# Patient Record
Sex: Male | Born: 1960 | Race: White | Hispanic: No | Smoking: Former smoker
Health system: Southern US, Community
[De-identification: ages and names within clinical notes are randomized; demographics above are authoritative.]

## PROBLEM LIST (undated history)

## (undated) DIAGNOSIS — R0602 Shortness of breath: Secondary | ICD-10-CM

## (undated) DIAGNOSIS — E785 Hyperlipidemia, unspecified: Secondary | ICD-10-CM

## (undated) DIAGNOSIS — I209 Angina pectoris, unspecified: Secondary | ICD-10-CM

## (undated) DIAGNOSIS — M1990 Unspecified osteoarthritis, unspecified site: Secondary | ICD-10-CM

## (undated) DIAGNOSIS — G8929 Other chronic pain: Secondary | ICD-10-CM

## (undated) DIAGNOSIS — M545 Low back pain, unspecified: Secondary | ICD-10-CM

## (undated) DIAGNOSIS — N183 Chronic kidney disease, stage 3 unspecified: Secondary | ICD-10-CM

## (undated) DIAGNOSIS — Z8719 Personal history of other diseases of the digestive system: Secondary | ICD-10-CM

## (undated) DIAGNOSIS — K219 Gastro-esophageal reflux disease without esophagitis: Secondary | ICD-10-CM

## (undated) DIAGNOSIS — I10 Essential (primary) hypertension: Secondary | ICD-10-CM

## (undated) DIAGNOSIS — E119 Type 2 diabetes mellitus without complications: Secondary | ICD-10-CM

## (undated) DIAGNOSIS — R51 Headache: Secondary | ICD-10-CM

## (undated) HISTORY — PX: SHOULDER SURGERY: SHX246

## (undated) HISTORY — PX: ELBOW ARTHROPLASTY: SHX928

## (undated) HISTORY — PX: LUMBAR DISC SURGERY: SHX700

---

## 1998-01-24 ENCOUNTER — Inpatient Hospital Stay (HOSPITAL_COMMUNITY): Admission: EM | Admit: 1998-01-24 | Discharge: 1998-01-25 | Payer: Self-pay | Admitting: Emergency Medicine

## 1999-01-30 ENCOUNTER — Encounter: Payer: Self-pay | Admitting: Emergency Medicine

## 1999-01-30 ENCOUNTER — Emergency Department (HOSPITAL_COMMUNITY): Admission: EM | Admit: 1999-01-30 | Discharge: 1999-01-30 | Payer: Self-pay | Admitting: Emergency Medicine

## 1999-07-07 ENCOUNTER — Encounter: Payer: Self-pay | Admitting: Emergency Medicine

## 1999-07-07 ENCOUNTER — Emergency Department (HOSPITAL_COMMUNITY): Admission: EM | Admit: 1999-07-07 | Discharge: 1999-07-07 | Payer: Self-pay | Admitting: Emergency Medicine

## 1999-07-14 ENCOUNTER — Emergency Department (HOSPITAL_COMMUNITY): Admission: EM | Admit: 1999-07-14 | Discharge: 1999-07-14 | Payer: Self-pay | Admitting: Emergency Medicine

## 1999-07-15 ENCOUNTER — Encounter: Payer: Self-pay | Admitting: Emergency Medicine

## 1999-07-26 ENCOUNTER — Ambulatory Visit (HOSPITAL_COMMUNITY): Admission: RE | Admit: 1999-07-26 | Discharge: 1999-07-26 | Payer: Self-pay | Admitting: Gastroenterology

## 1999-07-26 ENCOUNTER — Encounter (INDEPENDENT_AMBULATORY_CARE_PROVIDER_SITE_OTHER): Payer: Self-pay | Admitting: Specialist

## 1999-08-02 ENCOUNTER — Inpatient Hospital Stay (HOSPITAL_COMMUNITY): Admission: EM | Admit: 1999-08-02 | Discharge: 1999-08-03 | Payer: Self-pay | Admitting: *Deleted

## 1999-08-02 ENCOUNTER — Encounter: Payer: Self-pay | Admitting: *Deleted

## 1999-08-08 ENCOUNTER — Encounter: Admission: RE | Admit: 1999-08-08 | Discharge: 1999-08-08 | Payer: Self-pay | Admitting: Family Medicine

## 1999-12-10 ENCOUNTER — Ambulatory Visit (HOSPITAL_COMMUNITY): Admission: RE | Admit: 1999-12-10 | Discharge: 1999-12-10 | Payer: Self-pay | Admitting: Gastroenterology

## 1999-12-17 ENCOUNTER — Encounter: Payer: Self-pay | Admitting: Emergency Medicine

## 1999-12-17 ENCOUNTER — Observation Stay (HOSPITAL_COMMUNITY): Admission: EM | Admit: 1999-12-17 | Discharge: 1999-12-18 | Payer: Self-pay | Admitting: Emergency Medicine

## 2001-09-10 ENCOUNTER — Emergency Department (HOSPITAL_COMMUNITY): Admission: EM | Admit: 2001-09-10 | Discharge: 2001-09-10 | Payer: Self-pay | Admitting: Emergency Medicine

## 2001-09-10 ENCOUNTER — Encounter: Payer: Self-pay | Admitting: Emergency Medicine

## 2001-12-06 ENCOUNTER — Encounter: Payer: Self-pay | Admitting: *Deleted

## 2001-12-06 ENCOUNTER — Emergency Department (HOSPITAL_COMMUNITY): Admission: EM | Admit: 2001-12-06 | Discharge: 2001-12-06 | Payer: Self-pay | Admitting: *Deleted

## 2003-04-18 ENCOUNTER — Emergency Department (HOSPITAL_COMMUNITY): Admission: EM | Admit: 2003-04-18 | Discharge: 2003-04-18 | Payer: Self-pay | Admitting: *Deleted

## 2003-04-18 ENCOUNTER — Encounter: Payer: Self-pay | Admitting: *Deleted

## 2003-04-19 ENCOUNTER — Encounter: Payer: Self-pay | Admitting: Emergency Medicine

## 2003-04-19 ENCOUNTER — Ambulatory Visit (HOSPITAL_COMMUNITY): Admission: RE | Admit: 2003-04-19 | Discharge: 2003-04-19 | Payer: Self-pay | Admitting: Emergency Medicine

## 2003-04-25 ENCOUNTER — Encounter: Payer: Self-pay | Admitting: Emergency Medicine

## 2003-04-25 ENCOUNTER — Observation Stay (HOSPITAL_COMMUNITY): Admission: EM | Admit: 2003-04-25 | Discharge: 2003-04-26 | Payer: Self-pay | Admitting: Emergency Medicine

## 2003-09-27 ENCOUNTER — Inpatient Hospital Stay (HOSPITAL_COMMUNITY): Admission: EM | Admit: 2003-09-27 | Discharge: 2003-09-28 | Payer: Self-pay | Admitting: Internal Medicine

## 2003-10-03 ENCOUNTER — Emergency Department (HOSPITAL_COMMUNITY): Admission: EM | Admit: 2003-10-03 | Discharge: 2003-10-03 | Payer: Self-pay | Admitting: Emergency Medicine

## 2003-11-28 ENCOUNTER — Emergency Department (HOSPITAL_COMMUNITY): Admission: EM | Admit: 2003-11-28 | Discharge: 2003-11-29 | Payer: Self-pay | Admitting: *Deleted

## 2004-02-07 ENCOUNTER — Inpatient Hospital Stay (HOSPITAL_COMMUNITY): Admission: EM | Admit: 2004-02-07 | Discharge: 2004-02-09 | Payer: Self-pay | Admitting: *Deleted

## 2004-11-20 ENCOUNTER — Encounter: Admission: RE | Admit: 2004-11-20 | Discharge: 2004-12-13 | Payer: Self-pay | Admitting: Orthopedic Surgery

## 2004-12-19 ENCOUNTER — Ambulatory Visit: Payer: Self-pay | Admitting: Cardiology

## 2005-01-03 ENCOUNTER — Ambulatory Visit: Payer: Self-pay | Admitting: Cardiology

## 2005-01-11 ENCOUNTER — Inpatient Hospital Stay (HOSPITAL_COMMUNITY): Admission: AD | Admit: 2005-01-11 | Discharge: 2005-01-15 | Payer: Self-pay | Admitting: Cardiology

## 2005-01-11 ENCOUNTER — Ambulatory Visit: Payer: Self-pay | Admitting: *Deleted

## 2005-01-29 ENCOUNTER — Ambulatory Visit: Payer: Self-pay | Admitting: Cardiology

## 2006-02-06 ENCOUNTER — Ambulatory Visit: Payer: Self-pay | Admitting: Cardiology

## 2006-09-23 ENCOUNTER — Encounter: Admission: RE | Admit: 2006-09-23 | Discharge: 2006-11-05 | Payer: Self-pay | Admitting: Orthopedic Surgery

## 2007-02-06 ENCOUNTER — Ambulatory Visit: Payer: Self-pay | Admitting: Cardiology

## 2007-12-07 ENCOUNTER — Ambulatory Visit: Payer: Self-pay | Admitting: Cardiology

## 2009-10-31 ENCOUNTER — Encounter: Admission: RE | Admit: 2009-10-31 | Discharge: 2010-01-29 | Payer: Self-pay | Admitting: Family Medicine

## 2010-02-08 ENCOUNTER — Observation Stay (HOSPITAL_COMMUNITY): Admission: RE | Admit: 2010-02-08 | Discharge: 2010-02-09 | Payer: Self-pay | Admitting: Specialist

## 2010-02-26 ENCOUNTER — Encounter: Admission: RE | Admit: 2010-02-26 | Discharge: 2010-05-27 | Payer: Self-pay | Admitting: Specialist

## 2010-07-18 ENCOUNTER — Ambulatory Visit: Payer: Self-pay | Admitting: Cardiology

## 2010-09-26 ENCOUNTER — Observation Stay (HOSPITAL_COMMUNITY)
Admission: RE | Admit: 2010-09-26 | Discharge: 2010-09-27 | Payer: Self-pay | Source: Home / Self Care | Attending: Specialist | Admitting: Specialist

## 2010-09-27 ENCOUNTER — Encounter (INDEPENDENT_AMBULATORY_CARE_PROVIDER_SITE_OTHER): Payer: Self-pay | Admitting: Specialist

## 2010-12-25 LAB — CBC
HCT: 38.1 % — ABNORMAL LOW (ref 39.0–52.0)
Hemoglobin: 13.4 g/dL (ref 13.0–17.0)
MCH: 31 pg (ref 26.0–34.0)
MCHC: 35.2 g/dL (ref 30.0–36.0)
MCV: 88.2 fL (ref 78.0–100.0)
RDW: 12.6 % (ref 11.5–15.5)

## 2010-12-25 LAB — URINALYSIS, ROUTINE W REFLEX MICROSCOPIC
Bilirubin Urine: NEGATIVE
Glucose, UA: NEGATIVE mg/dL
Hgb urine dipstick: NEGATIVE
Ketones, ur: NEGATIVE mg/dL
Protein, ur: NEGATIVE mg/dL
pH: 6 (ref 5.0–8.0)

## 2010-12-25 LAB — COMPREHENSIVE METABOLIC PANEL
ALT: 37 U/L (ref 0–53)
BUN: 16 mg/dL (ref 6–23)
CO2: 26 mEq/L (ref 19–32)
Calcium: 9.4 mg/dL (ref 8.4–10.5)
GFR calc non Af Amer: >60 mL/min (ref >60)
Glucose, Bld: 123 mg/dL — ABNORMAL HIGH (ref 70–99)
Total Protein: 7.1 g/dL (ref 6.0–8.3)

## 2010-12-25 LAB — PROTIME-INR
INR: 0.96 (ref 0.00–1.49)
Prothrombin Time: 13 seconds (ref 11.6–15.2)

## 2011-01-01 LAB — URINALYSIS, ROUTINE W REFLEX MICROSCOPIC
Nitrite: NEGATIVE
Specific Gravity, Urine: 1.024 (ref 1.005–1.030)
pH: 7 (ref 5.0–8.0)

## 2011-01-01 LAB — COMPREHENSIVE METABOLIC PANEL
AST: 22 U/L (ref 0–37)
Albumin: 3.8 g/dL (ref 3.5–5.2)
Calcium: 9.2 mg/dL (ref 8.4–10.5)
Chloride: 107 mEq/L (ref 96–112)
Creatinine, Ser: 0.84 mg/dL (ref 0.4–1.5)
GFR calc Af Amer: >60 mL/min (ref >60)
Sodium: 140 mEq/L (ref 135–145)
Total Bilirubin: 0.6 mg/dL (ref 0.3–1.2)

## 2011-01-01 LAB — BASIC METABOLIC PANEL
GFR calc Af Amer: >60 mL/min (ref >60)
GFR calc non Af Amer: >60 mL/min (ref >60)
Potassium: 4.4 mEq/L (ref 3.5–5.1)
Sodium: 138 mEq/L (ref 135–145)

## 2011-01-01 LAB — CBC
HCT: 36.1 % — ABNORMAL LOW (ref 39.0–52.0)
Hemoglobin: 12.3 g/dL — ABNORMAL LOW (ref 13.0–17.0)
MCV: 91.5 fL (ref 78.0–100.0)
Platelets: 211 10*3/uL (ref 150–400)
Platelets: 242 10*3/uL (ref 150–400)
WBC: 16.5 10*3/uL — ABNORMAL HIGH (ref 4.0–10.5)
WBC: 7.9 10*3/uL (ref 4.0–10.5)

## 2011-01-01 LAB — APTT: aPTT: 30 seconds (ref 24–37)

## 2011-01-30 NOTE — Discharge Summary (Signed)
  [name], [name]             ACCOUNT NO.:  [id number]  MEDICAL RECORD NO.:  [id number]          PATIENT TYPE:  OBV  LOCATION:  1605                         FACILITY:  [facility]  PHYSICIAN:  Jene Every, M.D.    DATE OF BIRTH:  1961/06/21  DATE OF ADMISSION:  09/26/2010 DATE OF DISCHARGE:  09/27/2010                              DISCHARGE SUMMARY   ADMISSION DIAGNOSIS:  Recurrent spinal stenosis, L5-S1, with foraminal cyst.  DISCHARGE DIAGNOSIS:  Recurrent spinal stenosis, L5-S1, with foraminal cyst, status post redo decompression at L5-S1 with removal of synovial cyst.  PROCEDURE:  The patient was taken to OR, underwent redo decompression at L5-S1, left with foraminotomy at L5-S1, decompression of synovial cyst at L5-S1 with foraminotomy of L5.  SURGEON:  Jene Every, M.D.  ASSISTANT:  Roma Schanz, P.A.-C.  ANESTHESIA:  General.  COMPLICATIONS:  None.  HOSPITAL COURSE:  Uneventful.  FOLLOWUP:  The patient follow up with Dr. Shelle Iron in approximately 10-14 days postoperatively.  ACTIVITIES:  Walk as tolerated using back precautions.  MEDICATIONS:  As per med rec sheet.  CONDITION ON DISCHARGE:  Stable.  FINAL DIAGNOSIS:  Doing well, status post recurrent redo decompression at L5-S1.     Roma Schanz, P.A.   ______________________________ Jene Every, M.D.    CS/MEDQ  D:  01/22/2011  T:  01/23/2011  Job:  [id number]  Electronically Signed by Roma Schanz P.A. on 01/23/2011 04:56:46 PM Electronically Signed by Jene Every M.D. on 01/30/2011 12:04:21 PM

## 2011-03-01 NOTE — Consult Note (Signed)
NAME:  [name], [name]                       ACCOUNT NO.:  [id number]   MEDICAL RECORD NO.:  [id number]                   PATIENT TYPE:  INP   LOCATION:  A206                                 FACILITY:  [facility]   PHYSICIAN:  Kofi A. Gerilyn Pilgrim, M.D.              DATE OF BIRTH:  Jan 06, 1961   DATE OF CONSULTATION:  02/08/2004  DATE OF DISCHARGE:                                   CONSULTATION   REASON FOR CONSULTATION:  Syncope.   IMPRESSION:  Syncopal episode, etiology unclear.  The time period is  suggestive of a seizure.  However, he has no other associated symptoms or  signs of a seizure.  Cardiovascular event is much less likely.  When it is  all said and done, this patient's evaluation may be unremarkable.   RECOMMENDATIONS:  Recommendations are that he have an MRI and EEG that have  been ordered and I agree with these.  I will follow up the results of these.   HISTORY OF PRESENT ILLNESS:  This is a 50 year old Caucasian male who  apparently has had problems with gastroesophageal reflux disease and chest  pain in the past.  He has been cathed in December 2004.  This was normal.  The patient apparently wakes up at 4 a.m. to go to work.  Yesterday he awoke  at his usual time of 4 a.m. and went to the kitchen and headed to the  bathroom.  He bent over to pick up an object on the floor and apparently he  passed out.  He regained consciousness several minutes later.  The time  period that elapsed was at least 30 minutes as he woke up around 4 a.m. and  when he regained consciousness, it was something to 5 a.m.  There was no  associated neurological symptoms such as focal numbness, weakness,  dysarthria, dysphagia, or headache.  He reports having some mild chest pain,  2/10, before passing out, and then coming to the ER, a 4/10.  He did report  having some dizziness afterwards and a mild double vision.  The patient  denies any oral trauma or urinary incontinence.   PAST MEDICAL  HISTORY:  1. Hypertension.  2. Gastroesophageal reflux disease.  3. Noncardiac chest pain.   ADMISSION MEDICATIONS:  Levitra, Benicar, Prilosec.   ALLERGIES:  TEQUIN.   SOCIAL HISTORY:  He is divorced.  Has no children.  Lives by himself.  Works  at [employer]  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  Apparently has a history of smoking.  Quit about eight years ago.  Also quit  drinking.  Has a remote history of cocaine and marijuana use.   REVIEW OF SYSTEMS:  The patient's mother apparently died 05-02-2024of this  year.  He apparently found the mother and has been thinking about this since  then.  He wondered if this may be contributed to his current problems.  No  swelling  of the legs are apparent.  No shortness of breath.  No GI symptoms.   FAMILY HISTORY:  Mother had two strokes.  Also has siblings with  hypertension and heart disease.   PHYSICAL EXAMINATION:  GENERAL:  Mild to moderately overweight gentleman, in  no acute distress.  VITAL SIGNS:  He is afebrile.  Current temperature is 98.2.  Pulse 72,  respirations 20, blood pressure 140/73.  HEENT:  Poor dentition.  NECK:  Supple.  LUNGS:  Clear to auscultation bilaterally.  CARDIOVASCULAR:  Normal S1 and S2.  ABDOMEN:  Soft.  EXTREMITIES:  No edema.  He has hammer toe appearance on the right toes.  NEUROLOGIC:  The patient is awake, alert.  He converses well.  His speech,  language, and cognition are normal.  Cranial nerves II-XII reveal that the  right pupil is approximately 5 mm, left is 4 mm.  Both are reactive to light  and accommodation.  The difference between both pupils seemed to disappear  when all the lights are turned on in the room.  Visual fields are intact.  Extraocular movements show full.  Facial muscle strength is normal.  Tongue  is midline.  Uvula is midline.  Shoulder shrug is normal.  Motor examination  shows normal tone, bulk and strength.  There is no pronator drift.  Coordination  is intact.  Reflexes are +2 and symmetric.  Plantar reflexes  are both downgoing.  Sensory exam normal to temperature, light touch and  double simultaneous stimulation.  Gait is normal.   LABORATORY DATA:  PH 7.4, pCO2 40, pO2 106, FiO2 28%.  D-dimer 0.22.  sodium  137, potassium 4.4, chloride 105, CO2 28, glucose 115, BUN 10, creatinine  0.9, calcium 9.4.  CPK 180, 151, and 148.   Thank you for this consultation.      ___________________________________________                                            Perlie Gold Gerilyn Pilgrim, M.D.   KAD/MEDQ  D:  02/08/2004  T:  02/08/2004  Job:  [id number]

## 2011-03-01 NOTE — H&P (Signed)
[name], [name]             ACCOUNT NO.:  [id number]   MEDICAL RECORD NO.:  [id number]          PATIENT TYPE:  INP   LOCATION:  3742                         FACILITY:  [facility]   PHYSICIAN:  Oklee Bing, M.D.  DATE OF BIRTH:  November 05, 1960   DATE OF ADMISSION:  01/11/2005  DATE OF DISCHARGE:                                HISTORY & PHYSICAL   REFERRING PHYSICIAN:   PRIMARY CARE PHYSICIAN:  Dr.  Lia Hopping.   PRIMARY CARDIOLOGIST:  Learta Codding, M.D. [facility]   HISTORY OF PRESENT ILLNESS:  A 50 year old gentleman with a long history of  intermittent chest discomfort, referred for coronary angiography due to  recurrent symptoms.  [name]'s history dates to at least 2001, when  [facility] records indicate an ER visit and admission for chest  discomfort.  Those records are not currently available for review.  He has  had subsequent ER visits and hospitalizations, both at [facility]  and at [facility], the most recent earlier this month.  A stress  Myoview study was equivocal with a moderate-sized inferior and inferobasilar  defect that was predominantly fixed; a small degree of reversibility was  appreciated.  This was questioned as attenuation artifact versus inferior  scarring and ischemia.  Since discharge, the patient reports intermittent  left anterior chest discomfort.  This is of moderate severity and has a  pressure-like quality.  There is no significant radiation.  There is  intermittently associated dyspnea but no diaphoresis.  There is no  relationship to exertion.  Nitroglycerin appears efficacious at times  and  at other times not.  He took two sublingual nitroglycerin today without  affect, prompting him to summon EMS.  In the Emergency Department at  [facility], EKG and exam were unremarkable, as were cardiac markers,  and basic laboratory studies.  He had been told by Dr. Andee Lineman that he would  require catheterization for  recurrent symptoms and was sent here for that  procedure.  The patient believes he has had coronary angiography in the  past, but so far I am unable to locate those records.   PAST MEDICAL HISTORY:  Is otherwise notable for:  1.  Mild hypertension that has been easily controlled with medication.  2.  GERD, controlled with a PPI.  3.  Hiatal hernia.   The patient has never had surgery.   His only admissions have been for chest discomfort.   CURRENT MEDICATIONS:  1.  Prilosec 40 mg q.d.  2.  Benicar 40 mg q.d.  3.  Sublingual nitroglycerin is used p.r.n.  4.  [name] has been told to avoid aspirin in the past, due to his GI      disease.   SOCIAL HISTORY:  Divorced, works in a Ha[employer].  No tobacco  nor alcohol use for the past 8-9 years.   FAMILY HISTORY:  Negative for coronary disease.   REVIEW OF SYSTEMS:  Occasional headaches, some arthritis most notably in the  left elbow,  problems with depression.   PHYSICAL EXAMINATION:  GENERAL:  A mildly overweight gentleman who  appears  relatively healthy and has a flat affect.  VITAL SIGNS:  Blood pressure 130/85, heart rate 75 and regular, respirations  18, temperature 97.4, O2 saturation 99% on room air.  HEENT: Anicteric sclerae; normal lids and conjunctiva; pupils equal, round,  react to light.  NECK:  No jugular venous distension; normal carotid upstrokes without  bruits.  ENDOCRINE:  No thyromegaly.  HEMATOPOIETIC:  No adenopathy.  SKIN:  No significant lesions.  CHEST:  Normal lung expansion; clear to auscultation.  CARDIAC:  Normal first and second heart sounds; no murmurs or gallops  appreciated.  ABDOMEN:  Soft and nontender; no organomegaly; normal bowel sounds without  bruits; no masses.  EXTREMITIES:  Normal distal pulses; no edema.  NEUROMUSCULAR:  Symmetric strength and tone; normal cranial nerves.  MUSCULOSKELETAL:  No joint deformities.   EKG: Normal sinus rhythm.  J point elevation within  normal limits. When  compared to a prior tracing, of December 19, 2004, the ST segment elevation is  less prominent at present.   LABORATORY STUDIES:  Unremarkable including CBC, chemistry profile, cardiac  markers, and BNP.   IMPRESSION:  [name] has longstanding chest discomfort, although he  notes that his current symptoms are somewhat different than those he has had  in the past.  He has a questionable response to sublingual nitroglycerin.  His risk of significant coronary disease is relatively low, especially if he  had a negative coronary angiogram a few years ago.  We will continue to seek  those records.   1.  Initial treatment will be with aspirin at low-dose and metoprolol.  2.  If he has recurrent symptoms, IV nitroglycerin and perhaps      anticoagulants will be added.  3.  A lipid profile will be obtained along with other basic laboratory      tests.  4.  We will continue serial cardiac markers and EKGs.  5.  Coronary angiography is planned for Monday.      RR/MEDQ  D:  01/11/2005  T:  01/11/2005  Job:  [id number]

## 2011-03-01 NOTE — H&P (Signed)
NAME:  Russell, Strickland                       ACCOUNT NO.:  [id number]   MEDICAL RECORD NO.:  [id number]                   PATIENT TYPE:  OBV   LOCATION:  3709                                 FACILITY:  [facility]   PHYSICIAN:  Colleen Can. Deborah Chalk, M.D.            DATE OF BIRTH:  06-17-1961   DATE OF ADMISSION:  04/25/2003  DATE OF DISCHARGE:                                HISTORY & PHYSICAL   HISTORY OF PRESENT ILLNESS:  [name] is a 50 year old white man who  is admitted to [facility] for further evaluation of chest pain.   The patient presented to the emergency department after experiencing an  episode of chest pain which occurred at work.  He is employed as a Clinical cytogeneticist.  While at work, he experienced the onset of chest pain.  This was  described as a substernal sharp discomfort.  It radiated through to his back  and posterior neck.  It was associated with dyspnea and nausea but no  diaphoresis.  He had no exacerbating or ameliorating factors.  The chest  pain appears to be unrelated to position, activity, meals, or respirations.  It lasted for several hours and ultimately prompted his visit to the  emergency department.  His chest pain is largely resolved at this time.   The patient has no past history of cardiac disease, including no history of  myocardial infarction, coronary artery disease, congestive heart failure, or  arrhythmia.  He reportedly underwent cardiac catheterization approximately  five years ago for chest pain, but believed that it did not demonstrate any  coronary artery disease.  He has a history of hypertension and is on  medication.  There is no history of hyperlipidemia, diabetes mellitus, or  smoking.  His father suffered from coronary artery disease beginning in his  3s.  The patient also has a history of gastroesophageal reflux.   ALLERGIES:  The patient is reportedly allergic to [facility].   MEDICATIONS:  He takes Prilosec and an  unknown blood pressure medication.   SOCIAL HISTORY:  The patient is employed, as described above.  He lives with  his mother.  He is not married.   FAMILY HISTORY:  Family history is notable for the coronary artery disease  as described above.   REVIEW OF SYSTEMS:  Review of systems reveals no problems related to his  head, eyes, nose, mouth, throat, lungs, gastrointestinal system,  genitourinary system, or extremities.  There is no history of neurologic or  psychiatric disorder.  There is no history of fever, chills, or weight loss.   PHYSICAL EXAMINATION:  VITAL SIGNS:  Blood pressure 128/82.  Pulse 70 and  regular.  Respirations 20.  Temperature 97.8.  GENERAL:  The patient was an obese, middle-aged white man in no discomfort.  He was alert, oriented, appropriate, and responsive.  HEENT:  Head, eyes, nose, mouth, and throat were normal.  NECK:  The neck  was without thyromegaly or adenopathy.  Carotid pulses were  palpable bilaterally and without bruits.  CARDIAC:  Examination revealed a normal S1 and S2.  There was no S3, S4,  murmur, rub, or click.  Cardiac rhythm was regular.  No chest wall  tenderness was noted.  LUNGS:  The lungs were clear.  ABDOMEN:  The abdomen was soft and nontender.  There was no mass,  hepatosplenomegaly, bruit, distention, rebound, guarding, or rigidity.  Bowel sounds were normal.  RECTAL AND GENITAL:  Examinations were not performed as they were not  pertinent to the reason for acute care hospitalization.  EXTREMITIES:  The extremities were without edema, deviation, or deformity.  Radial and dorsalis pedal pulses were palpable bilaterally.  NEUROLOGIC:  Brief screening neurologic survey was unremarkable.   LABORATORY AND ACCESSORY CLINICAL DATA:  The electrocardiogram revealed  sinus bradycardia at a rate of 59 beats per minute.  An early repolarization  pattern was present.  The tracing was normal.   BUN was 16, creatinine 0.8, and potassium 4.1.   White count was 13.6 with a  hemoglobin of 13.6 and hematocrit of 38.3.  Initial CK was 604 with CK-MB of  6.4, relative index 1.1, and troponin 0.01.  The remaining studies were  pending at the time of this dictation.   IMPRESSION:  1. Chest pain, rule out coronary artery disease.  The chest pain was     described as sharp, substernal in location, and radiating through to the     back and posterior neck.  He reportedly underwent cardiac catheterization     approximately five years ago which did not demonstrate coronary artery     disease.  2. Hypertension.  3. Gastroesophageal reflux.   PLAN:  1. Telemetry.  2. Serial cardiac enzymes.  3. Aspirin.  4. Nitrol paste.  5. Lovenox.  6. Fasting lipid profile.  7. Chest and abdominal CT scans to exclude aortic aneurysm.  8. Additional evaluation per Dr. Colleen Can. Tenn[place].     Quita Skye. Waldon Reining, MD                   Colleen Can. Deborah Chalk, M.D.    MSC/MEDQ  D:  04/25/2003  T:  04/25/2003  Job:  [id number]

## 2011-03-01 NOTE — H&P (Signed)
NAME:  [name], [name]                       ACCOUNT NO.:  [id number]   MEDICAL RECORD NO.:  [id number]                   PATIENT TYPE:  INP   LOCATION:  A206                                 FACILITY:  [facility]   PHYSICIAN:  Vania Rea, M.D.              DATE OF BIRTH:  Mar 31, 1961   DATE OF ADMISSION:  02/07/2004  DATE OF DISCHARGE:                                HISTORY & PHYSICAL   PRIMARY CARE PHYSICIAN:  Dr. Lia Hopping.   CHIEF COMPLAINT:  Fainting and chest pain this morning.   HISTORY OF PRESENT ILLNESS:  This is a 50 year old Caucasian gentleman with  a history of hypertension, GERD, obesity, questionable history of  depression, who had a normal cardiac catheterization in December 2004 for  chest pain.  The patient was in his baseline state of health but woke up at  about 4 o'clock this morning, his usual time, and did not feel quite right,  had a burning sensation in his chest, 2/10 in intensity, nevertheless, went  to the kitchen, got coffee and came back about 5 minutes later and  apparently fainted, said when he woke up he looked at the clock and it was  4:40, so he feels he was unconscious for about 35 minutes.  When he woke up,  he still did not feel quite right.  The chest discomfort was still there,  still about 2/10, and not pressing or radiating; no sweating or shortness of  breath, but just discomfort.  The patient, nevertheless, got into his truck  and started to drive to work from [place].  On the way to work,  he felt progressively unwell and decided to turn around and come to the  emergency room here at [facility].  On arrival at [facility], he said he  continued to feel sick and the pain was now a 4/10.  In the emergency room,  he received sublingual nitroglycerin and it relieved the pain.   The patient uses Levitra but the last use was about 1 or 2 months ago.  The  patient takes his blood pressure medications regularly but did not take  them  this morning.  He denies any history of dizziness prior to this incident or  any incidence of sweating.  He had no fever, cough or cold; no frequency or  dysuria.   He does not usually have chest pains or shortness of breath.  He usually  does fair physical work and in fact, was in an exercise program up to about  a month ago and used to walk 4 miles very easily.   PAST MEDICAL HISTORY:  1. Hypertension.  2. Possible GERD.  3. Obesity.  4. History of noncardiac chest pain, MI ruled out by cardiac cath in     December 2004.   MEDICATIONS:  1. Prilosec 40 mg daily.  2. Benicar 40 mg daily.  3. Levitra episodically.  ALLERGIES:  TEQUIN.   SOCIAL HISTORY:  He is divorced with no children, lives alone, works at  [employer], says he does a lot of heavy lifting.  He quit smoking and drinking about 8 years ago but used to do that very  heavily when he did it.  Very remote history of marijuana and cocaine use.   FAMILY HISTORY:  His mother died on 02-11-04 and he was the one who  found her in her home and he is wondering if the stress of that could be  having an effect on him now.  His mother, before she died, had diabetes  mellitus, hypertension and she had a stroke twice; his mother was age 47 at  death.  His father died at the age of 31 from a blood clot, source unknown  and location unknown.  He has 6 siblings with hypertension and heart disease  but he is not sure who has what.   REVIEW OF SYSTEMS:  Denies headache except as a reaction to the  nitroglycerin he received in the emergency room.  Denies any focal weakness  or any history of fainting other than as described in the HPI.  No glaucoma  or cataracts, but he does wear glasses.  No dental problems.  No thyroid  problems or diabetes.  He does not usually have chest pain, shortness of  breath or swelling of the legs.  No orthopnea or PND.  No frequency, dysuria  or hematuria.  No nausea,  vomiting or diarrhea.  He occasionally has rectal  burning which is relieved by Preparation H, as advised by his primary care  physician.   PHYSICAL EXAM:  GENERAL:  This is an apprehensive-looking middle-aged  Caucasian man lying in bed, very well-nourished and well-set.  VITALS:  His temperature is 97.0.  His blood pressure is 101/56; he has not  taken his blood pressure medication this morning.  His pulse as we interview  him falls as low at 44, ranges from 44-56.  He is experiencing no pain.  He  is saturating at 97% on room air and 100% on 2 L.  HEENT:  His pupils are 3-4 mm, round, equal and reactive to light.  His  mucous membranes are pink.  He is anicteric.  He is not dehydrated.  Oral  cavity:  There is no oral Candida.  There is no oral rash.  There are no  dental caries; his teeth are well-cared for.  NECK:  There is no lymphadenopathy and no jugular venous distention.  CHEST:  His chest is clear to auscultation bilaterally.  CARDIOVASCULAR:  His cardiovascular system is regular rhythm, no murmurs, no  gallops.  ABDOMEN:  His abdomen is obese, soft and nontender.  EXTREMITIES:  He has a trace edema bilaterally.  Pulses are 2+ bilaterally.  There are no joint deformities and no joint tenderness.  There is no muscle  wasting.  CENTRAL NERVOUS SYSTEM:  His cranial nerves are grossly intact.  There is no  focal deficit.  His reflexes are equal bilaterally.   LABORATORIES:  His CBC is entirely normal; his white count is 6.7, his  hemoglobin is 14.9, his platelet count is 247,000, his RDW is 13; his  differential is normal.  His serum chemistry is completely normal; his  sodium is 137, potassium 4.4, chloride 105, CO2 28, glucose 115, BUN 10,  creatinine 0.9, calcium 9.4.  His beta natriuretic peptide is less than 30.  His first  set of cardiac enzymes:  CK total is 180, MB is 2.1 and troponin  is 0.02.   ASSESSMENT: 1. Syncope of unclear etiology.  2. Bradycardia.  3. Chest  pain of unclear etiology.  4. Gastroesophageal reflux disease.  5. Hypertension, controlled.  6. Recent significant death in the family.   PLAN:  Since this gentleman has had a negative cath within the past year, we  will admit him and rule out an acute MI with 3 sets of cardiac enzymes.  We  will get a 2-D echo and put him on telemetry to see if there is any cardiac  irregularity.  It is possible that these symptoms are the result of a  bradycardia, but it is more likely that the bradycardia is related to his  physical conditioning.  He is not on any medication to cause bradycardia,  but he, however, may have some conduction abnormality; we will get a  cardiology consult.  We will continue to treat him for his GERD, which is a  likely cause of burning of the stomach.  His pressure is rather low for  somebody who has not taken his blood pressure medication this morning.  We  will get orthostatics on him and be sure he is not having orthostasis as a  cause of his syncope, although it is unusual to be unconscious for 30  minutes as a result of orthostasis.     ___________________________________________                                         Vania Rea, M.D.   LC/MEDQ  D:  02/07/2004  T:  02/07/2004  Job:  [id number]

## 2011-03-01 NOTE — Discharge Summary (Signed)
NAME:  [name], [name]                       ACCOUNT NO.:  [id number]   MEDICAL RECORD NO.:  [id number]                   PATIENT TYPE:  OBV   LOCATION:  3709                                 FACILITY:  [facility]   PHYSICIAN:  Colleen Can. Deborah Chalk, M.D.            DATE OF BIRTH:  1961/03/31   DATE OF ADMISSION:  04/24/2003  DATE OF DISCHARGE:  04/26/2003                                 DISCHARGE SUMMARY   PRIMARY DISCHARGE DIAGNOSIS:  Chest pain with negative cardiac  catheterization.   SECONDARY DIAGNOSES:  1. Hypertension, currently controlled.  2. Gastroesophageal reflux disease, currently on Prilosec.   HISTORY OF PRESENT ILLNESS:  The patient is a 50 year old white male who  presents to the emergency room for evaluation of chest pain. 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 It was described as sharp and substernal. It radiated into the  back as well as the posterior neck and was associated with shortness of  breath and nausea but no diaphoresis. The pain appeared to be unrelated to  position, activity, meals, or respirations and lasted for several hours. His  pain was primarily resolved by the time of evaluation. She was subsequently  seen and evaluated by Dr. Gladys Damme and was admitted for further  evaluation.   Please see dictated history and physical for further patient presentation  and profile.   LABORATORY DATA ON ADMISSION:  His peak cardiac MB was 6.4 with a CK total  of 604, troponin Is were all negative. CBC showed a white count of 13,000,  hemoglobin 13, hematocrit 38, platelets 259.   Chemistries were normal except for glucose of 120, SGOT slightly elevated at  41, SGPT was 42, magnesium was 2.2, INR was 1.1.   Total cholesterol was 173, triglycerides 116, HDL 40, and LDL of 110.   EKG showed no acute changes.   CT of the abdomen showed no evidence of abdominal aortic aneurysm or  dissection. The heart,  mediastinum, pulmonary vessels appeared to be normal.  There was no pericardial effusion, no pleural effusion. The lungs were  clear. There was no evidence of thoracic aortic aneurysm or dissection as  well. The bony thorax did show hypertrophic spurring but no acute fracture  or metastatic disease. There was no evidence of obstruction of the bowel. No  free fluid or air was seen within the pelvis.   HOSPITAL COURSE:  The patient was admitted. Cardiac enzymes were noted to be  slightly elevated. We proceeded on with repeat cardiac catheterization the  following day which was normal. That procedure was tolerated well without  any noted complications. He was subsequently transferred back to telemetry,  and today on April 26, 2003, he is doing well without complaint. His heart  rate did drop to 39 which was nonsustained while the patient was asleep. He  was asymptomatic when awakened. He is  felt to be a stable candidate for  discharge with further evaluation on an outpatient basis.   DISCHARGE CONDITION:  Stable.   DISCHARGE MEDICATIONS:  He is to continue with Prilosec as he was taking  before as well as blood pressure medicines. He may use Advil and Tylenol for  chest discomfort.   He is not to return to work until July 19, use an ice pack to the groin if  needed, and otherwise will have him follow up with his primary care  physician in approximately one to two weeks. He will see Dr. Deborah Chalk on an  as needed basis.     Juanell Fairly C. Earl Gala, N.P.                 Colleen Can. Deborah Chalk, M.D.    LCO/MEDQ  D:  04/26/2003  T:  04/26/2003  Job:  [id number]   cc:   [name], M.D.  [name], [name]    cc:   [name], M.D.  [place], [name]

## 2011-03-01 NOTE — Discharge Summary (Signed)
[name], [name]             ACCOUNT NO.:  [id number]   MEDICAL RECORD NO.:  [id number]          PATIENT TYPE:  INP   LOCATION:  3742                         FACILITY:  [facility]   PHYSICIAN:  Olga Millers, M.D. LHCDATE OF BIRTH:  [birth year]   DATE OF ADMISSION:  01/11/2005  DATE OF DISCHARGE:  01/15/2005                                 DISCHARGE SUMMARY   PROCEDURES:  1.  Cardiac catheterization.  2.  Coronary arteriogram.  3.  Left ventriculogram.   DISCHARGE DIAGNOSES:  1.  Chest pain, no significant coronary artery disease by catheterization.  2.  Hypertension.  3.  Bradycardia secondary to beta blockers.  4.  Status post lipid profile, with a total cholesterol of 129,      triglycerides 75, HDL 34, LDL 75.  5.  Gastroesophageal reflux disease symptoms.  6.  History of hiatal hernia.  7.  Remote history of alcohol and tobacco use, stopped almost 10 years ago.   HOSPITAL COURSE:  [name] is a 50 year old male with a history of  cardiac catheterization in 2004 for chest pain that showed no coronary  artery disease.  He had been evaluated by Dr. Andee Lineman for chest pain and was  referred for coronary angiography.   He continued to have intermittent chest pain, and a cardiac catheterization  was performed on January 15, 2005.  The cardiac catheterization showed no  coronary artery disease and a normal EF.   Postcatheterization, [name] was without chest pain or shortness of  breath.  His groin was without oozing or hematoma.  He is tentatively  considered stable for discharge on January 15, 2005 if his groin remains stable  with ambulation.   DISCHARGE INSTRUCTIONS:  1.  His activity level is to include no strenuous activity for two days.  2.  He is to call the office for problems with the catheterization site.  3.  He is to stick to a lowfat diet.  4.  He is to follow up with Dr. Andee Lineman on January 25, 2005 at 1:00 p.m.  5.  He is to follow up with his family physician  as needed.   DISCHARGE MEDICATIONS:  1.  Benicar 40 mg daily.  2.  Prilosec 40 mg daily.  3.  Coated aspirin 81 mg daily.      RB/MEDQ  D:  01/15/2005  T:  01/15/2005  Job:  [id number]   cc:   [facility]  [facility]   __________, Judie Petit.D.

## 2011-03-01 NOTE — Procedures (Signed)
[place]. [facility]  Patient:    [name], [name]                    MRN: [id number] Adm. Date:  [id number] Disc. Date: [id number] Attending:  Nelda Marseille CC:         Petra Kuba, M.D.             Monica Becton, M.D.             Lorne Skeens. Hoxworth, M.D.                           Procedure Report  PROCEDURE:  24-hour pH.  INDICATIONS:  Patient with atypical chest pain and upper tract symptoms.  Want o evaluate reflux in consideration of surgical options.  INFORMED CONSENT:  Consent was signed after risks, benefits, methods, and options were thoroughly discussed with [name] before the procedure, as well as in the office in the past.  DESCRIPTION OF PROCEDURE:  The pH probe was placed by the endoscopy nurse in the customary fashion, and the computer generated report was submitted to me for my  interpretation.  The patient had, based on the Johnson-DeMeester score, an overall normal amount of reflux based on a total score of 12.3 with normal being less than 22.  The only parameter that he exceeded the normal was in total episodes in 24-hour where he had 63.  Normal is less than 50.  His longest episode was 4.5 minutes with normal less than 9.2.  None were greater than 5 minutes.  Percent f time with reflux was 3% with normal being less than 4.2.  Percent of time reflux lying flat was 1.2, which is the upper limits of normal, and percent of time reflux upright was 3.6 with the percent being less than 6.3.  His symptoms of heartburn and eating did not coordinate with reflux.  However, some of his coughing and belching did seem to coordinate with some reflux episodes.  ASSESSMENT:  Essentially normal 24-hour pH with only a slightly greater than normal episodes of reflux, but no significant other findings.  PLAN:  Will re-discuss these findings and the manometry findings with Dr. Johna Sheriff and decide whether we should  continue to follow [name] clinically on medicines or proceed with surgical options. DD:  12/12/99 TD:  12/12/99 Job: [id number]

## 2011-03-01 NOTE — Procedures (Signed)
NAME:  [name], [name]                       ACCOUNT NO.:  [id number]   MEDICAL RECORD NO.:  [id number]                   PATIENT TYPE:  INP   LOCATION:  A206                                 FACILITY:  [facility]   PHYSICIAN:  Edward L. Juanetta Gosling, M.D.             DATE OF BIRTH:  [birth year]   DATE OF PROCEDURE:  DATE OF DISCHARGE:                                EKG INTERPRETATION   TIME:  February 08, 2004, at 5:01.   FINDINGS:  1. The rhythm is sinus bradycardia with a rate in the 40's.  2. Since the previous tracing of 15:21 on February 07, 2004, there are now     decreased R waves in V1, V2 and V3, and T wave inversion in V1 and V2, as     well as ST elevation in 2, 3 and aVF.  These all may indicate an acute     infarction, and clinical correlation is suggested.   IMPRESSION:  Abnormal electrocardiogram.      ___________________________________________                                            Oneal Deputy. Juanetta Gosling, M.D.   ELH/MEDQ  D:  02/08/2004  T:  02/09/2004  Job:  [id number]

## 2011-03-01 NOTE — Procedures (Signed)
[name]. [facility]  Patient:    [name], [name]                    MRN: [id number] Proc. Date: 12/10/99 Adm. Date:  [id number] Disc. Date: [id number] Attending:  Nelda Marseille CC:         Petra Kuba, M.D.             Monica Becton, M.D.             Lorne Skeens. Hoxworth, M.D.                           Procedure Report  PROCEDURE:  Manometry.  INDICATIONS:   Patient with a hiatal hernia ______ symptoms.  Want to evaluate or surgical options.  INFORMED CONSENT:  Consent was signed after risks, benefits, methods, and options were thoroughly discussed with [name] before the procedure on December 10, 1999, as well as in the office in the past.  DESCRIPTION OF PROCEDURE:  The patient manometry probe was placed by the endoscopy nurse in the customary fashion on December 10, 1999, and the computer generated a report, and it was submitted to me for my interpretation.  MANOMETRY REPORT: 1. Upper esophageal sphincter revealed normal pressures.  Slightly short duration,    but normal residual pressures in seemingly normal pharyngeal phase. 2. Esophageal body is pertinent for the lower part of the esophageal body had    normal pressures, slight short duration with normal peristalses and normal    velocity.  The upper part of the esophagus had overall normal pressures, again,    a slight duration, but normal peristalses, velocity, and the esophageal body had    normal wave forms. 3. Lower esophageal sphincter had normal pressures, normal residual pressures, nd    77% relaxation, which was adequate.  IMPRESSION:  Normal manometry, except for slight short durations on all wave forms.  PLAN:  Okay for surgical options, however, will discuss with Dr. Johna Sheriff, and please see 24-hour dictation for other workup and plans. DD:  12/12/99 TD:  12/12/99 Job: [id number]

## 2011-03-01 NOTE — Cardiovascular Report (Signed)
   NAME:  [name], [name]                       ACCOUNT NO.:  [id number]   MEDICAL RECORD NO.:  [id number]                   PATIENT TYPE:  OBV   LOCATION:  3709                                 FACILITY:  [facility]   PHYSICIAN:  Colleen Can. Deborah Chalk, M.D.            DATE OF BIRTH:  02/16/1961   DATE OF PROCEDURE:  04/25/2003  DATE OF DISCHARGE:  04/26/2003                              CARDIAC CATHETERIZATION   HISTORY:  [name] is a 50 year old male who presents with substernal  chest pain with dyspnea and nausea that occurred while at work.  It lasted  for several hours.  He had a slight rise in CK-MB, but troponins were  negative.   PROCEDURE:  Left heart catheterization with selective coronary angiography,  left ventricular angiography.   TYPE AND SITE OF ENTRY:  Percutaneous, right femoral artery. (Perclose not  appropriate because of side branch location).   CATHETERS:  6 French 4 curved Judkins right and left coronary catheters; 6  French pigtail ventriculographic catheter.   CONTRAST MATERIAL:  Omnipaque.   MEDICATIONS GIVEN PRIOR TO PROCEDURE:  Valium 10 mg p.o.   MEDICATIONS GIVEN DURING THE PROCEDURE:  Versed 3 mg IV.   COMMENTS:  The patient tolerated the procedure well.   HEMODYNAMIC DATA:  1. The aortic pressure was 130/70.  2. LV was 113/14.  3. LV aortic pressure 117/73.  4. There was no aortic valve gradient noted on pullback.   ANGIOGRAPHIC DATA:  The left ventricular angiogram was performed in the RAO  position.  Overall cardiac size and silhouette was normal.  The global left  ventricular ejection fraction was 55-60%.  The regional wall motion was  normal.  There was no mitral regurgitation.   CORONARY ARTERIES:  1. Right coronary artery:  The right coronary artery is a moderate size     dominant vessel.  It is normal.  2. Left main coronary artery:  Left main coronary artery is normal.  3. Left anterior descending wraps around the apex.  It is  normal.  4. Intermediate coronary:  There is a moderately large intermediate     coronary.  It is normal.  5. Left circumflex:  The left circumflex continues to the posterior lateral     wall.  It is normal.    OVERALL IMPRESSION:  1. Normal left ventricular systolic function.  2. Normal coronary arteries.                                               Colleen Can. Deborah Chalk, M.D.    SNT/MEDQ  D:  04/25/2003  T:  04/26/2003  Job:  [id number]

## 2011-03-01 NOTE — Procedures (Signed)
NAME:  GERMAN, MANKE                       ACCOUNT NO.:  [id number]   MEDICAL RECORD NO.:  [id number]                   PATIENT TYPE:  INP   LOCATION:  A206                                 FACILITY:  [facility]   PHYSICIAN:  [place] Bing, M.D.               DATE OF BIRTH:  1960/12/24   DATE OF PROCEDURE:  DATE OF DISCHARGE:                                  ECHOCARDIOGRAM   CLINICAL DATA:  A 50 year old gentleman with chest pain and syncope.   M-MODE:  1. Aorta 3.4.  2. Left atrium 4.3.  3. Septum 1.3.  4. Posterior wall 0.9.  5. LV diastole 5.4.  6. LV systole 3.9.   TECHNICAL QUALITY:  1. Adequate echocardiographic study.  2. Normal left atrium, right atrium and right ventricle.  3. Normal aortic valve.  4. Delicate mitral valve; flat coaptation without definite prolapse.  5. Normal tricuspid valve; trivial regurgitation.  6. Normal pulmonic valve.  7. Left ventricular size at the upper limit of normal; no LVH.  8. Normal regional and global LV systolic function.  9. Normal Doppler examination.      ___________________________________________                                            Crocker Bing, M.D.   RR/MEDQ  D:  02/07/2004  T:  02/07/2004  Job:  [id number]

## 2011-03-01 NOTE — Procedures (Signed)
NAME:  [name], [name]                       ACCOUNT NO.:  [id number]   MEDICAL RECORD NO.:  [id number]                   PATIENT TYPE:  INP   LOCATION:  A206                                 FACILITY:  [facility]   PHYSICIAN:  Kofi A. Gerilyn Pilgrim, M.D.              DATE OF BIRTH:  1961/04/21   DATE OF PROCEDURE:  DATE OF DISCHARGE:                                EEG INTERPRETATION   HISTORY:  This is a 50 year old gentleman who had a syncopal episode that is  suspicious for seizure.   ANALYSIS:  This is a 16-channel recording.  It is conducted for  approximately 20 minutes.  There is a posterior rhythm of 9-1/2 Hz  bilaterally which attenuates with eye opening.  There is Beta activity seen  un the frontal areas.  Awake and sleep activities are seen.  Stage 2 sleep  with K-complexes and sleep spindles are observed.  Photic stimulation and  hyperventilation does not elicit any abnormal responses.  There is no focal  slowing, lateralized slowing, or epileptiform activity seen.   IMPRESSION:  This is a normal recording of awake and sleep states.      ___________________________________________                                            Perlie Gold Gerilyn Pilgrim, M.D.   KAD/MEDQ  D:  02/09/2004  T:  02/09/2004  Job:  [id number]

## 2011-03-01 NOTE — Group Therapy Note (Signed)
NAME:  JOHNSON, ARIZOLA                       ACCOUNT NO.:  [id number]   MEDICAL RECORD NO.:  [id number]                   PATIENT TYPE:  INP   LOCATION:  A206                                 FACILITY:  [facility]   PHYSICIAN:  Vania Rea, M.D.              DATE OF BIRTH:  07-22-1961   DATE OF PROCEDURE:  02/08/2004  DATE OF DISCHARGE:                                   PROGRESS NOTE   SUBJECTIVE:  Still feels dizziness episodically, very concerned about his  fainting, very concerned that if he should be discharged and he is not able  to drive his whole life will fall apart.   OBJECTIVE:  Temperature 97.5, pulse 51, respirations 20, blood pressure  120/66.  Orthostatics:  It is noted that from sitting to standing his pulse  changes from 55 to 88 although there is no significant change in his  diastolic or systolic pressures and there is no associated dizziness.  HEENT:  His pupils are equal, round, and reactive to light.  His chest is  clear to auscultation.  His cardiovascular system is a regular rhythm with  no murmur.  His abdomen is soft and nontender.  His central nervous system  exam:  There is no cranial nerve deficit and no focal deficit.  His  echocardiogram has been read as normal.  The cardiology service has seen him  and has ruled any cardiac cause for his 20- to 35-minute syncope.  He has  had no arrhythmia on the monitor.  His ABG this morning is normal with a pH  of 7.4, a PO2 of 106, a PCO2 of 40, with 98% saturation on room air.  His  hemoglobin is 15 today; it was 14.9 yesterday.  There is therefore no  evidence of bleeding.   ASSESSMENT:  Syncope of unclear etiology.   PLAN:  We will get a neurology consult.  We will get an EEG before  discharging him.  This can be done in the morning because we will have to  stop him from driving unless we rule out seizures.  We will also get an MRI  for completeness.      ___________________________________________                                     Vania Rea, M.D.   LC/MEDQ  D:  02/08/2004  T:  02/08/2004  Job:  [id number]

## 2011-03-01 NOTE — Discharge Summary (Signed)
NAME:  [name], [name]                       ACCOUNT NO.:  [id number]   MEDICAL RECORD NO.:  [id number]                   PATIENT TYPE:  INP   LOCATION:  A206                                 FACILITY:  [facility]   PHYSICIAN:  Vania Rea, M.D.              DATE OF BIRTH:  [birth year]   DATE OF ADMISSION:  02/07/2004  DATE OF DISCHARGE:  02/09/2004                                 DISCHARGE SUMMARY   PRIMARY CARE PHYSICIAN:  Dr. _________________.   DISCHARGE DIAGNOSES:  1. Syncope of unclear etiology.  2. Bradycardia, probably physiologic.  3. Chest pain, myocardial infarction ruled out.  4. Gastroesophageal reflux disease.  5. Hypertension, controlled.   DISPOSITION:  Discharged home.   CONDITION ON DISCHARGE:  Stable.   DISCHARGE MEDICATIONS:  1. Benicar 40 mg daily.  2. Prilosec 40 mg daily.   HOSPITAL COURSE:  Please refer to the history and physical of February 07, 2004.  This is a 50 year old man who came to the emergency room with a vague  history of low-grade chest pain and discomfort, and a history of fainting  with unconsciousness lasting about 1/2 hour.  The patient was evaluated by  the cardiology service, had an echo, and no cardiac cause could be found for  this fainting episode.  Myocardial infarction was ruled out by enzymes and  EKG, and the patient had a normal catheterization less than a year ago.  The  patient was evaluated by the neurology service and had an EEG today.  The  patient has been walking around while in the hospital and has had no  syncopal episodes.  The patient has last changes in his pulse on sitting and  standing, up to 30 units change, however, the patient is asymptomatic and  may therefore have ____________.  The patient is being discharged with  advice that he is not to drive until the EEG has been read.   At the time of discharge, it is being noted for the first time the patient  requested certificate from the staff stating that he  could not attend work  because of his hospitalization, and one wonders if this is any way related  to this episode of syncope.   FOLLOWUP:  Follow up with primary care physician, Dr. _______________.  1. Follow up with neurologist, Dr. Gerilyn Pilgrim.   SPECIAL INSTRUCTIONS:  1. The patient is not to drive until he has seen the neurologist and he has     been cleared for driving.  2. The patient is to return to the emergency room for any further episodes     of syncope.     ___________________________________________                                         Vania Rea, M.D.   LC/MEDQ  D:  02/09/2004  T:  02/09/2004  Job:  [id number]

## 2011-03-01 NOTE — Cardiovascular Report (Signed)
Russell, Strickland             ACCOUNT NO.:  [id number]   MEDICAL RECORD NO.:  [id number]          PATIENT TYPE:  INP   LOCATION:  3742                         FACILITY:  [facility]   PHYSICIAN:  Carole Binning, M.D. LHCDATE OF BIRTH:  04-Jun-1961   DATE OF PROCEDURE:  01/15/2005  DATE OF DISCHARGE:                              CARDIAC CATHETERIZATION   PROCEDURE PERFORMED:  Left heart catheterization with coronary angiography  and left ventriculography.   INDICATIONS:  The patient is a 50 year old male who has had recurrent  episodes of substernal chest pain. A recent stress nuclear study was  equivocal for possibly inferior perfusion defect. Because of his recurrent  pain, he was admitted where he ruled out for myocardial infarction. He was  then referred for cardiac catheterization. Of note, he has had two prior  catheterizations in 1999 and 2004. Both of these showed normal coronary  arteries.   PROCEDURE NOTE:  A 6-French sheath was placed in the right femoral artery.  Coronary angiography was performed with standard Judkins 6-French catheters.  Left ventriculography was performed with an angled pigtail catheter.  Contrast was Omnipaque. There are no complications.   RESULTS:   HEMODYNAMIC RESULTS:  Left ventricular pressure 106/18, aortic pressure  100/72. There is no aortic valve gradient on catheter pullback.   LEFT VENTRICULOGRAM:  Wall motion is normal.  Ejection fraction estimated at  60%. There was no significant mitral regurgitation seen.   CORONARY ARTERIOGRAPHY (CODOMINANT):  1.  Left main is normal.  2.  Left anterior descending artery is a large vessel which curls the apex      and supplies the distal inferior apical wall. The LAD is normal.  3.  Left circumflex is a large codominant vessel. It gives rise to a large      branching first obtuse marginal, and a large second obtuse marginal. The      distal circumflex gives rise to 3 small posterolateral  branches. The      left circumflex and its branches are normal.  4.  Right coronary is a relatively small codominant vessel ending as a small-      to-normal-size posterior descending artery. The right coronary is      normal.   IMPRESSION:  1.  Normal left ventricular systolic function.  2.  Normal coronary disease.   RECOMMENDATION:  To evaluate for alternative etiologies for the patient's  chest pain.      MWP/MEDQ  D:  01/15/2005  T:  01/15/2005  Job:  [id number]   cc:   Annette Stable Hasanaj  [address]  [place]  [place] [address]  Fax: [phone]

## 2011-03-01 NOTE — Discharge Summary (Signed)
NAME:  [name], [name]                       ACCOUNT NO.:  [id number]   MEDICAL RECORD NO.:  [id number]                   PATIENT TYPE:  INP   LOCATION:  A206                                 FACILITY:  [facility]   PHYSICIAN:  Vania Rea, M.D.              DATE OF BIRTH:  November 26, 1960   DATE OF ADMISSION:  02/07/2004  DATE OF DISCHARGE:                                 DISCHARGE SUMMARY   ADDENDUM:  The patient feels fine, he has no complaints.  Temperature 97.6, pulse 78,  respirations 20, blood pressure 103/66.  His pupils are round, equal, and  reactive to light. He is not dehydrated. His chest to clear to auscultation  bilaterally. Cardiovascular system has a regular rhythm, no murmurs. His  abdomen is obese, soft, and nontender. His extremities reveal no edema, 3+  pulses bilaterally.   His serum chemistry is entirely normal with the exception of 133. His BUN is  15, creatinine 1.0, albumin 3.6.  His TSH is normal at 0.699.   ASSESSMENT:  Please see discharge summary.     ___________________________________________                                         Vania Rea, M.D.   LC/MEDQ  D:  02/09/2004  T:  02/09/2004  Job:  [id number]

## 2011-03-01 NOTE — Discharge Summary (Signed)
NAME:  [name], [name]                       ACCOUNT NO.:  [id number]   MEDICAL RECORD NO.:  [id number]                   PATIENT TYPE:  INP   LOCATION:  IC09                                 FACILITY:  [facility]   PHYSICIAN:  Hanley Hays. Dechurch, M.D.           DATE OF BIRTH:  1961/04/07   DATE OF ADMISSION:  09/27/2003  DATE OF DISCHARGE:  09/28/2003                                 DISCHARGE SUMMARY   DIAGNOSES:  1. Noncardiac chest pain.  2. Adjustment disorder/depression.  3. Gastroesophageal reflux.  4. Obesity.  5. Hypertension.   DISPOSITION:  The patient is discharged to home.   FOLLOWUP:  Follow up with Earnestine Leys December 21.  Appointment arranged.  Lexapro December 21.  Follow up with Dr. Olena Leatherwood in  one month as scheduled.   MEDICATIONS:  1. Prilosec over-the-counter 20 mg daily.  2. Lexapro 5 mg daily x6 days then increase to 10 mg.   DISCHARGE INSTRUCTIONS:  The patient is instructed to call with questions or  problems.  He is to continue his usual blood pressure medication.   HOSPITAL COURSE:  A 50 year old gentleman who presented to the emergency  room complaining of substernal chest pain that occurred while he was  watching a concert.  He had eaten an hour and a half prior.  He described it  as sharp and nonrelenting.  It apparently improved with nitroglycerin in the  emergency room.  The patient was admitted to the hospital for further  evaluation.  His troponin was less than 0.001.  His CPK was slightly  elevated, but his relative index was normal.  His EKG revealed no evidence  of acute ischemic changes.  Remaining laboratory data was unremarkable.  When the records were reviewed, it turns out the patient actually had a  cardiac catheterization in August 2004 with Dr. Logan Bores which revealed no  evidence of coronary artery disease.  This was the second normal  catheterization in the last five years.  Upon further discussion with the  patient,  he did admit to some vegetative symptoms.  He was somewhat evasive,  but it was felt he was most likely dealing with anxiety versus adjustment  versus depression.  Psychiatry consultation was obtained, and arrangements  were made for outpatient follow up, and he was instituted on Lexapro which  he was agreeable to.  He was clinically stable during his short hospital  stay.  He is being discharged to home in stable condition with the regimen  as noted above.  It is felt that he probably has a degree of reflux as well,  given that he has symptoms more than 2-3 times per week, and he was taking  over-the-counter Prilosec on a p.r.n. basis.  The inefficacy of this was  discussed with him.  Further treatment evaluation will be deferred to his  primary care physician.  He was advised to follow up with Dr. Olena Leatherwood in the  near future.  He stated he would make that appointment.   PAST MEDICAL HISTORY:  Noncontributory, though he does have several family  members who have problems with alcohol.   SOCIAL HISTORY:  He quit smoking and drinking five years prior.  He is  divorced.  He has no children.  He works at [employer].   MEDICATIONS:  1. Unknown blood pressure medication.  2. Occasional Prilosec.  3. Denies any over-the-counter medications.  4. No illicit drugs.   REVIEW OF SYSTEMS:  Other than above, he has noted a 20 pound weight loss.  Poor sleep initiation, frequent awakening, generally tired and hedonic.  Denies shortness of breath.  He does not exercise.  He denies any other GI  or GU complaints.  He does not have much in the way of social support  besides his sister-in-law.   PHYSICAL EXAMINATION:  GENERAL APPEARANCE:  Well-developed, slightly obese  white male with flat affect who is pleasant and cooperative.  VITAL SIGNS:  Blood pressure 129/73, pulse 76 and regular, respirations  unlabored.  NECK:  Supple.  No JVD, adenopathy or thyromegaly.  LUNGS:  Clear to  auscultation.  HEART:  Regular rate and rhythm.  No murmurs, rubs or gallops.  ABDOMEN:  Obese, soft, nontender without mass, hepatomegaly or pulsatile  mass.  EXTREMITIES:  Without cyanosis, clubbing or edema.  NEUROLOGICAL:  Otherwise, completely intact.  SKIN:  Without significant rash, lesion or breakdown.   ASSESSMENT/PLAN:  As noted above.  Should there be any questions, please do  not hesitate to call.     ___________________________________________                                         Hanley Hays. Dechurch, M.D.   FED/MEDQ  D:  09/28/2003  T:  09/28/2003  Job:  [id number]   cc:   Annette Stable Hasanaj  [address]  [place]  [place] [address]  Fax: [phone]

## 2012-10-19 ENCOUNTER — Other Ambulatory Visit (HOSPITAL_COMMUNITY): Payer: Self-pay | Admitting: Orthopedic Surgery

## 2012-10-19 ENCOUNTER — Ambulatory Visit (HOSPITAL_COMMUNITY)
Admission: RE | Admit: 2012-10-19 | Discharge: 2012-10-19 | Disposition: A | Discharge status: Home / Self Care | Payer: Medicare Other | Source: Ambulatory Visit | Attending: Orthopedic Surgery | Admitting: Orthopedic Surgery

## 2012-10-19 DIAGNOSIS — R52 Pain, unspecified: Secondary | ICD-10-CM

## 2012-10-19 DIAGNOSIS — M5030 Other cervical disc degeneration, unspecified cervical region: Secondary | ICD-10-CM | POA: Insufficient documentation

## 2012-11-18 ENCOUNTER — Other Ambulatory Visit: Payer: Self-pay | Admitting: Orthopaedic Surgery

## 2012-11-18 DIAGNOSIS — M79642 Pain in left hand: Secondary | ICD-10-CM

## 2012-11-18 DIAGNOSIS — R200 Anesthesia of skin: Secondary | ICD-10-CM

## 2012-11-18 DIAGNOSIS — R531 Weakness: Secondary | ICD-10-CM

## 2012-11-23 ENCOUNTER — Ambulatory Visit
Admission: RE | Admit: 2012-11-23 | Discharge: 2012-11-23 | Disposition: A | Discharge status: Home / Self Care | Payer: Medicare Other | Source: Ambulatory Visit | Attending: Orthopaedic Surgery | Admitting: Orthopaedic Surgery

## 2012-11-23 DIAGNOSIS — R200 Anesthesia of skin: Secondary | ICD-10-CM

## 2012-11-23 DIAGNOSIS — M79642 Pain in left hand: Secondary | ICD-10-CM

## 2012-11-23 DIAGNOSIS — R531 Weakness: Secondary | ICD-10-CM

## 2012-12-29 DIAGNOSIS — I209 Angina pectoris, unspecified: Secondary | ICD-10-CM

## 2012-12-29 HISTORY — DX: Angina pectoris, unspecified: I20.9

## 2012-12-30 ENCOUNTER — Encounter (HOSPITAL_COMMUNITY): Payer: Self-pay | Admitting: Cardiology

## 2012-12-30 ENCOUNTER — Observation Stay (HOSPITAL_COMMUNITY)
Admission: AD | Admit: 2012-12-30 | Discharge: 2012-12-31 | DRG: 287 | Disposition: A | Discharge status: Home / Self Care | Payer: Medicare Other | Source: Other Acute Inpatient Hospital | Attending: Cardiology | Admitting: Cardiology

## 2012-12-30 DIAGNOSIS — E119 Type 2 diabetes mellitus without complications: Secondary | ICD-10-CM | POA: Diagnosis present

## 2012-12-30 DIAGNOSIS — R072 Precordial pain: Principal | ICD-10-CM | POA: Diagnosis present

## 2012-12-30 DIAGNOSIS — Z6841 Body Mass Index (BMI) 40.0 and over, adult: Secondary | ICD-10-CM

## 2012-12-30 DIAGNOSIS — I10 Essential (primary) hypertension: Secondary | ICD-10-CM

## 2012-12-30 DIAGNOSIS — Z79899 Other long term (current) drug therapy: Secondary | ICD-10-CM

## 2012-12-30 DIAGNOSIS — G8929 Other chronic pain: Secondary | ICD-10-CM | POA: Diagnosis present

## 2012-12-30 DIAGNOSIS — Z7982 Long term (current) use of aspirin: Secondary | ICD-10-CM

## 2012-12-30 DIAGNOSIS — Q250 Patent ductus arteriosus: Secondary | ICD-10-CM | POA: Insufficient documentation

## 2012-12-30 DIAGNOSIS — E785 Hyperlipidemia, unspecified: Secondary | ICD-10-CM | POA: Insufficient documentation

## 2012-12-30 DIAGNOSIS — I129 Hypertensive chronic kidney disease with stage 1 through stage 4 chronic kidney disease, or unspecified chronic kidney disease: Secondary | ICD-10-CM | POA: Diagnosis present

## 2012-12-30 DIAGNOSIS — M549 Dorsalgia, unspecified: Secondary | ICD-10-CM | POA: Diagnosis present

## 2012-12-30 DIAGNOSIS — N183 Chronic kidney disease, stage 3 unspecified: Secondary | ICD-10-CM | POA: Diagnosis present

## 2012-12-30 DIAGNOSIS — Z8249 Family history of ischemic heart disease and other diseases of the circulatory system: Secondary | ICD-10-CM | POA: Insufficient documentation

## 2012-12-30 HISTORY — DX: Headache: R51

## 2012-12-30 HISTORY — DX: Chronic kidney disease, stage 3 (moderate): N18.3

## 2012-12-30 HISTORY — DX: Low back pain: M54.5

## 2012-12-30 HISTORY — DX: Personal history of other diseases of the digestive system: Z87.19

## 2012-12-30 HISTORY — DX: Gastro-esophageal reflux disease without esophagitis: K21.9

## 2012-12-30 HISTORY — DX: Essential (primary) hypertension: I10

## 2012-12-30 HISTORY — DX: Morbid (severe) obesity due to excess calories: E66.01

## 2012-12-30 HISTORY — DX: Shortness of breath: R06.02

## 2012-12-30 HISTORY — DX: Low back pain, unspecified: M54.50

## 2012-12-30 HISTORY — DX: Type 2 diabetes mellitus without complications: E11.9

## 2012-12-30 HISTORY — DX: Chronic kidney disease, stage 3 unspecified: N18.30

## 2012-12-30 HISTORY — DX: Other chronic pain: G89.29

## 2012-12-30 HISTORY — DX: Angina pectoris, unspecified: I20.9

## 2012-12-30 HISTORY — DX: Hyperlipidemia, unspecified: E78.5

## 2012-12-30 HISTORY — DX: Unspecified osteoarthritis, unspecified site: M19.90

## 2012-12-30 LAB — CBC
Hemoglobin: 11.9 g/dL — ABNORMAL LOW (ref 13.0–17.0)
Platelets: 223 10*3/uL (ref 150–400)
RBC: 3.94 MIL/uL — ABNORMAL LOW (ref 4.22–5.81)
WBC: 8.3 10*3/uL (ref 4.0–10.5)

## 2012-12-30 LAB — GLUCOSE, CAPILLARY: Glucose-Capillary: 127 mg/dL — ABNORMAL HIGH (ref 70–99)

## 2012-12-30 LAB — CREATININE, SERUM
Creatinine, Ser: 1.25 mg/dL (ref 0.50–1.35)
GFR calc non Af Amer: 64 mL/min — ABNORMAL LOW (ref >90)

## 2012-12-30 MED ORDER — HEPARIN SODIUM (PORCINE) 5000 UNIT/ML IJ SOLN
5000.0000 [IU] | Freq: Three times a day (TID) | INTRAMUSCULAR | Status: DC
  Administered 2012-12-30: 5000 [IU] via SUBCUTANEOUS
  Filled 2012-12-30 (×5): qty 1

## 2012-12-30 MED ORDER — SODIUM CHLORIDE 0.9 % IV SOLN: 250.0000 mL | INTRAVENOUS | Status: DC | PRN

## 2012-12-30 MED ORDER — SODIUM CHLORIDE 0.9 % IJ SOLN
3.0000 mL | Freq: Two times a day (BID) | INTRAMUSCULAR | Status: DC
  Administered 2012-12-30: 3 mL via INTRAVENOUS

## 2012-12-30 MED ORDER — SODIUM CHLORIDE 0.9 % IJ SOLN: 3.0000 mL | INTRAMUSCULAR | Status: DC | PRN

## 2012-12-30 MED ORDER — ATORVASTATIN CALCIUM 40 MG PO TABS
40.0000 mg | ORAL_TABLET | Freq: Every day | ORAL | Status: DC
  Filled 2012-12-30: qty 1

## 2012-12-30 MED ORDER — INSULIN ASPART 100 UNIT/ML ~~LOC~~ SOLN
0.0000 [IU] | Freq: Three times a day (TID) | SUBCUTANEOUS | Status: DC
  Administered 2012-12-31: 2 [IU] via SUBCUTANEOUS

## 2012-12-30 MED ORDER — FAMOTIDINE 20 MG PO TABS
20.0000 mg | ORAL_TABLET | Freq: Every day | ORAL | Status: DC
  Administered 2012-12-31: 20 mg via ORAL
  Filled 2012-12-30: qty 1

## 2012-12-30 MED ORDER — ASPIRIN EC 81 MG PO TBEC
81.0000 mg | DELAYED_RELEASE_TABLET | Freq: Every day | ORAL | Status: DC
  Filled 2012-12-30: qty 1

## 2012-12-30 MED ORDER — OMEGA-3-ACID ETHYL ESTERS 1 G PO CAPS
2.0000 g | ORAL_CAPSULE | Freq: Two times a day (BID) | ORAL | Status: DC
  Administered 2012-12-30 – 2012-12-31 (×2): 2 g via ORAL
  Filled 2012-12-30 (×3): qty 2

## 2012-12-30 MED ORDER — GLIPIZIDE 10 MG PO TABS
10.0000 mg | ORAL_TABLET | Freq: Two times a day (BID) | ORAL | Status: DC
  Filled 2012-12-30 (×2): qty 1

## 2012-12-30 MED ORDER — DOCUSATE SODIUM 100 MG PO CAPS
100.0000 mg | ORAL_CAPSULE | Freq: Two times a day (BID) | ORAL | Status: DC
Start: 2012-12-30 — End: 2012-12-31
  Administered 2012-12-30 – 2012-12-31 (×2): 100 mg via ORAL
  Filled 2012-12-30 (×3): qty 1

## 2012-12-30 MED ORDER — NITROGLYCERIN 0.4 MG SL SUBL: 0.4000 mg | SUBLINGUAL_TABLET | SUBLINGUAL | Status: DC | PRN

## 2012-12-30 MED ORDER — SODIUM CHLORIDE 0.9 % IV SOLN
INTRAVENOUS | Status: DC
  Administered 2012-12-30 – 2012-12-31 (×2): via INTRAVENOUS

## 2012-12-30 MED ORDER — ACETAMINOPHEN 325 MG PO TABS: 650.0000 mg | ORAL_TABLET | ORAL | Status: DC | PRN

## 2012-12-30 MED ORDER — ONDANSETRON HCL 4 MG/2ML IJ SOLN: 4.0000 mg | Freq: Four times a day (QID) | INTRAMUSCULAR | Status: DC | PRN

## 2012-12-30 NOTE — H&P (Signed)
Physician History and Physical    Patient ID: [name] MRN: [id number] DOB/AGE: 10-26-1960 52 y.o. Admit date: 12/30/2012  Primary Care Physician:Len Nyland MD Primary Cardiologist: Lewayne Bunting MD  HPI: [name] is a pleasant 52 year old white male with history of morbid obesity, diabetes mellitus type 2, hypercholesterolemia, and hypertension. He is referred by Dr. Andee Lineman for evaluation of chest pain. He was admitted on 12/29/2012 to [facility] in [place] with acute chest pain. He describes this pain as lower mid sternal sharp pain. It was localized. It was rated at 7/10. He had associated shortness of breath and cold chill. He took 4 aspirin and his pain resolved after one and a half hours. He does admit to some increased dyspnea on exertion. He has a history of chest pain and has had cardiac evaluation the past including a normal cardiac catheterization 2006. He had a normal dobutamine stress echo in 2009. In 2011 he had a lexiscan Myoview study which was normal with an ejection fraction of 52%. Patient was diagnosed with diabetes about 8 months ago. It has been difficult to control. He has had significant weight gain. He is on disability due to to chronic back pain. His evaluation at the outside hospital revealed normal cardiac enzymes and ECG. Given his multiple cardiac risk factors it was felt that he would be best evaluated with cardiac catheterization. He is currently pain-free.  Review of systems complete and found to be negative unless listed above  No past medical history on file.  No family history on file.  History   Social History  . Marital Status: Divorced    Spouse Name: N/A    Number of Children: N/A  . Years of Education: N/A   Occupational History  . Not on file.   Social History Main Topics  . Smoking status: Not on file  . Smokeless tobacco: Not on file  . Alcohol Use: Not on file  . Drug Use: Not on file  . Sexually Active: Not on file   Other  Topics Concern  . Not on file   Social History Narrative  . No narrative on file    No past surgical history on file.   Medications prior to admission: 1. Aspirin 81 mg daily 2. Olmesartan HCT 40/25 mg daily 3. Crestor 20 mg daily 4. Glipizide extended release 10 mg twice a day 5. Metformin 1000 mg twice a day 6. Lasix 20 mg daily 7. Lovaza 1 g 2 tablets twice daily 8. Ranitidine 150 mg daily None. Colace 200 mg daily 10. Vitamin C 500 mg daily   Medications at the outside hospital: Lasix and HCTZ were held. Cozaar was substituted for Olmesartan. Heparin 5000 units subcutaneous 3 times a day.  Physical Exam: Obese WM in NAD. BP 115/64, pulse 54 regular, afebrile. The patient is alert and oriented x 3.  The mood and affect are flat and.  The skin is warm and dry.  Color is normal.  The HEENT exam reveals that the sclera are nonicteric.  The mucous membranes are moist.  The carotids are 2+ without bruits.  There is no thyromegaly.  There is no JVD.  The lungs are clear.  The chest wall is non tender.  The heart exam reveals a regular rate with a normal S1 and S2.  There are no murmurs, gallops, or rubs.  The PMI is not displaced.   Abdominal exam is obese with good bowel sounds.  There is no guarding or rebound.  There is no hepatosplenomegaly or tenderness.  There are no masses.  Exam of the legs reveal no clubbing, cyanosis, or edema.  The legs are without rashes.  The distal pulses are intact.  Cranial nerves II - XII are intact.  Motor and sensory functions are intact.  The gait is normal.  Labs:   Lab Results  Component Value Date   WBC 7.9 09/20/2010   HGB 13.4 09/20/2010   HCT 38.1* 09/20/2010   MCV 88.2 09/20/2010   PLT 267 09/20/2010     Coagulation studies were normal.  Initial creatinine was 1.49. Repeat was 1.32. Glucose 144. BUN 25. Liver function studies were normal. Albumin 3.2. Total cholesterol 86, LDL 21, HDL 34, triglycerides 154. Cardiac enzymes were negative.  Magnesium 1.6.   Radiology: chest x-ray showed mild basilar atelectasis. Otherwise no active disease.  EKG:No ischemic changes.  ASSESSMENT AND PLAN:  1. Acute chest pain concerning for unstable angina. The patient has multiple cardiac risk factors including morbid obesity, diabetes mellitus, hypertension, hyperlipidemia, and family history of coronary disease. While his pain syndrome is atypical he did have fairly severe pain. We'll proceed with diagnostic cardiac catheterization in the morning. Given his renal insufficiency will hold his diuretics and ARB and hydrate overnight. The procedure and risks were reviewed including but not limited to death, myocardial infarction, stroke, arrythmias, bleeding, transfusion, emergency surgery, dye allergy, or renal dysfunction. The patient voices understanding and is agreeable to proceed.  2. Hypertension, controlled.  3. Diabetes mellitus type 2. Metformin will be held for cardiac catheterization for 48 hours.  4. Hyperlipidemia, controlled.  5. Chronic kidney disease stage III.  6. Morbid obesity.  7. Chronic disability due to back pain.  SignedTheron Arista [facility] 12/30/2012, 4:21 PM

## 2012-12-30 NOTE — Progress Notes (Signed)
Notified Trish of pts arrival

## 2012-12-31 ENCOUNTER — Other Ambulatory Visit: Payer: Self-pay

## 2012-12-31 ENCOUNTER — Encounter (HOSPITAL_COMMUNITY)
Admission: AD | Disposition: A | Discharge status: Home / Self Care | Payer: Self-pay | Source: Other Acute Inpatient Hospital | Attending: Cardiology

## 2012-12-31 ENCOUNTER — Ambulatory Visit (HOSPITAL_COMMUNITY): Admission: RE | Admit: 2012-12-31 | Payer: Medicare Other | Source: Ambulatory Visit | Admitting: Cardiovascular Disease

## 2012-12-31 DIAGNOSIS — R079 Chest pain, unspecified: Secondary | ICD-10-CM

## 2012-12-31 HISTORY — PX: LEFT HEART CATHETERIZATION WITH CORONARY ANGIOGRAM: SHX5451

## 2012-12-31 LAB — GLUCOSE, CAPILLARY
Glucose-Capillary: 133 mg/dL — ABNORMAL HIGH (ref 70–99)
Glucose-Capillary: 138 mg/dL — ABNORMAL HIGH (ref 70–99)

## 2012-12-31 LAB — BASIC METABOLIC PANEL
Calcium: 9.1 mg/dL (ref 8.4–10.5)
GFR calc Af Amer: 83 mL/min — ABNORMAL LOW (ref >90)
GFR calc non Af Amer: 72 mL/min — ABNORMAL LOW (ref >90)
Potassium: 4.6 mEq/L (ref 3.5–5.1)
Sodium: 138 mEq/L (ref 135–145)

## 2012-12-31 LAB — CBC
Hemoglobin: 11.9 g/dL — ABNORMAL LOW (ref 13.0–17.0)
MCH: 30 pg (ref 26.0–34.0)
MCHC: 34.9 g/dL (ref 30.0–36.0)
Platelets: 222 10*3/uL (ref 150–400)
RDW: 13.1 % (ref 11.5–15.5)

## 2012-12-31 SURGERY — LEFT HEART CATHETERIZATION WITH CORONARY ANGIOGRAM
Anesthesia: LOCAL

## 2012-12-31 MED ORDER — METFORMIN HCL 500 MG PO TABS: 1000.0000 mg | ORAL_TABLET | Freq: Two times a day (BID) | ORAL | Status: DC

## 2012-12-31 MED ORDER — ASPIRIN 81 MG PO CHEW
324.0000 mg | CHEWABLE_TABLET | ORAL | Status: DC
Start: 2013-01-01 — End: 2012-12-31
  Filled 2012-12-31: qty 4

## 2012-12-31 MED ORDER — ASPIRIN 81 MG PO CHEW
324.0000 mg | CHEWABLE_TABLET | ORAL | Status: AC
  Administered 2012-12-31: 324 mg via ORAL

## 2012-12-31 MED ORDER — SODIUM CHLORIDE 0.9 % IV SOLN
1.0000 mL/kg/h | INTRAVENOUS | Status: DC
Start: 2012-12-31 — End: 2012-12-31
  Administered 2012-12-31: 1 mL/kg/h via INTRAVENOUS

## 2012-12-31 MED ORDER — ACETAMINOPHEN 325 MG PO TABS: 650.0000 mg | ORAL_TABLET | ORAL | Status: DC | PRN

## 2012-12-31 NOTE — Interval H&P Note (Signed)
History and Physical Interval Note:  12/31/2012 7:53 AM  [name]  has presented today for surgery, with the diagnosis of Chest pain  The various methods of treatment have been discussed with the patient and family. After consideration of risks, benefits and other options for treatment, the patient has consented to  Procedure(s): LEFT HEART CATHETERIZATION WITH CORONARY ANGIOGRAM (N/A) as a surgical intervention .  The patient's history has been reviewed, patient examined, no change in status, stable for surgery.  I have reviewed the patient's chart and labs.  Questions were answered to the patient's satisfaction.     Tonny Bollman

## 2012-12-31 NOTE — CV Procedure (Signed)
   Cardiac Catheterization Procedure Note  Name: MORY HERRMAN MRN: [id number] DOB: 1960/11/16  Procedure: Left Heart Cath, Selective Coronary Angiography, LV angiography  Indication: Chest pain with multiple CV risk factors   Procedural Details: The right wrist was prepped, draped, and anesthetized with 1% lidocaine. Using the modified Seldinger technique, a 5 French sheath was introduced into the right radial artery. 3 mg of verapamil was administered through the sheath, weight-based unfractionated heparin was administered intravenously. Standard Judkins catheters were used for selective coronary angiography and left ventriculography. Catheter exchanges were performed over an exchange length guidewire. There were no immediate procedural complications. A TR band was used for radial hemostasis at the completion of the procedure.  The patient was transferred to the post catheterization recovery area for further monitoring.  Procedural Findings: Hemodynamics: AO 101/65 LV 101/18  Coronary angiography: Coronary dominance: right  Left mainstem: widely patent without obstructive disease.  Left anterior descending (LAD): The LAD is of large caliber. The vessel wraps around the left ventricular apex. There is no obstructive disease throughout.  Left circumflex (LCx): The left circumflex is large in caliber. There are 2 large intermediate branches. The more superior branch runs in the course of the diagonal. The AV groove circumflex supplies 2 small obtuse marginal branches. There is no occlusive disease throughout the  Right coronary artery (RCA): The RCA is dominant. It is smooth throughout its course. There is a small PDA present.  Left ventriculography: Left ventricular systolic function is normal, LVEF is estimated at 55-65%, there is no significant mitral regurgitation   Final Conclusions:   1. Widely patent coronary arteries 2. Normal left ventricular systolic  function  Recommendations: Suspect noncardiac chest pain. The patient will followup with his primary care physician going treatment of his diabetes and other cardiac risk factors. Followup with Dr. Andee Lineman as needed.  Tonny Bollman 12/31/2012, 8:33 AM

## 2012-12-31 NOTE — Discharge Summary (Signed)
CARDIOLOGY DISCHARGE SUMMARY   Patient ID: [name] MRN: [id number] DOB/AGE: [birth year] 52 y.o.  Admit date: 12/30/2012 Discharge date: 01/01/2013  Primary Discharge Diagnosis:     Precordial pain Secondary Discharge Diagnosis:    HTN (hypertension)   Diabetes mellitus type 2, noninsulin dependent   CKD (chronic kidney disease) stage 3, GFR 30-59 ml/min  Procedures: Left Heart Cath, Selective Coronary Angiography, LV angiography    Hospital Course: [name] is a 52 y.o. male with no history of CAD. He was seen by Dr Andee Lineman in [place] for chest pain. He has multiple cardiac risk factors and his symptoms were very concerning. Cardiac catheterization was recommended and he was transferred to [facility] for further evaluation and treatment.  He was monitored closely overnight. His blood sugars were managed with sliding scale insulin and his home medication. He had no further episodes of pain. He had mild anemia but this is felt possibly secondary to hydration precath. He was taken to the Cath Lab on 12/31/2012, results below. His pain was felt to be noncardiac and cardiac risk factor reduction was recommended.  Post-cath, he is ambulating without chest pain or shortness of breath and considered stable for discharge, to follow up as an outpatient.  Labs:   Lab Results  Component Value Date   WBC 7.2 12/31/2012   HGB 11.9* 12/31/2012   HCT 34.1* 12/31/2012   MCV 85.9 12/31/2012   PLT 222 12/31/2012     Recent Labs Lab 12/31/12 0503  NA 138  K 4.6  CL 103  CO2 26  BUN 20  CREATININE 1.13  CALCIUM 9.1  GLUCOSE 142*    Recent Labs  12/31/12 0503  INR 0.98   Cardiac Cath: 12/31/2012 Left mainstem: widely patent without obstructive disease.  Left anterior descending (LAD): The LAD is of large caliber. The vessel wraps around the left ventricular apex. There is no obstructive disease throughout.  Left circumflex (LCx): The left circumflex is large in caliber.  There are 2 large intermediate branches. The more superior branch runs in the course of the diagonal. The AV groove circumflex supplies 2 small obtuse marginal branches. There is no occlusive disease throughout the  Right coronary artery (RCA): The RCA is dominant. It is smooth throughout its course. There is a small PDA present.  Left ventriculography: Left ventricular systolic function is normal, LVEF is estimated at 55-65%, there is no significant mitral regurgitation  Final Conclusions:  1. Widely patent coronary arteries  2. Normal left ventricular systolic function  EKG: 31-Dec-2012 04:58:48 [facility] ROUTINE RECORD Normal sinus rhythm Normal ECG 44mm/s 25mm/mV 100Hz  8.0.1 12SL 241 HD CID: 1 Referred by: Unconfirmed Vent. rate 61 BPM PR interval 180 ms QRS duration 96 ms QT/QTc 376/378 ms P-R-T axes 73 38 45  FOLLOW UP PLANS AND APPOINTMENTS Allergies  Allergen Reactions  . Tequin (Gatifloxacin) Other (See Comments)    "my mouth broke out"  . Quinolones      Medication List    TAKE these medications       aspirin EC 81 MG tablet  Take 81 mg by mouth daily.     docusate sodium 100 MG capsule  Commonly known as:  COLACE  Take 100 mg by mouth 2 (two) times daily.     furosemide 20 MG tablet  Commonly known as:  LASIX  Take 20 mg by mouth daily.     glipiZIDE 10 MG tablet  Commonly known as:  GLUCOTROL  Take  10 mg by mouth 2 (two) times daily before a meal.     metFORMIN 500 MG tablet  Commonly known as:  GLUCOPHAGE  Take 2 tablets (1,000 mg total) by mouth 2 (two) times daily with a meal. HOLD for 48 hours, restart on 01/03/2013.     olmesartan-hydrochlorothiazide 40-25 MG per tablet  Commonly known as:  BENICAR HCT  Take 1 tablet by mouth daily.     omega-3 acid ethyl esters 1 G capsule  Commonly known as:  LOVAZA  Take 2 g by mouth 2 (two) times daily.     ranitidine 150 MG tablet  Commonly known as:  ZANTAC  Take 150 mg by mouth  daily.     rosuvastatin 20 MG tablet  Commonly known as:  CRESTOR  Take 20 mg by mouth daily.     VITAMIN C PO  Take 1 tablet by mouth daily.        Discharge Orders   Future Orders Complete By Expires     Diet - low sodium heart healthy  As directed     Diet Carb Modified  As directed     Increase activity slowly  As directed       Follow-up Information   Follow up with Peyton Bottoms, MD. (As needed)    Contact information:   [address] [place] [address] [phone]       Schedule an appointment as soon as possible for a visit with Josue Hector, MD.   Contact information:   [address] [facility] [address] [phone]       BRING ALL MEDICATIONS WITH YOU TO FOLLOW UP APPOINTMENTS  Time spent with patient to include physician time: 32 min Signed: Theodore Demark, PA-C 01/01/2013, 6:32 AM Co-Sign MD

## 2013-08-19 ENCOUNTER — Other Ambulatory Visit: Payer: Self-pay

## 2014-09-22 ENCOUNTER — Encounter (HOSPITAL_COMMUNITY): Payer: Self-pay | Admitting: Cardiovascular Disease

## 2016-09-27 ENCOUNTER — Encounter (INDEPENDENT_AMBULATORY_CARE_PROVIDER_SITE_OTHER): Payer: Self-pay | Admitting: Internal Medicine

## 2016-10-24 ENCOUNTER — Other Ambulatory Visit (INDEPENDENT_AMBULATORY_CARE_PROVIDER_SITE_OTHER): Payer: Self-pay | Admitting: Internal Medicine

## 2016-10-24 ENCOUNTER — Ambulatory Visit (INDEPENDENT_AMBULATORY_CARE_PROVIDER_SITE_OTHER): Payer: 59 | Admitting: Internal Medicine

## 2016-10-24 ENCOUNTER — Encounter (INDEPENDENT_AMBULATORY_CARE_PROVIDER_SITE_OTHER): Payer: Self-pay | Admitting: Internal Medicine

## 2016-10-24 ENCOUNTER — Encounter (INDEPENDENT_AMBULATORY_CARE_PROVIDER_SITE_OTHER): Payer: Self-pay | Admitting: *Deleted

## 2016-10-24 ENCOUNTER — Telehealth (INDEPENDENT_AMBULATORY_CARE_PROVIDER_SITE_OTHER): Payer: Self-pay | Admitting: *Deleted

## 2016-10-24 VITALS — BP 122/78 | HR 66 | Resp 18 | Ht 75.0 in | Wt 357.0 lb

## 2016-10-24 DIAGNOSIS — R195 Other fecal abnormalities: Secondary | ICD-10-CM

## 2016-10-24 MED ORDER — PEG 3350-KCL-NA BICARB-NACL 420 G PO SOLR: 4000.0000 mL | Freq: Once | ORAL | 0 refills | Status: AC

## 2016-10-24 NOTE — Telephone Encounter (Signed)
Patient needs trilyte 

## 2016-10-24 NOTE — Progress Notes (Addendum)
Subjective:    Patient ID: [name], male    DOB: 09-14-1961, 56 y.o.   MRN: [id number]  HPIReferred by Dr. Lysbeth GalasNyland for positive stool card last month. He states he has not seen any blood. Stools are brown in color. No melena. Occasionally acid reflux.  Appetite is good. No weight loss. No abdominal pain. No family hx of colon cancer. Last colonoscopy was 8 yrs ago for screening purposes and [facility].  There has been no change in his stools.  Takes Meloxicam for left carpal tunnel.  Diabetic x 3-4 yrs.     09/17/2016 H and H 12.8 and 37.7, MCV 86.    Review of Systems Past Medical History:  Diagnosis Date  . Anginal pain (HCC) 12/29/2012  . Chronic low back pain   . CKD (chronic kidney disease) stage 3, GFR 30-59 ml/min   . Diabetes mellitus type 2, noninsulin dependent (HCC)   . Exertional shortness of breath   . GERD (gastroesophageal reflux disease)   . H/O hiatal hernia   . Headache(784.0)    "not daily, not weekly, but very often" (12/30/2012)  . HTN (hypertension)   . Hyperlipidemia   . Morbid obesity (HCC)   . Osteoarthritis    "hands" (12/30/2012)    Past Surgical History:  Procedure Laterality Date  . ELBOW ARTHROPLASTY Left 1990's   "3 spurs; ?14 staples" (12/30/2012)  . LEFT HEART CATHETERIZATION WITH CORONARY ANGIOGRAM N/A 12/31/2012   Procedure: LEFT HEART CATHETERIZATION WITH CORONARY ANGIOGRAM;  Surgeon: Tonny BollmanMichael Cooper, MD;  [facility];  Service: Cardiovascular;  Laterality: N/A;  . LUMBAR DISC SURGERY  2010; 2011   "2 ruptured discs; 2 pinched nerves"    Allergies  Allergen Reactions  . Tequin [Gatifloxacin] Other (See Comments)    "my mouth broke out"  . Quinolones     Current Outpatient Prescriptions on File Prior to Visit  Medication Sig Dispense Refill  . Ascorbic Acid (VITAMIN C PO) Take 1 tablet by mouth daily.    Marland Kitchen. aspirin EC 81 MG tablet Take 81 mg by mouth daily.     Marland Kitchen. docusate sodium (COLACE) 100 MG capsule Take 100 mg by mouth 2 (two) times daily.    . furosemide (LASIX) 20 MG tablet Take 20 mg by mouth daily.    Marland Kitchen. glipiZIDE (GLUCOTROL) 10 MG tablet Take 5 mg by mouth. Patient takes 2 po in the morning , and 2 po at night.    . olmesartan-hydrochlorothiazide (BENICAR HCT) 40-25 MG per tablet Take by mouth daily. Taking 1/2 pill daily.    Marland Kitchen. omega-3 acid ethyl esters (LOVAZA) 1 G capsule Take 2 g by mouth 2 (two) times daily. Patient states that he takes in the morning.    . ranitidine (ZANTAC) 300 MG capsule Take 300 mg by mouth daily.     . rosuvastatin (CRESTOR) 20 MG tablet Take 20 mg by mouth daily.     No current facility-administered medications on file prior to visit.        Objective:   Physical Exam Blood pressure 122/78, pulse 66, resp. rate 18, height 6\' 3"  (1.905 m), weight (!) 357 lb (161.9 kg). Alert and oriented. Skin warm and dry. Oral mucosa is moist.   . Sclera anicteric, conjunctivae is pink. Thyroid not enlarged. No cervical lymphadenopathy. Lungs clear. Heart regular rate and rhythm.  Abdomen is soft. Bowel sounds are positive. No hepatomegaly. No abdominal masses felt.  No tenderness.  No edema to lower extremities.  Stool brown and guaiac negative.      Assessment & Plan:  Positive stool card. Colonic neoplasm, ulcer, AVM needs to be ruled out. Colonoscopy. The risks and benefits such as perforation, bleeding, and infection were reviewed with the patient and is agreeable.

## 2016-10-24 NOTE — Patient Instructions (Signed)
Colonoscopy.  The risks and benefits such as perforation, bleeding, and infection were reviewed with the patient and is agreeable. 

## 2016-10-31 ENCOUNTER — Ambulatory Visit (HOSPITAL_COMMUNITY)
Admission: RE | Admit: 2016-10-31 | Discharge: 2016-10-31 | Disposition: A | Discharge status: Home / Self Care | Payer: 59 | Source: Ambulatory Visit | Attending: Family Medicine | Admitting: Family Medicine

## 2016-10-31 ENCOUNTER — Other Ambulatory Visit (HOSPITAL_COMMUNITY): Payer: Self-pay | Admitting: Family Medicine

## 2016-10-31 DIAGNOSIS — R059 Cough, unspecified: Secondary | ICD-10-CM

## 2016-10-31 DIAGNOSIS — R05 Cough: Secondary | ICD-10-CM

## 2016-11-01 ENCOUNTER — Encounter (HOSPITAL_COMMUNITY): Admission: RE | Payer: Self-pay | Source: Ambulatory Visit

## 2016-11-01 ENCOUNTER — Ambulatory Visit (HOSPITAL_COMMUNITY): Admission: RE | Admit: 2016-11-01 | Payer: 59 | Source: Ambulatory Visit | Admitting: Internal Medicine

## 2016-11-01 SURGERY — COLONOSCOPY
Anesthesia: Moderate Sedation

## 2016-11-21 ENCOUNTER — Other Ambulatory Visit (HOSPITAL_COMMUNITY): Payer: Self-pay | Admitting: Family Medicine

## 2016-11-21 ENCOUNTER — Ambulatory Visit (HOSPITAL_COMMUNITY)
Admission: RE | Admit: 2016-11-21 | Discharge: 2016-11-21 | Disposition: A | Discharge status: Home / Self Care | Payer: 59 | Source: Ambulatory Visit | Attending: Family Medicine | Admitting: Family Medicine

## 2016-11-21 DIAGNOSIS — M7551 Bursitis of right shoulder: Secondary | ICD-10-CM | POA: Diagnosis not present

## 2016-12-10 ENCOUNTER — Encounter (INDEPENDENT_AMBULATORY_CARE_PROVIDER_SITE_OTHER): Payer: Self-pay

## 2017-02-19 ENCOUNTER — Encounter: Payer: Self-pay | Admitting: Physical Therapy

## 2017-02-19 ENCOUNTER — Ambulatory Visit: Payer: Medicare Other | Attending: Orthopedic Surgery | Admitting: Physical Therapy

## 2017-02-19 DIAGNOSIS — M25611 Stiffness of right shoulder, not elsewhere classified: Secondary | ICD-10-CM

## 2017-02-19 DIAGNOSIS — M25511 Pain in right shoulder: Secondary | ICD-10-CM | POA: Diagnosis present

## 2017-02-19 DIAGNOSIS — R29898 Other symptoms and signs involving the musculoskeletal system: Secondary | ICD-10-CM | POA: Diagnosis present

## 2017-02-19 DIAGNOSIS — R600 Localized edema: Secondary | ICD-10-CM | POA: Insufficient documentation

## 2017-02-19 NOTE — Patient Instructions (Signed)
  Shoulder Flexion:  Raise cane above your head until stretch is felt, hold 2 seconds repeat 15 times 2 times a day     Shoulder External Rotation:  Prop Right elbow on a pillow, place cane in right palm and push arm gently outward Repeat 15 times, hold each one 3-5 seconds Twice a day   Pendulums:  Let your arm hang, use your body weight to rotate arm in a circle Perform 30 times clockwise Perform 30 times counter clockwise 2 times a day    Allow arm to swing forward and back 30 times 2 times a day     Swing arm side to side 30 times 2 times a day

## 2017-02-19 NOTE — Therapy (Signed)
[facility] [address] [place], [place], [address] Phone: [phone]   Fax:  [phone]  Physical Therapy Evaluation  Patient Details  Name: [name] MRN: [id number] Date of Birth: 16-May-1961 Referring Provider: Dr. Viviann Spare Case  Encounter Date: 02/19/2017      PT End of Session - 02/19/17 0957    Visit Number 1   Number of Visits 16   Date for PT Re-Evaluation 04/21/17   Authorization Type UHC/Medicare   PT Start Time 0915   PT Stop Time 1003   PT Time Calculation (min) 48 min   Activity Tolerance Patient tolerated treatment well   Behavior During Therapy [facility] for tasks assessed/performed      Past Medical History:  Diagnosis Date  . Anginal pain (HCC) 12/29/2012  . Chronic low back pain   . CKD (chronic kidney disease) stage 3, GFR 30-59 ml/min   . Diabetes mellitus type 2, noninsulin dependent (HCC)   . Exertional shortness of breath   . GERD (gastroesophageal reflux disease)   . H/O hiatal hernia   . Headache(784.0)    "not daily, not weekly, but very often" (12/30/2012)  . HTN (hypertension)   . Hyperlipidemia   . Morbid obesity (HCC)   . Osteoarthritis    "hands" (12/30/2012)    Past Surgical History:  Procedure Laterality Date  . ELBOW ARTHROPLASTY Left 1990's   "3 spurs; ?14 staples" (12/30/2012)  . LEFT HEART CATHETERIZATION WITH CORONARY ANGIOGRAM N/A 12/31/2012   Procedure: LEFT HEART CATHETERIZATION WITH CORONARY ANGIOGRAM;  Surgeon: Tonny Bollman, MD;  Location: [facility] CATH LAB;  Service: Cardiovascular;  Laterality: N/A;  . LUMBAR DISC SURGERY  2010; 2011   "2 ruptured discs; 2 pinched nerves"    There were no vitals filed for this visit.       Subjective Assessment - 02/19/17 0913    Subjective Patient arriving to therapy today after arthroscopic right shoulder surgery on 02/07/17. Pt arriving in sling and was instructed by Dr. Case to wear the sling for 2 more weeks for comfort (03/05/17).    How long can you sit comfortably? unlimied   How long can you stand comfortably? unlimited   How long can you walk comfortably? unlimited   Currently in Pain? No/denies  pt reports waking up at night with pain            [facility] PT Assessment - 02/19/17 0001      Assessment   Medical Diagnosis Right Shoulder Arthroscopy 02/07/17   Referring Provider Dr. Viviann Spare Case   Onset Date/Surgical Date 02/07/17   Hand Dominance Right   Next MD Visit 03/19/17   Prior Therapy yes, for back      Precautions   Precautions Shoulder   Type of Shoulder Precautions No lifting greater than 5# over next two weeks   Required Braces or Orthoses Sling  03/05/17     Balance Screen   Has the patient fallen in the past 6 months No   Is the patient reluctant to leave their home because of a fear of falling?  No     Home Environment   Living Environment Private residence   Living Arrangements Alone   Available Help at Discharge Family   Type of Home Apartment   Home Access Level entry  one step to enter   Home Layout One level   Home Equipment None     Prior Function   Level of Independence Independent   Vocation On disability  Leisure puzzels     Cognition   Overall Cognitive Status Within Functional Limits for tasks assessed     Observation/Other Assessments   Focus on Therapeutic Outcomes (FOTO)  52% limitation     Coordination   Fine Motor Movements are Fluid and Coordinated --  finger opposition intact R UE     Posture/Postural Control   Posture/Postural Control Postural limitations     ROM / Strength   AROM / PROM / Strength AROM;Strength     AROM   Overall AROM  Deficits   AROM Assessment Site Shoulder   Right/Left Shoulder Right   Right Shoulder Flexion 82 Degrees  limited by pain   Right Shoulder ABduction 68 Degrees   Right Shoulder Internal Rotation --  WFL, to stomach   Right Shoulder External Rotation 60 Degrees     Strength   Overall Strength Comments L shoulder  strength grossly 5/5    Strength Assessment Site Shoulder   Right/Left Shoulder Right   Right Shoulder Flexion 2+/5   Right Shoulder Extension 2+/5   Right Shoulder ABduction 2+/5   Right Shoulder Internal Rotation 2+/5   Right Shoulder External Rotation 2+/5     Palpation   Palpation comment Pt with tenderness around surgical insertion sites     Ambulation/Gait   Gait Comments Pt amb with right arm in sling                    [facility] Adult PT Treatment/Exercise - 02/19/17 0001      Exercises   Exercises Shoulder     Shoulder Exercises: Supine   External Rotation AAROM;10 reps;Limitations;Other (comment)   External Rotation Limitations with cane   Flexion AAROM;10 reps;Other (comment)   Flexion Limitations with cane     Shoulder Exercises: ROM/Strengthening   Pendulum Clockwise, Counter clockwise, forward/back and side to side     Modalities   Modalities Vasopneumatic     Vasopneumatic   Number Minutes Vasopneumatic  15 minutes   Vasopnuematic Location  Shoulder   Vasopneumatic Pressure Low   Vasopneumatic Temperature  48                PT Education - 02/19/17 0917    Education provided Yes   Education Details HEP issued   Person(s) Educated Patient   Methods Explanation;Demonstration   Comprehension Verbalized understanding;Returned demonstration          PT Short Term Goals - 02/19/17 1010      PT SHORT TERM GOAL #1   Title Pt will be independent in his HEP.   Time 3   Period Weeks   Status New           PT Long Term Goals - 02/19/17 1010      PT LONG TERM GOAL #1   Title Patient will be independent in his HEP and progression.    Time 8   Period Weeks   Status New     PT LONG TERM GOAL #2   Title Patient will improve his FOTO score from 52% limiitation to >/= 34% limitation   Time 8   Period Weeks   Status New     PT LONG TERM GOAL #3   Title Patient will be able to perform Right shoulder flexion to >/= 140 degrees  with pain less than 3/10.    Time 8   Period Weeks   Status New     PT LONG TERM GOAL #4   Title Patient  will be able to perform ADL's with no pain.   Time 8   Period Weeks   Status New     PT LONG TERM GOAL #5   Title Patient will increase his R shoulder flexion and abduction strength to >/= 4/5 in order to improve functional mobility.   Time 8   Period Weeks   Status New               Plan - 02/19/17 1006    Clinical Impression Statement Patient arriving to therapy as a low complexity evaluaiton following a right shoulder arthroscopy on 02/07/17. Pt reporting no pain at rest, but reporting pain at night and with movements. Pt was issued a HEP today and his ROM was limited by pain. Pt would benefit from skilled PT to address his decreased strength, ROM, and pain.    Rehab Potential Excellent   PT Frequency 2x / week   PT Duration 8 weeks   PT Treatment/Interventions ADLs/Self Care Home Management;Cryotherapy;Electrical Stimulation;Ultrasound;Neuromuscular re-education;Therapeutic exercise;Therapeutic activities;Functional mobility training;Patient/family education;Manual techniques;Passive range of motion;Dry needling;Taping;Vasopneumatic Device   PT Next Visit Plan ROM, gentle strengthening exercises right shoulder, modalities as pt tolerates, vasopneumatic   PT Home Exercise Plan Pendulums, cane flexion and ER   Consulted and Agree with Plan of Care Patient      Patient will benefit from skilled therapeutic intervention in order to improve the following deficits and impairments:  Postural dysfunction, Decreased range of motion, Impaired UE functional use, Pain, Decreased strength, Decreased activity tolerance  Visit Diagnosis: Acute pain of right shoulder  Stiffness of right shoulder, not elsewhere classified  Localized edema  Shoulder weakness      G-Codes - 02/19/17 1014    Functional Assessment Tool Used (Outpatient Only) FOTO, clinical assessment    Functional Limitation Carrying, moving and handling objects   Carrying, Moving and Handling Objects Current Status (Z6109(G8984) At least 40 percent but less than 60 percent impaired, limited or restricted   Carrying, Moving and Handling Objects Goal Status (U0454(G8985) At least 20 percent but less than 40 percent impaired, limited or restricted       Problem List Patient Active Problem List   Diagnosis Date Noted  . Guaiac positive stools 10/24/2016  . Precordial pain 12/30/2012  . HTN (hypertension)   . Diabetes mellitus type 2, noninsulin dependent (HCC)   . Hyperlipidemia   . Morbid obesity (HCC)   . CKD (chronic kidney disease) stage 3, GFR 30-59 ml/min   . Anginal pain (HCC) 12/29/2012    Sharmon LeydenJennifer R Martin, MPT 02/19/2017, 10:20 AM  [facility] [address] [place], [place], [address] Phone: [phone]   Fax:  [phone]  Name: Ezzie Duralhomas W Silveira MRN: [id number] Date of Birth: 02-15-1961

## 2017-02-20 ENCOUNTER — Ambulatory Visit: Payer: Medicare Other | Admitting: *Deleted

## 2017-02-20 DIAGNOSIS — R600 Localized edema: Secondary | ICD-10-CM

## 2017-02-20 DIAGNOSIS — M25511 Pain in right shoulder: Secondary | ICD-10-CM

## 2017-02-20 DIAGNOSIS — R29898 Other symptoms and signs involving the musculoskeletal system: Secondary | ICD-10-CM

## 2017-02-20 DIAGNOSIS — M25611 Stiffness of right shoulder, not elsewhere classified: Secondary | ICD-10-CM

## 2017-02-20 NOTE — Therapy (Signed)
[facility] [address] [place], [place], [address] Phone: [phone]   Fax:  [phone]  Physical Therapy Treatment  Patient Details  Name: [name] MRN: [id number] Date of Birth: 1960-10-30 Referring Provider: Dr. Viviann Spare Case  Encounter Date: 02/20/2017      PT End of Session - 02/20/17 1442    Visit Number 2   Number of Visits 16   Date for PT Re-Evaluation 04/21/17   Authorization Type UHC/Medicare   PT Start Time 1430   PT Stop Time 1522   PT Time Calculation (min) 52 min      Past Medical History:  Diagnosis Date  . Anginal pain (HCC) 12/29/2012  . Chronic low back pain   . CKD (chronic kidney disease) stage 3, GFR 30-59 ml/min   . Diabetes mellitus type 2, noninsulin dependent (HCC)   . Exertional shortness of breath   . GERD (gastroesophageal reflux disease)   . H/O hiatal hernia   . Headache(784.0)    "not daily, not weekly, but very often" (12/30/2012)  . HTN (hypertension)   . Hyperlipidemia   . Morbid obesity (HCC)   . Osteoarthritis    "hands" (12/30/2012)    Past Surgical History:  Procedure Laterality Date  . ELBOW ARTHROPLASTY Left 1990's   "3 spurs; ?14 staples" (12/30/2012)  . LEFT HEART CATHETERIZATION WITH CORONARY ANGIOGRAM N/A 12/31/2012   Procedure: LEFT HEART CATHETERIZATION WITH CORONARY ANGIOGRAM;  Surgeon: Tonny Bollman, MD;  Location: [facility] CATH LAB;  Service: Cardiovascular;  Laterality: N/A;  . LUMBAR DISC SURGERY  2010; 2011   "2 ruptured discs; 2 pinched nerves"    There were no vitals filed for this visit.      Subjective Assessment - 02/20/17 1434    Subjective Patient arriving to therapy today after arthroscopic right shoulder surgery on 02/07/17, subacrominal decompression, SLAP, and debridment.  Pt arriving in sling and was instructed by Dr. Case to wear the sling for 2 more weeks for comfort (03/05/17).    Pertinent History Sling until 03/05/17 per pt report from Dr. Case   Currently in Pain? Yes   Pain Score 2    Pain Orientation Right   Pain Descriptors / Indicators Sore   Pain Type Surgical pain                         [facility] Adult PT Treatment/Exercise - 02/20/17 0001      Exercises   Exercises Shoulder     Modalities   Modalities Vasopneumatic     Vasopneumatic   Number Minutes Vasopneumatic  15 minutes   Vasopnuematic Location  Shoulder   Vasopneumatic Pressure Low   Vasopneumatic Temperature  36     Manual Therapy   Manual Therapy Passive ROM   Passive ROM PROM to RT shldr for flexion and ER, Rhythmic stab for IR/ER in neutral                  PT Short Term Goals - 02/19/17 1010      PT SHORT TERM GOAL #1   Title Pt will be independent in his HEP.   Time 3   Period Weeks   Status New           PT Long Term Goals - 02/19/17 1010      PT LONG TERM GOAL #1   Title Patient will be independent in his HEP and progression.    Time 8   Period  Weeks   Status New     PT LONG TERM GOAL #2   Title Patient will improve his FOTO score from 52% limiitation to >/= 34% limitation   Time 8   Period Weeks   Status New     PT LONG TERM GOAL #3   Title Patient will be able to perform Right shoulder flexion to >/= 140 degrees with pain less than 3/10.    Time 8   Period Weeks   Status New     PT LONG TERM GOAL #4   Title Patient will be able to perform ADL's with no pain.   Time 8   Period Weeks   Status New     PT LONG TERM GOAL #5   Title Patient will increase his R shoulder flexion and abduction strength to >/= 4/5 in order to improve functional mobility.   Time 8   Period Weeks   Status New               Plan - 02/20/17 1442    Clinical Impression Statement Pt arrived to clinic in sling and mainly has soreness in RT shldr 2-3/10. Pt did well with PROM and rhythmic stabilization for ER and IR. ROM for flexion to 95 degrees, Er to 30 degrees. He did well with Vaso   Rehab Potential Excellent    PT Frequency 2x / week   PT Duration 8 weeks   PT Treatment/Interventions ADLs/Self Care Home Management;Cryotherapy;Electrical Stimulation;Ultrasound;Neuromuscular re-education;Therapeutic exercise;Therapeutic activities;Functional mobility training;Patient/family education;Manual techniques;Passive range of motion;Dry needling;Taping;Vasopneumatic Device   PT Next Visit Plan ROM, gentle strengthening exercises right shoulder, modalities as pt tolerates, vasopneumatic      Patient will benefit from skilled therapeutic intervention in order to improve the following deficits and impairments:  Postural dysfunction, Decreased range of motion, Impaired UE functional use, Pain, Decreased strength, Decreased activity tolerance  Visit Diagnosis: Acute pain of right shoulder  Stiffness of right shoulder, not elsewhere classified  Localized edema  Shoulder weakness       G-Codes - 02/19/17 1014    Functional Assessment Tool Used (Outpatient Only) FOTO, clinical assessment   Functional Limitation Carrying, moving and handling objects   Carrying, Moving and Handling Objects Current Status (R6045(G8984) At least 40 percent but less than 60 percent impaired, limited or restricted   Carrying, Moving and Handling Objects Goal Status (W0981(G8985) At least 20 percent but less than 40 percent impaired, limited or restricted      Problem List Patient Active Problem List   Diagnosis Date Noted  . Guaiac positive stools 10/24/2016  . Precordial pain 12/30/2012  . HTN (hypertension)   . Diabetes mellitus type 2, noninsulin dependent (HCC)   . Hyperlipidemia   . Morbid obesity (HCC)   . CKD (chronic kidney disease) stage 3, GFR 30-59 ml/min   . Anginal pain (HCC) 12/29/2012    RAMSEUR,[name], PTA 02/20/2017, 5:52 PM  [facility] [address] [place], [place], [address] Phone: [phone]   Fax:  [phone]  Name: Ezzie Duralhomas W Dadamo MRN: [id number] Date  of Birth: [birth year]

## 2017-02-24 ENCOUNTER — Ambulatory Visit: Payer: Medicare Other | Admitting: Physical Therapy

## 2017-02-24 ENCOUNTER — Encounter: Payer: Self-pay | Admitting: Physical Therapy

## 2017-02-24 DIAGNOSIS — M25511 Pain in right shoulder: Secondary | ICD-10-CM

## 2017-02-24 DIAGNOSIS — R29898 Other symptoms and signs involving the musculoskeletal system: Secondary | ICD-10-CM

## 2017-02-24 DIAGNOSIS — R600 Localized edema: Secondary | ICD-10-CM

## 2017-02-24 DIAGNOSIS — M25611 Stiffness of right shoulder, not elsewhere classified: Secondary | ICD-10-CM

## 2017-02-24 NOTE — Therapy (Signed)
[facility] [address] [place], [place], [address] Phone: [phone]   Fax:  [phone]  Physical Therapy Treatment  Patient Details  Name: RONNEL ZUERCHER MRN: [id number] Date of Birth: Aug 28, 1961 Referring Provider: Dr. Viviann Spare Case  Encounter Date: 02/24/2017      PT End of Session - 02/24/17 0731    Visit Number 3   Number of Visits 16   Date for PT Re-Evaluation 04/21/17   Authorization Type UHC/Medicare   PT Start Time 0735   PT Stop Time 0817   PT Time Calculation (min) 42 min   Activity Tolerance Patient tolerated treatment well   Behavior During Therapy [facility] for tasks assessed/performed      Past Medical History:  Diagnosis Date  . Anginal pain (HCC) 12/29/2012  . Chronic low back pain   . CKD (chronic kidney disease) stage 3, GFR 30-59 ml/min   . Diabetes mellitus type 2, noninsulin dependent (HCC)   . Exertional shortness of breath   . GERD (gastroesophageal reflux disease)   . H/O hiatal hernia   . Headache(784.0)    "not daily, not weekly, but very often" (12/30/2012)  . HTN (hypertension)   . Hyperlipidemia   . Morbid obesity (HCC)   . Osteoarthritis    "hands" (12/30/2012)    Past Surgical History:  Procedure Laterality Date  . ELBOW ARTHROPLASTY Left 1990's   "3 spurs; ?14 staples" (12/30/2012)  . LEFT HEART CATHETERIZATION WITH CORONARY ANGIOGRAM N/A 12/31/2012   Procedure: LEFT HEART CATHETERIZATION WITH CORONARY ANGIOGRAM;  Surgeon: Tonny Bollman, MD;  Location: [facility] CATH LAB;  Service: Cardiovascular;  Laterality: N/A;  . LUMBAR DISC SURGERY  2010; 2011   "2 ruptured discs; 2 pinched nerves"    There were no vitals filed for this visit.      Subjective Assessment - 02/24/17 0730    Subjective Reports that his shoulder is stiff this morning. Reports he does have exercises for home and that the post surgical taping is still present.   Pertinent History Sling until 03/05/17 per pt report from Dr.  Case   How long can you sit comfortably? unlimied   How long can you stand comfortably? unlimited   How long can you walk comfortably? unlimited   Currently in Pain? Yes   Pain Score 5    Pain Location Shoulder   Pain Orientation Right   Pain Descriptors / Indicators Discomfort;Other (Comment)  Stiffness   Pain Type Surgical pain            [facility] PT Assessment - 02/24/17 0001      Assessment   Medical Diagnosis Right Shoulder Arthroscopy 02/07/17   Onset Date/Surgical Date 02/07/17   Hand Dominance Right   Next MD Visit 03/19/17   Prior Therapy yes, for back      Precautions   Precautions Shoulder   Type of Shoulder Precautions No lifting greater than 5# over next two weeks   Required Braces or Orthoses Sling                     [facility] Adult PT Treatment/Exercise - 02/24/17 0001      Modalities   Modalities Vasopneumatic     Vasopneumatic   Number Minutes Vasopneumatic  15 minutes   Vasopnuematic Location  Shoulder   Vasopneumatic Pressure Low   Vasopneumatic Temperature  34     Manual Therapy   Manual Therapy Passive ROM   Passive ROM PROM of R shoulder into flex/IR/  ER within protocol limitations with gentle holds at end range                  PT Short Term Goals - 02/19/17 1010      PT SHORT TERM GOAL #1   Title Pt will be independent in his HEP.   Time 3   Period Weeks   Status New           PT Long Term Goals - 02/19/17 1010      PT LONG TERM GOAL #1   Title Patient will be independent in his HEP and progression.    Time 8   Period Weeks   Status New     PT LONG TERM GOAL #2   Title Patient will improve his FOTO score from 52% limiitation to >/= 34% limitation   Time 8   Period Weeks   Status New     PT LONG TERM GOAL #3   Title Patient will be able to perform Right shoulder flexion to >/= 140 degrees with pain less than 3/10.    Time 8   Period Weeks   Status New     PT LONG TERM GOAL #4   Title Patient will  be able to perform ADL's with no pain.   Time 8   Period Weeks   Status New     PT LONG TERM GOAL #5   Title Patient will increase his R shoulder flexion and abduction strength to >/= 4/5 in order to improve functional mobility.   Time 8   Period Weeks   Status New               Plan - 02/24/17 0804    Clinical Impression Statement Patient arrived to clinic with increased stiffness and discomfort. Gentle PROM of R shoulder into flexion/ER/IR tolerated well by patient although reported continued R shoulder discomfort and soreness with PROM. Patient compliant with sling use as it was donned upon arrival in therapy clinic. Normal modality response noted following removal of the modality following treatment. Goals remain on-going secondry to protocol limitations.   Rehab Potential Excellent   PT Frequency 2x / week   PT Duration 8 weeks   PT Treatment/Interventions ADLs/Self Care Home Management;Cryotherapy;Electrical Stimulation;Ultrasound;Neuromuscular re-education;Therapeutic exercise;Therapeutic activities;Functional mobility training;Patient/family education;Manual techniques;Passive range of motion;Dry needling;Taping;Vasopneumatic Device   PT Next Visit Plan ROM, gentle strengthening exercises right shoulder, modalities as pt tolerates, vasopneumatic   PT Home Exercise Plan Pendulums, cane flexion and ER   Consulted and Agree with Plan of Care Patient      Patient will benefit from skilled therapeutic intervention in order to improve the following deficits and impairments:  Postural dysfunction, Decreased range of motion, Impaired UE functional use, Pain, Decreased strength, Decreased activity tolerance  Visit Diagnosis: Acute pain of right shoulder  Stiffness of right shoulder, not elsewhere classified  Localized edema  Shoulder weakness     Problem List Patient Active Problem List   Diagnosis Date Noted  . Guaiac positive stools 10/24/2016  . Precordial pain  12/30/2012  . HTN (hypertension)   . Diabetes mellitus type 2, noninsulin dependent (HCC)   . Hyperlipidemia   . Morbid obesity (HCC)   . CKD (chronic kidney disease) stage 3, GFR 30-59 ml/min   . Anginal pain (HCC) 12/29/2012    Evelene Croon, PTA 02/24/2017, 8:23 AM  [facility] [address] [place], [place], [address] Phone: [phone]   Fax:  [phone]  Name: Ezzie Duralhomas W Cabeza MRN: [id number] Date of Birth: [birth year]

## 2017-02-27 ENCOUNTER — Ambulatory Visit: Payer: Medicare Other | Admitting: Physical Therapy

## 2017-02-27 ENCOUNTER — Encounter: Payer: Self-pay | Admitting: Physical Therapy

## 2017-02-27 DIAGNOSIS — M25511 Pain in right shoulder: Secondary | ICD-10-CM | POA: Diagnosis not present

## 2017-02-27 DIAGNOSIS — M25611 Stiffness of right shoulder, not elsewhere classified: Secondary | ICD-10-CM

## 2017-02-27 DIAGNOSIS — R600 Localized edema: Secondary | ICD-10-CM

## 2017-02-27 DIAGNOSIS — R29898 Other symptoms and signs involving the musculoskeletal system: Secondary | ICD-10-CM

## 2017-02-27 NOTE — Therapy (Signed)
[facility] [address] [place], [place], [address] Phone: [phone]   Fax:  [phone]  Physical Therapy Treatment  Patient Details  Name: [name] MRN: [id number] Date of Birth: [birth year] Referring Provider: Dr. Viviann Spare Case  Encounter Date: 02/27/2017      PT End of Session - 02/27/17 0732    Visit Number 4   Number of Visits 16   Date for PT Re-Evaluation 04/21/17   Authorization Type UHC/Medicare   PT Start Time 0732   PT Stop Time 0816   PT Time Calculation (min) 44 min   Activity Tolerance Patient tolerated treatment well   Behavior During Therapy [facility] for tasks assessed/performed      Past Medical History:  Diagnosis Date  . Anginal pain (HCC) 12/29/2012  . Chronic low back pain   . CKD (chronic kidney disease) stage 3, GFR 30-59 ml/min   . Diabetes mellitus type 2, noninsulin dependent (HCC)   . Exertional shortness of breath   . GERD (gastroesophageal reflux disease)   . H/O hiatal hernia   . Headache(784.0)    "not daily, not weekly, but very often" (12/30/2012)  . HTN (hypertension)   . Hyperlipidemia   . Morbid obesity (HCC)   . Osteoarthritis    "hands" (12/30/2012)    Past Surgical History:  Procedure Laterality Date  . ELBOW ARTHROPLASTY Left 1990's   "3 spurs; ?14 staples" (12/30/2012)  . LEFT HEART CATHETERIZATION WITH CORONARY ANGIOGRAM N/A 12/31/2012   Procedure: LEFT HEART CATHETERIZATION WITH CORONARY ANGIOGRAM;  Surgeon: Tonny Bollman, MD;  Location: [facility] CATH LAB;  Service: Cardiovascular;  Laterality: N/A;  . LUMBAR DISC SURGERY  2010; 2011   "2 ruptured discs; 2 pinched nerves"    There were no vitals filed for this visit.      Subjective Assessment - 02/27/17 0731    Subjective Reports 3-4/10 pain and reports that post surgical bandages have fell off. Reports no pain with washing dishes last night.   Pertinent History Sling until 03/05/17 per pt report from Dr. Case   How long  can you sit comfortably? unlimied   How long can you stand comfortably? unlimited   How long can you walk comfortably? unlimited   Currently in Pain? Yes   Pain Score 4    Pain Location Shoulder   Pain Orientation Right   Pain Descriptors / Indicators Sore;Discomfort   Pain Type Surgical pain            [facility] PT Assessment - 02/27/17 0001      Assessment   Medical Diagnosis Right Shoulder Arthroscopy 02/07/17   Onset Date/Surgical Date 02/07/17   Hand Dominance Right   Next MD Visit 03/19/17   Prior Therapy yes, for back      Precautions   Precautions Shoulder   Type of Shoulder Precautions No lifting greater than 5# over next two weeks   Required Braces or Orthoses Sling                     [facility] Adult PT Treatment/Exercise - 02/27/17 0001      Modalities   Modalities Electrical Stimulation;Vasopneumatic     Electrical Stimulation   Electrical Stimulation Location R shoulder    Electrical Stimulation Action IFC   Electrical Stimulation Parameters 1-10 hz x15 min   Electrical Stimulation Goals Pain;Edema     Vasopneumatic   Number Minutes Vasopneumatic  15 minutes   Vasopnuematic Location  Shoulder   Vasopneumatic Pressure  Medium   Vasopneumatic Temperature  34     Manual Therapy   Manual Therapy Passive ROM   Passive ROM PROM of R shoulder into flex/ER/IR per protocol limitations and R elbow into flexion and extension per protocol limitations                  PT Short Term Goals - 02/19/17 1010      PT SHORT TERM GOAL #1   Title Pt will be independent in his HEP.   Time 3   Period Weeks   Status New           PT Long Term Goals - 02/19/17 1010      PT LONG TERM GOAL #1   Title Patient will be independent in his HEP and progression.    Time 8   Period Weeks   Status New     PT LONG TERM GOAL #2   Title Patient will improve his FOTO score from 52% limiitation to >/= 34% limitation   Time 8   Period Weeks   Status New      PT LONG TERM GOAL #3   Title Patient will be able to perform Right shoulder flexion to >/= 140 degrees with pain less than 3/10.    Time 8   Period Weeks   Status New     PT LONG TERM GOAL #4   Title Patient will be able to perform ADL's with no pain.   Time 8   Period Weeks   Status New     PT LONG TERM GOAL #5   Title Patient will increase his R shoulder flexion and abduction strength to >/= 4/5 in order to improve functional mobility.   Time 8   Period Weeks   Status New               Plan - 02/27/17 0804    Clinical Impression Statement Patient continues to present with R shoulder soreness and discomfort but is compliant with sling use as it was donned upon arrival into gym. PROM of R shoulder completed into flex/ER/IR per protocol limitations and R elbow into flex/ext per protocol limitations. Per patient report the post surgical dressings have been removed and electrical stimulation was initiated. Normal modalities response noted following removal of the modalities. Goals remain on-going secondary to protcol limitations.   Rehab Potential Excellent   PT Frequency 2x / week   PT Duration 8 weeks   PT Treatment/Interventions ADLs/Self Care Home Management;Cryotherapy;Electrical Stimulation;Ultrasound;Neuromuscular re-education;Therapeutic exercise;Therapeutic activities;Functional mobility training;Patient/family education;Manual techniques;Passive range of motion;Dry needling;Taping;Vasopneumatic Device   PT Next Visit Plan Continue per SLAP protocol with modalities per MPT POC.   PT Home Exercise Plan Pendulums, cane flexion and ER   Consulted and Agree with Plan of Care Patient      Patient will benefit from skilled therapeutic intervention in order to improve the following deficits and impairments:  Postural dysfunction, Decreased range of motion, Impaired UE functional use, Pain, Decreased strength, Decreased activity tolerance  Visit Diagnosis: Acute pain of  right shoulder  Stiffness of right shoulder, not elsewhere classified  Localized edema  Shoulder weakness     Problem List Patient Active Problem List   Diagnosis Date Noted  . Guaiac positive stools 10/24/2016  . Precordial pain 12/30/2012  . HTN (hypertension)   . Diabetes mellitus type 2, noninsulin dependent (HCC)   . Hyperlipidemia   . Morbid obesity (HCC)   . CKD (chronic kidney disease) stage 3,  GFR 30-59 ml/min   . Anginal pain (HCC) 12/29/2012    Evelene Croon, PTA 02/27/2017, 8:22 AM  [facility] [address] [place], [place], [address] Phone: [phone]   Fax:  [phone]  Name: [name] MRN: [id number] Date of Birth: September 21, 1961

## 2017-03-03 ENCOUNTER — Encounter: Payer: Self-pay | Admitting: Physical Therapy

## 2017-03-03 ENCOUNTER — Ambulatory Visit: Payer: Medicare Other | Admitting: Physical Therapy

## 2017-03-03 DIAGNOSIS — R600 Localized edema: Secondary | ICD-10-CM

## 2017-03-03 DIAGNOSIS — M25511 Pain in right shoulder: Secondary | ICD-10-CM | POA: Diagnosis not present

## 2017-03-03 DIAGNOSIS — R29898 Other symptoms and signs involving the musculoskeletal system: Secondary | ICD-10-CM

## 2017-03-03 DIAGNOSIS — M25611 Stiffness of right shoulder, not elsewhere classified: Secondary | ICD-10-CM

## 2017-03-03 NOTE — Therapy (Signed)
[facility] [address] [place], [place], [address] Phone: [phone]   Fax:  [phone]  Physical Therapy Treatment  Patient Details  Name: [name] MRN: [id number] Date of Birth: [birth year] Referring Provider: Dr. Viviann Spare Case  Encounter Date: 03/03/2017      PT End of Session - 03/03/17 0732    Visit Number 5   Number of Visits 16   Date for PT Re-Evaluation 04/21/17   Authorization Type UHC/Medicare   PT Start Time 0732   PT Stop Time 0815   PT Time Calculation (min) 43 min   Activity Tolerance Patient tolerated treatment well   Behavior During Therapy [facility] for tasks assessed/performed      Past Medical History:  Diagnosis Date  . Anginal pain (HCC) 12/29/2012  . Chronic low back pain   . CKD (chronic kidney disease) stage 3, GFR 30-59 ml/min   . Diabetes mellitus type 2, noninsulin dependent (HCC)   . Exertional shortness of breath   . GERD (gastroesophageal reflux disease)   . H/O hiatal hernia   . Headache(784.0)    "not daily, not weekly, but very often" (12/30/2012)  . HTN (hypertension)   . Hyperlipidemia   . Morbid obesity (HCC)   . Osteoarthritis    "hands" (12/30/2012)    Past Surgical History:  Procedure Laterality Date  . ELBOW ARTHROPLASTY Left 1990's   "3 spurs; ?14 staples" (12/30/2012)  . LEFT HEART CATHETERIZATION WITH CORONARY ANGIOGRAM N/A 12/31/2012   Procedure: LEFT HEART CATHETERIZATION WITH CORONARY ANGIOGRAM;  Surgeon: Tonny Bollman, MD;  Location: [facility] CATH LAB;  Service: Cardiovascular;  Laterality: N/A;  . LUMBAR DISC SURGERY  2010; 2011   "2 ruptured discs; 2 pinched nerves"    There were no vitals filed for this visit.      Subjective Assessment - 03/03/17 0732    Subjective Reports mostly sore in lateral R shoulder in deltoids.   Pertinent History Sling until 03/05/17 per pt report from Dr. Case   How long can you sit comfortably? unlimied   How long can you stand  comfortably? unlimited   How long can you walk comfortably? unlimited   Currently in Pain? Yes   Pain Score 4    Pain Location Shoulder   Pain Orientation Right   Pain Descriptors / Indicators Sore   Pain Type Surgical pain            [facility] PT Assessment - 03/03/17 0001      Assessment   Medical Diagnosis Right Shoulder Arthroscopy 02/07/17   Onset Date/Surgical Date 02/07/17   Hand Dominance Right   Next MD Visit 03/19/17   Prior Therapy yes, for back      Precautions   Precautions Shoulder   Type of Shoulder Precautions No lifting greater than 5# over next two weeks   Required Braces or Orthoses Sling                     [facility] Adult PT Treatment/Exercise - 03/03/17 0001      Modalities   Modalities Electrical Stimulation;Vasopneumatic     Electrical Stimulation   Electrical Stimulation Location R shoulder    Electrical Stimulation Action IFC   Electrical Stimulation Parameters 1-10 hz x15 min   Electrical Stimulation Goals Pain;Edema     Vasopneumatic   Number Minutes Vasopneumatic  15 minutes   Vasopnuematic Location  Shoulder   Vasopneumatic Pressure Medium   Vasopneumatic Temperature  60  Manual Therapy   Manual Therapy Passive ROM   Passive ROM PROM of R shoulder into flex/ER/IR per protocol limitations and R elbow into flexion and extension per protocol limitations; Sub max R shoulder isometrics in ER/IR x10 reps each with 5 sec hold                  PT Short Term Goals - 02/19/17 1010      PT SHORT TERM GOAL #1   Title Pt will be independent in his HEP.   Time 3   Period Weeks   Status New           PT Long Term Goals - 02/19/17 1010      PT LONG TERM GOAL #1   Title Patient will be independent in his HEP and progression.    Time 8   Period Weeks   Status New     PT LONG TERM GOAL #2   Title Patient will improve his FOTO score from 52% limiitation to >/= 34% limitation   Time 8   Period Weeks   Status New      PT LONG TERM GOAL #3   Title Patient will be able to perform Right shoulder flexion to >/= 140 degrees with pain less than 3/10.    Time 8   Period Weeks   Status New     PT LONG TERM GOAL #4   Title Patient will be able to perform ADL's with no pain.   Time 8   Period Weeks   Status New     PT LONG TERM GOAL #5   Title Patient will increase his R shoulder flexion and abduction strength to >/= 4/5 in order to improve functional mobility.   Time 8   Period Weeks   Status New               Plan - 03/03/17 0802    Clinical Impression Statement Patient presented in clinic again today with R shoulder sling donned and with reports of R shoulder soreness inferior to R acromian process region and into deltoids. Patient educated that soreness may last for several weeks post surgery and may have soreness with each progression of protocol. PROM of R shoulder into flexion completed with R elbow flexion to reduce tension on long head of the Bicep per protocol. Patient experienced more soreness with PROM of R shoulder into ER today. Sub maximal R shoulder isometrics completed into ER/IR today with no complaints from patient. Normal modalities response noted following removal of the modalities.   Rehab Potential Excellent   PT Frequency 2x / week   PT Duration 8 weeks   PT Treatment/Interventions ADLs/Self Care Home Management;Cryotherapy;Electrical Stimulation;Ultrasound;Neuromuscular re-education;Therapeutic exercise;Therapeutic activities;Functional mobility training;Patient/family education;Manual techniques;Passive range of motion;Dry needling;Taping;Vasopneumatic Device   PT Next Visit Plan Continue per SLAP protocol with modalities per MPT POC.   PT Home Exercise Plan Pendulums, cane flexion and ER   Consulted and Agree with Plan of Care Patient      Patient will benefit from skilled therapeutic intervention in order to improve the following deficits and impairments:  Postural  dysfunction, Decreased range of motion, Impaired UE functional use, Pain, Decreased strength, Decreased activity tolerance  Visit Diagnosis: Acute pain of right shoulder  Stiffness of right shoulder, not elsewhere classified  Localized edema  Shoulder weakness     Problem List Patient Active Problem List   Diagnosis Date Noted  . Guaiac positive stools 10/24/2016  . Precordial pain  12/30/2012  . HTN (hypertension)   . Diabetes mellitus type 2, noninsulin dependent (HCC)   . Hyperlipidemia   . Morbid obesity (HCC)   . CKD (chronic kidney disease) stage 3, GFR 30-59 ml/min   . Anginal pain (HCC) 12/29/2012    Evelene Croon, PTA 03/03/2017, 8:16 AM  [facility] [address] [place], [place], [address] Phone: [phone]   Fax:  [phone]  Name: [name] MRN: [id number] Date of Birth: 09/23/1961

## 2017-03-05 ENCOUNTER — Ambulatory Visit: Payer: Medicare Other | Admitting: Physical Therapy

## 2017-03-05 ENCOUNTER — Encounter: Payer: Self-pay | Admitting: Physical Therapy

## 2017-03-05 DIAGNOSIS — R29898 Other symptoms and signs involving the musculoskeletal system: Secondary | ICD-10-CM

## 2017-03-05 DIAGNOSIS — M25511 Pain in right shoulder: Secondary | ICD-10-CM | POA: Diagnosis not present

## 2017-03-05 DIAGNOSIS — R600 Localized edema: Secondary | ICD-10-CM

## 2017-03-05 DIAGNOSIS — M25611 Stiffness of right shoulder, not elsewhere classified: Secondary | ICD-10-CM

## 2017-03-05 NOTE — Therapy (Signed)
[facility] [address] [place], [place], [address] Phone: [phone]   Fax:  [phone]  Physical Therapy Treatment  Patient Details  Name: [name] MRN: [id number] Date of Birth: April 05, 1961 Referring Provider: Dr. Viviann Spare Case  Encounter Date: 03/05/2017      PT End of Session - 03/05/17 0731    Visit Number 6   Number of Visits 16   Date for PT Re-Evaluation 04/21/17   Authorization Type UHC/Medicare   PT Start Time 0731   PT Stop Time 0814   PT Time Calculation (min) 43 min   Activity Tolerance Patient tolerated treatment well   Behavior During Therapy [facility] for tasks assessed/performed      Past Medical History:  Diagnosis Date  . Anginal pain (HCC) 12/29/2012  . Chronic low back pain   . CKD (chronic kidney disease) stage 3, GFR 30-59 ml/min   . Diabetes mellitus type 2, noninsulin dependent (HCC)   . Exertional shortness of breath   . GERD (gastroesophageal reflux disease)   . H/O hiatal hernia   . Headache(784.0)    "not daily, not weekly, but very often" (12/30/2012)  . HTN (hypertension)   . Hyperlipidemia   . Morbid obesity (HCC)   . Osteoarthritis    "hands" (12/30/2012)    Past Surgical History:  Procedure Laterality Date  . ELBOW ARTHROPLASTY Left 1990's   "3 spurs; ?14 staples" (12/30/2012)  . LEFT HEART CATHETERIZATION WITH CORONARY ANGIOGRAM N/A 12/31/2012   Procedure: LEFT HEART CATHETERIZATION WITH CORONARY ANGIOGRAM;  Surgeon: Tonny Bollman, MD;  Location: [facility] CATH LAB;  Service: Cardiovascular;  Laterality: N/A;  . LUMBAR DISC SURGERY  2010; 2011   "2 ruptured discs; 2 pinched nerves"    There were no vitals filed for this visit.      Subjective Assessment - 03/05/17 0730    Subjective Reports that he has a little soreness in R shoulder but not as it has been. Reports that tomorrow he can get out of the sling per instructions from MD.   Pertinent History Sling until 03/05/17 per pt report  from Dr. Case   How long can you sit comfortably? unlimied   How long can you stand comfortably? unlimited   How long can you walk comfortably? unlimited   Currently in Pain? Yes   Pain Score 2    Pain Location Shoulder   Pain Orientation Right   Pain Descriptors / Indicators Sore   Pain Type Surgical pain            [facility] PT Assessment - 03/05/17 0001      Assessment   Medical Diagnosis Right Shoulder Arthroscopy 02/07/17   Onset Date/Surgical Date 02/07/17   Hand Dominance Right   Next MD Visit 03/19/17   Prior Therapy yes, for back      Precautions   Precautions Shoulder   Type of Shoulder Precautions No lifting greater than 5# over next two weeks   Required Braces or Orthoses Sling                     [facility] Adult PT Treatment/Exercise - 03/05/17 0001      Modalities   Modalities Electrical Stimulation;Vasopneumatic     Electrical Stimulation   Electrical Stimulation Location R shoulder    Electrical Stimulation Action IFC   Electrical Stimulation Parameters 1-10 hz x15 min   Electrical Stimulation Goals Pain;Edema     Vasopneumatic   Number Minutes Vasopneumatic  15 minutes  Vasopnuematic Location  Shoulder   Vasopneumatic Pressure Low   Vasopneumatic Temperature  34     Manual Therapy   Manual Therapy Passive ROM   Passive ROM PROM of R shoulder into flex/ER/IR per protocol limitations and R elbow into flexion and extension per protocol limitations; Sub max R shoulder isometrics in ER/IR x10 reps each with 5 sec hold; Rhythmic stabs in ER/IR x3 min                  PT Short Term Goals - 02/19/17 1010      PT SHORT TERM GOAL #1   Title Pt will be independent in his HEP.   Time 3   Period Weeks   Status New           PT Long Term Goals - 02/19/17 1010      PT LONG TERM GOAL #1   Title Patient will be independent in his HEP and progression.    Time 8   Period Weeks   Status New     PT LONG TERM GOAL #2   Title Patient  will improve his FOTO score from 52% limiitation to >/= 34% limitation   Time 8   Period Weeks   Status New     PT LONG TERM GOAL #3   Title Patient will be able to perform Right shoulder flexion to >/= 140 degrees with pain less than 3/10.    Time 8   Period Weeks   Status New     PT LONG TERM GOAL #4   Title Patient will be able to perform ADL's with no pain.   Time 8   Period Weeks   Status New     PT LONG TERM GOAL #5   Title Patient will increase his R shoulder flexion and abduction strength to >/= 4/5 in order to improve functional mobility.   Time 8   Period Weeks   Status New               Plan - 03/05/17 0803    Clinical Impression Statement Patient presented in clinic with low level R shoulder soreness and with sling donned at time of arrival in clinic. Patient denied any pain or soreness during PROM of R shoulder into flex/ER/IR. Patient did report "feeling it" in posteriolateral R shoulder during shoulder abduction isometric but none at no other time. Fairly good R shoulder proximal stabilization noted with gentle rhythmic stabilizations in ER/IR.Marland Kitchen Normal modalities response noted following removal of the modalities.   Rehab Potential Excellent   PT Frequency 2x / week   PT Duration 8 weeks   PT Treatment/Interventions ADLs/Self Care Home Management;Cryotherapy;Electrical Stimulation;Ultrasound;Neuromuscular re-education;Therapeutic exercise;Therapeutic activities;Functional mobility training;Patient/family education;Manual techniques;Passive range of motion;Dry needling;Taping;Vasopneumatic Device   PT Next Visit Plan Continue per SLAP protocol with modalities per MPT POC.   PT Home Exercise Plan Pendulums, cane flexion and ER   Consulted and Agree with Plan of Care Patient      Patient will benefit from skilled therapeutic intervention in order to improve the following deficits and impairments:  Postural dysfunction, Decreased range of motion, Impaired UE  functional use, Pain, Decreased strength, Decreased activity tolerance  Visit Diagnosis: Acute pain of right shoulder  Stiffness of right shoulder, not elsewhere classified  Localized edema  Shoulder weakness     Problem List Patient Active Problem List   Diagnosis Date Noted  . Guaiac positive stools 10/24/2016  . Precordial pain 12/30/2012  . HTN (hypertension)   .  Diabetes mellitus type 2, noninsulin dependent (HCC)   . Hyperlipidemia   . Morbid obesity (HCC)   . CKD (chronic kidney disease) stage 3, GFR 30-59 ml/min   . Anginal pain (HCC) 12/29/2012    Evelene CroonKelsey M Parsons, PTA 03/05/2017, 8:16 AM  [facility] [address] [place], [place], [address] Phone: [phone]   Fax:  [phone]  Name: [name] MRN: [id number] Date of Birth: Sep 13, 1961

## 2017-03-11 ENCOUNTER — Ambulatory Visit: Payer: Medicare Other | Admitting: Physical Therapy

## 2017-03-11 ENCOUNTER — Encounter: Payer: Self-pay | Admitting: Physical Therapy

## 2017-03-11 DIAGNOSIS — M25611 Stiffness of right shoulder, not elsewhere classified: Secondary | ICD-10-CM

## 2017-03-11 DIAGNOSIS — M25511 Pain in right shoulder: Secondary | ICD-10-CM | POA: Diagnosis not present

## 2017-03-11 DIAGNOSIS — R600 Localized edema: Secondary | ICD-10-CM

## 2017-03-11 DIAGNOSIS — R29898 Other symptoms and signs involving the musculoskeletal system: Secondary | ICD-10-CM

## 2017-03-11 NOTE — Therapy (Signed)
[facility] [address] [place], [place], [address] Phone: [phone]   Fax:  [phone]  Physical Therapy Treatment  Patient Details  Name: [name] MRN: [id number] Date of Birth: [birth year] Referring Provider: Dr. Viviann Spare Case  Encounter Date: 03/11/2017      PT End of Session - 03/11/17 0814    Visit Number 7   Number of Visits 16   Date for PT Re-Evaluation 04/21/17   Authorization Type UHC/Medicare   PT Start Time 0817   PT Stop Time 0901   PT Time Calculation (min) 44 min   Activity Tolerance Patient tolerated treatment well   Behavior During Therapy [facility] for tasks assessed/performed      Past Medical History:  Diagnosis Date  . Anginal pain (HCC) 12/29/2012  . Chronic low back pain   . CKD (chronic kidney disease) stage 3, GFR 30-59 ml/min   . Diabetes mellitus type 2, noninsulin dependent (HCC)   . Exertional shortness of breath   . GERD (gastroesophageal reflux disease)   . H/O hiatal hernia   . Headache(784.0)    "not daily, not weekly, but very often" (12/30/2012)  . HTN (hypertension)   . Hyperlipidemia   . Morbid obesity (HCC)   . Osteoarthritis    "hands" (12/30/2012)    Past Surgical History:  Procedure Laterality Date  . ELBOW ARTHROPLASTY Left 1990's   "3 spurs; ?14 staples" (12/30/2012)  . LEFT HEART CATHETERIZATION WITH CORONARY ANGIOGRAM N/A 12/31/2012   Procedure: LEFT HEART CATHETERIZATION WITH CORONARY ANGIOGRAM;  Surgeon: Tonny Bollman, MD;  Location: [facility] CATH LAB;  Service: Cardiovascular;  Laterality: N/A;  . LUMBAR DISC SURGERY  2010; 2011   "2 ruptured discs; 2 pinched nerves"    There were no vitals filed for this visit.      Subjective Assessment - 03/11/17 0814    Subjective Reports that his shoulder is rough since not using sling anymore. Reports soreness this morning and a pain in lateral R elbow with elbow extension and has noticed that since coming out of sling.   Pertinent History Sling until 03/05/17 per pt report from Dr. Case   How long can you sit comfortably? unlimied   How long can you stand comfortably? unlimited   How long can you walk comfortably? unlimited   Currently in Pain? Yes   Pain Score 4    Pain Location Shoulder   Pain Orientation Right   Pain Descriptors / Indicators Sore   Pain Type Surgical pain            [facility] PT Assessment - 03/11/17 0001      Assessment   Medical Diagnosis Right Shoulder Arthroscopy 02/07/17   Onset Date/Surgical Date 02/07/17   Hand Dominance Right   Next MD Visit 03/19/17   Prior Therapy yes, for back      Precautions   Precautions Shoulder   Type of Shoulder Precautions No lifting greater than 5# over next two weeks   Required Braces or Orthoses Sling                     [facility] Adult PT Treatment/Exercise - 03/11/17 0001      Modalities   Modalities Electrical Stimulation;Vasopneumatic     Electrical Stimulation   Electrical Stimulation Location R shoulder    Electrical Stimulation Action IFC   Electrical Stimulation Parameters 1-10 hz x15 min   Electrical Stimulation Goals Pain;Edema     Vasopneumatic   Number Minutes Vasopneumatic  15 minutes   Vasopnuematic Location  Shoulder   Vasopneumatic Pressure Low   Vasopneumatic Temperature  55     Manual Therapy   Manual Therapy Passive ROM   Passive ROM PROM of R shoulder into flex/ER/IR per protocol limitations and R elbow into flexion and extension per protocol limitations; Sub max R shoulder isometrics in ER/IR/abd/add x15 reps each with 3 sec hold                  PT Short Term Goals - 02/19/17 1010      PT SHORT TERM GOAL #1   Title Pt will be independent in his HEP.   Time 3   Period Weeks   Status New           PT Long Term Goals - 02/19/17 1010      PT LONG TERM GOAL #1   Title Patient will be independent in his HEP and progression.    Time 8   Period Weeks   Status New     PT LONG  TERM GOAL #2   Title Patient will improve his FOTO score from 52% limiitation to >/= 34% limitation   Time 8   Period Weeks   Status New     PT LONG TERM GOAL #3   Title Patient will be able to perform Right shoulder flexion to >/= 140 degrees with pain less than 3/10.    Time 8   Period Weeks   Status New     PT LONG TERM GOAL #4   Title Patient will be able to perform ADL's with no pain.   Time 8   Period Weeks   Status New     PT LONG TERM GOAL #5   Title Patient will increase his R shoulder flexion and abduction strength to >/= 4/5 in order to improve functional mobility.   Time 8   Period Weeks   Status New               Plan - 03/11/17 0854    Clinical Impression Statement Patient continues to present in clinic with reports of R shoulder soreness especially following dismissal of sling per MD direction. Upon patient's report of R elbow pain with elbow extension R lateral elbow palpated with no abnormal tone palpated. Patient is primarily at end range R elbow extension per patient report. Firm end feels noted in all directions of PROM of R shoulder within protocol limits. Good R elbow isometrics completed today in ER/IR/abduct/adduct  per protocol limits with no discomfort reported by patient. Normal modalities response noted following removal of the modalities.   Rehab Potential Excellent   PT Duration 8 weeks   PT Treatment/Interventions ADLs/Self Care Home Management;Cryotherapy;Electrical Stimulation;Ultrasound;Neuromuscular re-education;Therapeutic exercise;Therapeutic activities;Functional mobility training;Patient/family education;Manual techniques;Passive range of motion;Dry needling;Taping;Vasopneumatic Device   PT Next Visit Plan Continue per SLAP protocol with modalities per MPT POC.   PT Home Exercise Plan Pendulums, cane flexion and ER   Consulted and Agree with Plan of Care Patient      Patient will benefit from skilled therapeutic intervention in order  to improve the following deficits and impairments:  Postural dysfunction, Decreased range of motion, Impaired UE functional use, Pain, Decreased strength, Decreased activity tolerance  Visit Diagnosis: Acute pain of right shoulder  Stiffness of right shoulder, not elsewhere classified  Localized edema  Shoulder weakness     Problem List Patient Active Problem List   Diagnosis Date Noted  . Guaiac positive stools  10/24/2016  . Precordial pain 12/30/2012  . HTN (hypertension)   . Diabetes mellitus type 2, noninsulin dependent (HCC)   . Hyperlipidemia   . Morbid obesity (HCC)   . CKD (chronic kidney disease) stage 3, GFR 30-59 ml/min   . Anginal pain (HCC) 12/29/2012    Evelene Croon, PTA 03/11/2017, 9:07 AM  [facility] [address] [place], [place], [address] Phone: [phone]   Fax:  [phone]  Name: [name] MRN: [id number] Date of Birth: 1960/12/30

## 2017-03-13 ENCOUNTER — Ambulatory Visit: Payer: Medicare Other | Admitting: Physical Therapy

## 2017-03-13 ENCOUNTER — Encounter: Payer: Self-pay | Admitting: Physical Therapy

## 2017-03-13 DIAGNOSIS — R29898 Other symptoms and signs involving the musculoskeletal system: Secondary | ICD-10-CM

## 2017-03-13 DIAGNOSIS — M25511 Pain in right shoulder: Secondary | ICD-10-CM | POA: Diagnosis not present

## 2017-03-13 DIAGNOSIS — M25611 Stiffness of right shoulder, not elsewhere classified: Secondary | ICD-10-CM

## 2017-03-13 DIAGNOSIS — R600 Localized edema: Secondary | ICD-10-CM

## 2017-03-13 NOTE — Therapy (Signed)
[facility] [address] [place], [place], [address] Phone: [phone]   Fax:  [phone]  Physical Therapy Treatment  Patient Details  Name: [name] W Magnan MRN: [id number] Date of Birth: 30-Sep-1961 Referring Provider: Dr. Viviann SpareSteven Case  Encounter Date: 03/13/2017      PT End of Session - 03/13/17 0728    Visit Number 8   Number of Visits 16   Date for PT Re-Evaluation 04/21/17   Authorization Type UHC/Medicare   PT Start Time 0731   PT Stop Time 0816   PT Time Calculation (min) 45 min   Activity Tolerance Patient tolerated treatment well   Behavior [facility] Therapy [facility] for tasks assessed/performed      Past Medical History:  Diagnosis Date  . Anginal pain (HCC) 12/29/2012  . Chronic low back pain   . CKD (chronic kidney disease) stage 3, GFR 30-59 ml/min   . Diabetes mellitus type 2, noninsulin dependent (HCC)   . Exertional shortness of breath   . GERD (gastroesophageal reflux disease)   . H/O hiatal hernia   . Headache(784.0)    "not daily, not weekly, but very often" (12/30/2012)  . HTN (hypertension)   . Hyperlipidemia   . Morbid obesity (HCC)   . Osteoarthritis    "hands" (12/30/2012)    Past Surgical History:  Procedure Laterality Date  . ELBOW ARTHROPLASTY Left 1990's   "3 spurs; ?14 staples" (12/30/2012)  . LEFT HEART CATHETERIZATION WITH CORONARY ANGIOGRAM N/A 12/31/2012   Procedure: LEFT HEART CATHETERIZATION WITH CORONARY ANGIOGRAM;  Surgeon: Tonny BollmanMichael Cooper, MD;  [facility];  Service: Cardiovascular;  Laterality: N/A;  . LUMBAR DISC SURGERY  2010; 2011   "2 ruptured discs; 2 pinched nerves"    There were no vitals filed for this visit.      Subjective Assessment - 03/13/17 0728    Subjective Reports that his shoulder feeling "pretty good today."   Pertinent History Sling until 03/05/17 per pt report from Dr. Case   How long can you sit comfortably? unlimied   How long can you stand  comfortably? unlimited   How long can you walk comfortably? unlimited   Currently in Pain? Yes   Pain Score 3    Pain Location Shoulder   Pain Orientation Right   Pain Descriptors / Indicators Sore   Pain Type Surgical pain            [facility] PT Assessment - 03/13/17 0001      Assessment   Medical Diagnosis Right Shoulder Arthroscopy 02/07/17   Onset Date/Surgical Date 02/07/17   Hand Dominance Right   Next MD Visit 03/19/17   Prior Therapy yes, for back      Precautions   Precautions Shoulder   Type of Shoulder Precautions No lifting greater than 5# over next two weeks                     [facility] - 03/13/17 0001      Modalities   Modalities Electrical Stimulation;Vasopneumatic     Electrical Stimulation   Electrical Stimulation Location R shoulder   Electrical Stimulation Action IFC   Electrical Stimulation Parameters 1-10 hz x15 min   Electrical Stimulation Goals Pain;Edema     Vasopneumatic   Number Minutes Vasopneumatic  15 minutes   Vasopnuematic Location  Shoulder   Vasopneumatic Pressure Low   Vasopneumatic Temperature  57     Manual Therapy   Manual Therapy Passive ROM  Passive ROM PROM of R shoulder into flex/ER/IR per protocol limitations; Sub max R shoulder isometrics in ER/IR/abd/add x15 reps each with 3 sec hold; Rhythmic stabilizations of R shoulder ER/IR                   PT Short Term Goals - 02/19/17 1010      PT SHORT TERM GOAL #1   Title Pt will be independent in his HEP.   Time 3   Period Weeks   Status New           PT Long Term Goals - 02/19/17 1010      PT LONG TERM GOAL #1   Title Patient will be independent in his HEP and progression.    Time 8   Period Weeks   Status New     PT LONG TERM GOAL #2   Title Patient will improve his FOTO score from 52% limiitation to >/= 34% limitation   Time 8   Period Weeks   Status New     PT LONG TERM GOAL #3   Title Patient will be  able to perform Right shoulder flexion to >/= 140 degrees with pain less than 3/10.    Time 8   Period Weeks   Status New     PT LONG TERM GOAL #4   Title Patient will be able to perform ADL's with no pain.   Time 8   Period Weeks   Status New     PT LONG TERM GOAL #5   Title Patient will increase his R shoulder flexion and abduction strength to >/= 4/5 in order to improve functional mobility.   Time 8   Period Weeks   Status New               Plan - 03/13/17 0803    Clinical Impression Statement Patient continues to tolerate treatment well for 5 weeks post surgery. No complaints of any R elbow pain and only report of tolerable discomfort at end range PROM into flexion. Good sub maximal R shoulder isometrics in ER/IR/abduct/adduct observed today. Fairly good R shoulder stability noted with rhythmic stabilizations in ER/IR today. Normal modalities response noted following removal of the modalities.   Rehab Potential Excellent   PT Frequency 2x / week   PT Duration 8 weeks   PT Treatment/Interventions ADLs/Self Care Home Management;Cryotherapy;Electrical Stimulation;Ultrasound;Neuromuscular re-education;Therapeutic exercise;Therapeutic activities;Functional mobility training;Patient/family education;Manual techniques;Passive range of motion;Dry needling;Taping;Vasopneumatic Device   PT Next Visit Plan Continue per SLAP protocol with modalities per MPT POC.   PT Home Exercise Plan Pendulums, cane flexion and ER   Consulted and Agree with Plan of Care Patient      Patient will benefit from skilled therapeutic intervention in order to improve the following deficits and impairments:  Postural dysfunction, Decreased range of motion, Impaired UE functional use, Pain, Decreased strength, Decreased activity tolerance  Visit Diagnosis: Acute pain of right shoulder  Stiffness of right shoulder, not elsewhere classified  Localized edema  Shoulder weakness     Problem  List Patient Active Problem List   Diagnosis Date Noted  . Guaiac positive stools 10/24/2016  . Precordial pain 12/30/2012  . HTN (hypertension)   . Diabetes mellitus type 2, noninsulin dependent (HCC)   . Hyperlipidemia   . Morbid obesity (HCC)   . CKD (chronic kidney disease) stage 3, GFR 30-59 ml/min   . Anginal pain (HCC) 12/29/2012    Evelene Croon, PTA 03/13/2017, 8:21 AM  [facility] [address] [place], [place], [address] Phone: [phone]   Fax:  [phone]  Name: LOVETT COFFIN MRN: [id number] Date of Birth: 1961-02-10

## 2017-03-17 ENCOUNTER — Ambulatory Visit: Payer: Medicare Other | Attending: Orthopedic Surgery | Admitting: Physical Therapy

## 2017-03-17 ENCOUNTER — Encounter: Payer: Self-pay | Admitting: Physical Therapy

## 2017-03-17 DIAGNOSIS — R600 Localized edema: Secondary | ICD-10-CM

## 2017-03-17 DIAGNOSIS — M25511 Pain in right shoulder: Secondary | ICD-10-CM

## 2017-03-17 DIAGNOSIS — M25611 Stiffness of right shoulder, not elsewhere classified: Secondary | ICD-10-CM | POA: Diagnosis present

## 2017-03-17 DIAGNOSIS — R29898 Other symptoms and signs involving the musculoskeletal system: Secondary | ICD-10-CM

## 2017-03-17 NOTE — Therapy (Signed)
[facility] [address] [place], [place], [address] Phone: [phone]   Fax:  [phone]  Physical Therapy Treatment  Patient Details  Name: [name] MRN: [id number] Date of Birth: 06/01/1961 Referring Provider: Dr. Viviann Spare Case  Encounter Date: 03/17/2017      PT End of Session - 03/17/17 0805    Visit Number 9   Number of Visits 16   Date for PT Re-Evaluation 04/21/17   Authorization Type UHC/Medicare   PT Start Time 0729   PT Stop Time 0820   PT Time Calculation (min) 51 min   Activity Tolerance Patient tolerated treatment well   Behavior During Therapy [facility] for tasks assessed/performed      Past Medical History:  Diagnosis Date  . Anginal pain (HCC) 12/29/2012  . Chronic low back pain   . CKD (chronic kidney disease) stage 3, GFR 30-59 ml/min   . Diabetes mellitus type 2, noninsulin dependent (HCC)   . Exertional shortness of breath   . GERD (gastroesophageal reflux disease)   . H/O hiatal hernia   . Headache(784.0)    "not daily, not weekly, but very often" (12/30/2012)  . HTN (hypertension)   . Hyperlipidemia   . Morbid obesity (HCC)   . Osteoarthritis    "hands" (12/30/2012)    Past Surgical History:  Procedure Laterality Date  . ELBOW ARTHROPLASTY Left 1990's   "3 spurs; ?14 staples" (12/30/2012)  . LEFT HEART CATHETERIZATION WITH CORONARY ANGIOGRAM N/A 12/31/2012   Procedure: LEFT HEART CATHETERIZATION WITH CORONARY ANGIOGRAM;  Surgeon: Tonny Bollman, MD;  Location: [facility] CATH LAB;  Service: Cardiovascular;  Laterality: N/A;  . LUMBAR DISC SURGERY  2010; 2011   "2 ruptured discs; 2 pinched nerves"    There were no vitals filed for this visit.      Subjective Assessment - 03/17/17 0744    Subjective Patient reported doing well overall, no complaints after last treatment   Pertinent History Sling until 03/05/17 per pt report from Dr. Case   How long can you sit comfortably? unlimied   How long can you  stand comfortably? unlimited   How long can you walk comfortably? unlimited   Currently in Pain? Yes   Pain Score 3    Pain Location Shoulder   Pain Orientation Right   Pain Descriptors / Indicators Sore   Pain Type Surgical pain   Aggravating Factors  certain movements   Pain Relieving Factors at rest            [facility] PT Assessment - 03/17/17 0001      ROM / Strength   AROM / PROM / Strength PROM     AROM   Right/Left Shoulder Right   Right Shoulder Flexion 122 Degrees   Right Shoulder External Rotation 45 Degrees                     [facility] Adult PT Treatment/Exercise - 03/17/17 0001      Electrical Stimulation   Electrical Stimulation Location R shoulder   Electrical Stimulation Action IFC   Electrical Stimulation Parameters 1-10hz  x85min   Electrical Stimulation Goals Pain;Edema     Vasopneumatic   Number Minutes Vasopneumatic  15 minutes   Vasopnuematic Location  Shoulder   Vasopneumatic Pressure Low     Manual Therapy   Manual Therapy Passive ROM   Passive ROM PROM of R shoulder into flex/ER/IR per protocol limitations; Sub max R shoulder isometrics in ER/IR/abd/add x15 reps each with 3  sec hold; Rhythmic stabilizations of R shoulder ER/IR                   PT Short Term Goals - 03/17/17 0806      PT SHORT TERM GOAL #1   Title Pt will be independent in his HEP.   Time 3   Period Weeks   Status On-going           PT Long Term Goals - 03/17/17 1610      PT LONG TERM GOAL #1   Title Patient will be independent in his HEP and progression.    Time 8   Period Weeks   Status On-going     PT LONG TERM GOAL #2   Title Patient will improve his FOTO score from 52% limiitation to >/= 34% limitation   Time 8   Period Weeks   Status On-going     PT LONG TERM GOAL #3   Title Patient will be able to perform Right shoulder flexion to >/= 140 degrees with pain less than 3/10.    Time 8   Period Weeks   Status On-going     PT LONG  TERM GOAL #4   Title Patient will be able to perform ADL's with no pain.   Time 8   Period Weeks   Status On-going     PT LONG TERM GOAL #5   Title Patient will increase his R shoulder flexion and abduction strength to >/= 4/5 in order to improve functional mobility.   Time 8   Period Weeks   Status On-going               Plan - 03/17/17 0807    Clinical Impression Statement Patient tolerated treatment today and progressing well within protocol limitations. Patient has no complaints of pain only minimal soreness. Patient reported doing HEP daily as instructed per PT. Patient progresing with PROM for right shoulder flexion to 122 degrees and ER to 45 degrees. Patient goals ongoing due to protocol limitations.    Rehab Potential Excellent   Clinical Impairments Affecting Rehab Potential surgery 02/07/17 and current 03/14/17 5 weeks   PT Frequency 2x / week   PT Duration 8 weeks   PT Treatment/Interventions ADLs/Self Care Home Management;Cryotherapy;Electrical Stimulation;Ultrasound;Neuromuscular re-education;Therapeutic exercise;Therapeutic activities;Functional mobility training;Patient/family education;Manual techniques;Passive range of motion;Dry needling;Taping;Vasopneumatic Device   PT Next Visit Plan Continue per SLAP protocol with modalities per MPT POC. MD. Case 03/19/17   Consulted and Agree with Plan of Care Patient      Patient will benefit from skilled therapeutic intervention in order to improve the following deficits and impairments:  Postural dysfunction, Decreased range of motion, Impaired UE functional use, Pain, Decreased strength, Decreased activity tolerance  Visit Diagnosis: Acute pain of right shoulder  Stiffness of right shoulder, not elsewhere classified  Localized edema  Shoulder weakness     Problem List Patient Active Problem List   Diagnosis Date Noted  . Guaiac positive stools 10/24/2016  . Precordial pain 12/30/2012  . HTN (hypertension)   .  Diabetes mellitus type 2, noninsulin dependent (HCC)   . Hyperlipidemia   . Morbid obesity (HCC)   . CKD (chronic kidney disease) stage 3, GFR 30-59 ml/min   . Anginal pain [facility]) 12/29/2012    Cathie Hoops, PTA 03/17/17 9:35 AM  [place] MPT [facility] [address] [place], [place], [address] Phone: [phone]   Fax:  [phone]  Name: [name] [facility] MRN:  [id number] Date of Birth: 1961/08/28

## 2017-03-20 ENCOUNTER — Encounter: Payer: Self-pay | Admitting: Physical Therapy

## 2017-03-20 ENCOUNTER — Ambulatory Visit: Payer: Medicare Other | Admitting: Physical Therapy

## 2017-03-20 DIAGNOSIS — R600 Localized edema: Secondary | ICD-10-CM

## 2017-03-20 DIAGNOSIS — M25611 Stiffness of right shoulder, not elsewhere classified: Secondary | ICD-10-CM

## 2017-03-20 DIAGNOSIS — R29898 Other symptoms and signs involving the musculoskeletal system: Secondary | ICD-10-CM

## 2017-03-20 DIAGNOSIS — M25511 Pain in right shoulder: Secondary | ICD-10-CM | POA: Diagnosis not present

## 2017-03-20 NOTE — Therapy (Signed)
[facility] [address] [place], [place], [address] Phone: [phone]   Fax:  [phone]  Physical Therapy Treatment  Patient Details  Name: [name] MRN: [id number] Date of Birth: Feb 19, 1961 Referring Provider: Dr. Viviann Spare Case  Encounter Date: 03/20/2017      PT End of Session - 03/20/17 0803    Visit Number 10   Number of Visits 16   Date for PT Re-Evaluation 04/21/17   Authorization Type UHC/Medicare   PT Start Time 0730   PT Stop Time 0817   PT Time Calculation (min) 47 min   Activity Tolerance Patient tolerated treatment well   Behavior During Therapy [facility] for tasks assessed/performed      Past Medical History:  Diagnosis Date  . Anginal pain (HCC) 12/29/2012  . Chronic low back pain   . CKD (chronic kidney disease) stage 3, GFR 30-59 ml/min   . Diabetes mellitus type 2, noninsulin dependent (HCC)   . Exertional shortness of breath   . GERD (gastroesophageal reflux disease)   . H/O hiatal hernia   . Headache(784.0)    "not daily, not weekly, but very often" (12/30/2012)  . HTN (hypertension)   . Hyperlipidemia   . Morbid obesity (HCC)   . Osteoarthritis    "hands" (12/30/2012)    Past Surgical History:  Procedure Laterality Date  . ELBOW ARTHROPLASTY Left 1990's   "3 spurs; ?14 staples" (12/30/2012)  . LEFT HEART CATHETERIZATION WITH CORONARY ANGIOGRAM N/A 12/31/2012   Procedure: LEFT HEART CATHETERIZATION WITH CORONARY ANGIOGRAM;  Surgeon: Tonny Bollman, MD;  Location: [facility] CATH LAB;  Service: Cardiovascular;  Laterality: N/A;  . LUMBAR DISC SURGERY  2010; 2011   "2 ruptured discs; 2 pinched nerves"    There were no vitals filed for this visit.      Subjective Assessment - 03/20/17 0737    Subjective Patient went to MD and is to continue therapy and return in 5 weeks, still on restrictiions per mD   Pertinent History Sling until 03/05/17 per pt report from Dr. Case   How long can you sit comfortably?  unlimied   How long can you stand comfortably? unlimited   How long can you walk comfortably? unlimited   Currently in Pain? Yes   Pain Score 3    Pain Location Shoulder   Pain Orientation Right   Pain Descriptors / Indicators Sore   Pain Type Surgical pain   Pain Onset More than a month ago   Pain Frequency Intermittent   Aggravating Factors  certain movement   Pain Relieving Factors at rest            [facility] PT Assessment - 03/20/17 0001      AROM   Right/Left Shoulder Right   Right Shoulder Flexion 120 Degrees   Right Shoulder External Rotation 45 Degrees                     [facility] Adult PT Treatment/Exercise - 03/20/17 0001      Electrical Stimulation   Electrical Stimulation Location R shoulder   Electrical Stimulation Action IFC   Electrical Stimulation Parameters 1-10hz  x28min   Electrical Stimulation Goals Pain;Edema     Vasopneumatic   Number Minutes Vasopneumatic  15 minutes   Vasopnuematic Location  Shoulder   Vasopneumatic Pressure Low     Manual Therapy   Manual Therapy Passive ROM   Passive ROM PROM of R shoulder into flex/ER/IR per protocol limitations; Sub max R shoulder  isometrics in ER/IR/abd/add x15 reps each with 3 sec hold; Rhythmic stabilizations of R shoulder ER/IR                   PT Short Term Goals - 03/17/17 0806      PT SHORT TERM GOAL #1   Title Pt will be independent in his HEP.   Time 3   Period Weeks   Status On-going           PT Long Term Goals - 03/17/17 1610      PT LONG TERM GOAL #1   Title Patient will be independent in his HEP and progression.    Time 8   Period Weeks   Status On-going     PT LONG TERM GOAL #2   Title Patient will improve his FOTO score from 52% limiitation to >/= 34% limitation   Time 8   Period Weeks   Status On-going     PT LONG TERM GOAL #3   Title Patient will be able to perform Right shoulder flexion to >/= 140 degrees with pain less than 3/10.    Time 8    Period Weeks   Status On-going     PT LONG TERM GOAL #4   Title Patient will be able to perform ADL's with no pain.   Time 8   Period Weeks   Status On-going     PT LONG TERM GOAL #5   Title Patient will increase his R shoulder flexion and abduction strength to >/= 4/5 in order to improve functional mobility.   Time 8   Period Weeks   Status On-going               Plan - 03/20/17 9604    Clinical Impression Statement Patient tolerated treatment well today and continues to progress per protocol. Patient has reported some ongoing minimal discomfort. Patient has been using arm acively with light ADL's. Patient has not lifting any weight but needs to perfom daily routine. Patient goals ongoing due to protocol limitations.    Rehab Potential Excellent   Clinical Impairments Affecting Rehab Potential surgery 02/07/17 and current 03/14/17 5 weeks  FOTO 55% limitation 10th visit   PT Frequency 2x / week   PT Duration 8 weeks   PT Treatment/Interventions ADLs/Self Care Home Management;Cryotherapy;Electrical Stimulation;Ultrasound;Neuromuscular re-education;Therapeutic exercise;Therapeutic activities;Functional mobility training;Patient/family education;Manual techniques;Passive range of motion;Dry needling;Taping;Vasopneumatic Device   PT Next Visit Plan Continue per SLAP protocol with modalities per MPT POC   Consulted and Agree with Plan of Care Patient      Patient will benefit from skilled therapeutic intervention in order to improve the following deficits and impairments:  Postural dysfunction, Decreased range of motion, Impaired UE functional use, Pain, Decreased strength, Decreased activity tolerance  Visit Diagnosis: Acute pain of right shoulder  Stiffness of right shoulder, not elsewhere classified  Localized edema  Shoulder weakness     Problem List Patient Active Problem List   Diagnosis Date Noted  . Guaiac positive stools 10/24/2016  . Precordial pain 12/30/2012   . HTN (hypertension)   . Diabetes mellitus type 2, noninsulin dependent (HCC)   . Hyperlipidemia   . Morbid obesity (HCC)   . CKD (chronic kidney disease) stage 3, GFR 30-59 ml/min   . Anginal pain [facility]) 12/29/2012   Cathie Hoops, PTA 03/20/17 8:26 AM    [facility] [address] [place], [place], [address] Phone: [phone]   Fax:  [phone]  Name:  [name] MRN: [id number] Date of Birth: September 07, 1961

## 2017-03-24 ENCOUNTER — Ambulatory Visit: Payer: Medicare Other | Admitting: Physical Therapy

## 2017-03-24 ENCOUNTER — Encounter: Payer: Self-pay | Admitting: Physical Therapy

## 2017-03-24 DIAGNOSIS — M25611 Stiffness of right shoulder, not elsewhere classified: Secondary | ICD-10-CM

## 2017-03-24 DIAGNOSIS — M25511 Pain in right shoulder: Secondary | ICD-10-CM

## 2017-03-24 DIAGNOSIS — R600 Localized edema: Secondary | ICD-10-CM

## 2017-03-24 DIAGNOSIS — R29898 Other symptoms and signs involving the musculoskeletal system: Secondary | ICD-10-CM

## 2017-03-24 NOTE — Therapy (Signed)
[facility] [address] [place], [place], [address] Phone: [phone]   Fax:  [phone]  Physical Therapy Treatment  Patient Details  Name: [name] MRN: [id number] Date of Birth: 1960-12-23 Referring Provider: Dr. Viviann Spare Case  Encounter Date: 03/24/2017      PT End of Session - 03/24/17 0733    Visit Number 11   Number of Visits 16   Date for PT Re-Evaluation 04/21/17   Authorization Type UHC/Medicare   PT Start Time 0733   PT Stop Time 0819   PT Time Calculation (min) 46 min   Activity Tolerance Patient tolerated treatment well   Behavior During Therapy [facility] for tasks assessed/performed      Past Medical History:  Diagnosis Date  . Anginal pain (HCC) 12/29/2012  . Chronic low back pain   . CKD (chronic kidney disease) stage 3, GFR 30-59 ml/min   . Diabetes mellitus type 2, noninsulin dependent (HCC)   . Exertional shortness of breath   . GERD (gastroesophageal reflux disease)   . H/O hiatal hernia   . Headache(784.0)    "not daily, not weekly, but very often" (12/30/2012)  . HTN (hypertension)   . Hyperlipidemia   . Morbid obesity (HCC)   . Osteoarthritis    "hands" (12/30/2012)    Past Surgical History:  Procedure Laterality Date  . ELBOW ARTHROPLASTY Left 1990's   "3 spurs; ?14 staples" (12/30/2012)  . LEFT HEART CATHETERIZATION WITH CORONARY ANGIOGRAM N/A 12/31/2012   Procedure: LEFT HEART CATHETERIZATION WITH CORONARY ANGIOGRAM;  Surgeon: Tonny Bollman, MD;  Location: [facility] CATH LAB;  Service: Cardiovascular;  Laterality: N/A;  . LUMBAR DISC SURGERY  2010; 2011   "2 ruptured discs; 2 pinched nerves"    There were no vitals filed for this visit.      Subjective Assessment - 03/24/17 0731    Subjective Reports that his shoulder feels alright this morning. Reports yesterday after church he was sitting with RUE on armrest and states that he had a shock pain from shoulder to fingertips and that was the  first episode per patient report.   Pertinent History Sling until 03/05/17 per pt report from Dr. Case   How long can you sit comfortably? unlimied   How long can you stand comfortably? unlimited   How long can you walk comfortably? unlimited   Currently in Pain? No/denies            [facility] PT Assessment - 03/24/17 0001      Assessment   Medical Diagnosis Right Shoulder Arthroscopy 02/07/17   Onset Date/Surgical Date 02/07/17   Hand Dominance Right   Next MD Visit 04/24/2017   Prior Therapy yes, for back      Precautions   Precautions Shoulder   Type of Shoulder Precautions No lifting greater than 5# over next two weeks                     [facility] Adult PT Treatment/Exercise - 03/24/17 0001      Shoulder Exercises: Supine   Internal Rotation AROM;Right;20 reps   ABduction AROM;Right;20 reps   ABduction Limitations with elbow flexion to reduce Bicep activation     Shoulder Exercises: Sidelying   Flexion AROM;Right;20 reps     Modalities   Modalities Patent attorney Action IFC   Electrical Stimulation Parameters 1-10 hz x15 min   Electrical Stimulation Goals  Pain     Vasopneumatic   Number Minutes Vasopneumatic  15 minutes   Vasopnuematic Location  Shoulder   Vasopneumatic Pressure Low   Vasopneumatic Temperature  34     Manual Therapy   Manual Therapy Passive ROM   Passive ROM PROM of R shoulder into flex/ER/IR per protocol limitations; Sub max R shoulder isometrics in ER/IR/abd/add x20 reps each with 3 sec hold; Rhythmic stabilizations of R shoulder ER/IR                   PT Short Term Goals - 03/17/17 0806      PT SHORT TERM GOAL #1   Title Pt will be independent in his HEP.   Time 3   Period Weeks   Status On-going           PT Long Term Goals - 03/17/17 16100806      PT LONG TERM GOAL #1   Title Patient will be  independent in his HEP and progression.    Time 8   Period Weeks   Status On-going     PT LONG TERM GOAL #2   Title Patient will improve his FOTO score from 52% limiitation to >/= 34% limitation   Time 8   Period Weeks   Status On-going     PT LONG TERM GOAL #3   Title Patient will be able to perform Right shoulder flexion to >/= 140 degrees with pain less than 3/10.    Time 8   Period Weeks   Status On-going     PT LONG TERM GOAL #4   Title Patient will be able to perform ADL's with no pain.   Time 8   Period Weeks   Status On-going     PT LONG TERM GOAL #5   Title Patient will increase his R shoulder flexion and abduction strength to >/= 4/5 in order to improve functional mobility.   Time 8   Period Weeks   Status On-going               Plan - 03/24/17 0807    Clinical Impression Statement Patient tolerated today's treatment fairly well with the introduction to active ROM exercises with only "tolerable" discomfort reported by patient during therapeutic exercise. Patient educated that shooting pain he experienced may be caused by nerve regeneration/healing and to report back to us if pain worsens. Very minimal R shoulder popping noted with PROM in flexion today but no negetive reports provided by patient. Patient able to tolerate increased reps of isometrics in all directions with no reports of discomfort. Fair R shoulder stability noted with rhythmic stabilizations in ER/IR today. Normal modalities response noted following removal of the modalities. Patient educated that he may experience some soreness in R shoulder from initiation of new exercises.   Rehab Potential Excellent   Clinical Impairments Affecting Rehab Potential surgery 02/07/17 and current 03/14/17 5 weeks  FOTO 55% limitation 10th visit   PT Frequency 2x / week   PT Duration 8 weeks   PT Treatment/Interventions ADLs/Self Care Home Management;Cryotherapy;Electrical Stimulation;Ultrasound;Neuromuscular  re-education;Therapeutic exercise;Therapeutic activities;Functional mobility training;Patient/family education;Manual techniques;Passive range of motion;Dry needling;Taping;Vasopneumatic Device   PT Next Visit Plan Continue per SLAP protocol with modalities per MPT POC   PT Home Exercise Plan Pendulums, cane flexion and ER   Consulted and Agree with Plan of Care Patient      Patient will benefit from skilled therapeutic intervention in order to improve the following deficits and impairments:  Postural dysfunction, Decreased range of motion, Impaired UE functional use, Pain, Decreased strength, Decreased activity tolerance  Visit Diagnosis: Acute pain of right shoulder  Stiffness of right shoulder, not elsewhere classified  Localized edema  Shoulder weakness     Problem List Patient Active Problem List   Diagnosis Date Noted  . Guaiac positive stools 10/24/2016  . Precordial pain 12/30/2012  . HTN (hypertension)   . Diabetes mellitus type 2, noninsulin dependent (HCC)   . Hyperlipidemia   . Morbid obesity (HCC)   . CKD (chronic kidney disease) stage 3, GFR 30-59 ml/min   . Anginal pain (HCC) 12/29/2012    Evelene Croon, PTA 03/24/2017, 8:21 AM  [facility] [address] [place], [place], [address] Phone: [phone]   Fax:  [phone]  Name: [name] MRN: [id number] Date of Birth: [birth year]

## 2017-03-27 ENCOUNTER — Encounter: Payer: Self-pay | Admitting: Physical Therapy

## 2017-03-27 ENCOUNTER — Ambulatory Visit: Payer: Medicare Other | Admitting: Physical Therapy

## 2017-03-27 DIAGNOSIS — M25511 Pain in right shoulder: Secondary | ICD-10-CM

## 2017-03-27 DIAGNOSIS — R29898 Other symptoms and signs involving the musculoskeletal system: Secondary | ICD-10-CM

## 2017-03-27 DIAGNOSIS — R600 Localized edema: Secondary | ICD-10-CM

## 2017-03-27 DIAGNOSIS — M25611 Stiffness of right shoulder, not elsewhere classified: Secondary | ICD-10-CM

## 2017-03-27 NOTE — Therapy (Signed)
[facility] [address] [place], [place], [address] Phone: [phone]   Fax:  [phone]  Physical Therapy Treatment  Patient Details  Name: [name] MRN: [id number] Date of Birth: 19-Nov-1960 Referring Provider: Dr. Viviann Spare Case  Encounter Date: 03/27/2017      PT End of Session - 03/27/17 0733    Visit Number 12   Number of Visits 16   Date for PT Re-Evaluation 04/21/17   Authorization Type UHC/Medicare   PT Start Time 0733   PT Stop Time 0818   PT Time Calculation (min) 45 min   Activity Tolerance Patient tolerated treatment well   Behavior During Therapy [facility] for tasks assessed/performed      Past Medical History:  Diagnosis Date  . Anginal pain (HCC) 12/29/2012  . Chronic low back pain   . CKD (chronic kidney disease) stage 3, GFR 30-59 ml/min   . Diabetes mellitus type 2, noninsulin dependent (HCC)   . Exertional shortness of breath   . GERD (gastroesophageal reflux disease)   . H/O hiatal hernia   . Headache(784.0)    "not daily, not weekly, but very often" (12/30/2012)  . HTN (hypertension)   . Hyperlipidemia   . Morbid obesity (HCC)   . Osteoarthritis    "hands" (12/30/2012)    Past Surgical History:  Procedure Laterality Date  . ELBOW ARTHROPLASTY Left 1990's   "3 spurs; ?14 staples" (12/30/2012)  . LEFT HEART CATHETERIZATION WITH CORONARY ANGIOGRAM N/A 12/31/2012   Procedure: LEFT HEART CATHETERIZATION WITH CORONARY ANGIOGRAM;  Surgeon: Tonny Bollman, MD;  Location: [facility] CATH LAB;  Service: Cardiovascular;  Laterality: N/A;  . LUMBAR DISC SURGERY  2010; 2011   "2 ruptured discs; 2 pinched nerves"    There were no vitals filed for this visit.      Subjective Assessment - 03/27/17 0732    Subjective Reports soreness but also bumped his shoulder on the door of his car yesterday.   Pertinent History Sling until 03/05/17 per pt report from Dr. Case   How long can you sit comfortably? unlimied   How  long can you stand comfortably? unlimited   How long can you walk comfortably? unlimited   Currently in Pain? Yes   Pain Score 4    Pain Location Shoulder   Pain Orientation Right   Pain Descriptors / Indicators Sore   Pain Type Surgical pain   Pain Onset More than a month ago            [facility] PT Assessment - 03/27/17 0001      Assessment   Medical Diagnosis Right Shoulder Arthroscopy 02/07/17   Onset Date/Surgical Date 02/07/17   Hand Dominance Right   Next MD Visit 04/24/2017   Prior Therapy yes, for back      Precautions   Precautions Shoulder   Type of Shoulder Precautions No lifting greater than 5# over next two weeks                     [facility] Adult PT Treatment/Exercise - 03/27/17 0001      Modalities   Modalities Electrical Stimulation;Vasopneumatic     Electrical Stimulation   Electrical Stimulation Location R shoulder   Electrical Stimulation Action IFC   Electrical Stimulation Parameters 1-10 hz x15 min   Electrical Stimulation Goals Pain     Vasopneumatic   Number Minutes Vasopneumatic  15 minutes   Vasopnuematic Location  Shoulder   Vasopneumatic Pressure Low   Vasopneumatic Temperature  34     Manual Therapy   Manual Therapy Passive ROM   Passive ROM PROM of R shoulder into flex/ER/IR per protocol limitations; Sub max R shoulder isometrics in ER/IR/abd/add x20 reps each with 3 sec hold; Rhythmic stabilizations of R shoulder ER/IR                   PT Short Term Goals - 03/17/17 0806      PT SHORT TERM GOAL #1   Title Pt will be independent in his HEP.   Time 3   Period Weeks   Status On-going           PT Long Term Goals - 03/17/17 [id number]      PT LONG TERM GOAL #1   Title Patient will be independent in his HEP and progression.    Time 8   Period Weeks   Status On-going     PT LONG TERM GOAL #2   Title Patient will improve his FOTO score from 52% limiitation to >/= 34% limitation   Time 8   Period Weeks    Status On-going     PT LONG TERM GOAL #3   Title Patient will be able to perform Right shoulder flexion to >/= 140 degrees with pain less than 3/10.    Time 8   Period Weeks   Status On-going     PT LONG TERM GOAL #4   Title Patient will be able to perform ADL's with no pain.   Time 8   Period Weeks   Status On-going     PT LONG TERM GOAL #5   Title Patient will increase his R shoulder flexion and abduction strength to >/= 4/5 in order to improve functional mobility.   Time 8   Period Weeks   Status On-going               Plan - 03/27/17 0805    Clinical Impression Statement Patient continues to tolerate treatment well as he arrived with complaints of R shoulder soreness. Only soreness reported by patient during treatment was at end range R shoulder flexion. Patient also reported a pull in shoulder with gentle PROM of R shoulder into ER. Good isomteric contraction into ER/IR/add/ abduct. Fairly good R shoulder stabilization noted today in ER/IR. Normal modalities response noted following removal of the modalities.   Rehab Potential Excellent   Clinical Impairments Affecting Rehab Potential surgery 02/07/17 and current 03/14/17 5 weeks  FOTO 55% limitation 10th visit   PT Frequency 2x / week   PT Duration 8 weeks   PT Treatment/Interventions ADLs/Self Care Home Management;Cryotherapy;Electrical Stimulation;Ultrasound;Neuromuscular re-education;Therapeutic exercise;Therapeutic activities;Functional mobility training;Patient/family education;Manual techniques;Passive range of motion;Dry needling;Taping;Vasopneumatic Device   PT Next Visit Plan Continue with AROM exercises per protocol.   PT Home Exercise Plan Pendulums, cane flexion and ER   Consulted and Agree with Plan of Care Patient      Patient will benefit from skilled therapeutic intervention in order to improve the following deficits and impairments:  Postural dysfunction, Decreased range of motion, Impaired UE functional  use, Pain, Decreased strength, Decreased activity tolerance  Visit Diagnosis: Acute pain of right shoulder  Stiffness of right shoulder, not elsewhere classified  Localized edema  Shoulder weakness     Problem List Patient Active Problem List   Diagnosis Date Noted  . Guaiac positive stools 10/24/2016  . Precordial pain 12/30/2012  . HTN (hypertension)   . Diabetes mellitus type 2, noninsulin dependent (HCC)   .  Hyperlipidemia   . Morbid obesity (HCC)   . CKD (chronic kidney disease) stage 3, GFR 30-59 ml/min   . Anginal pain (HCC) 12/29/2012    Evelene Croon, PTA 03/27/2017, 8:20 AM  [facility] [address] [place], [place], [address] Phone: [phone]   Fax:  [phone]  Name: [name] MRN: [id number] Date of Birth: 19-Jun-1961

## 2017-04-01 ENCOUNTER — Ambulatory Visit: Payer: Medicare Other | Admitting: Physical Therapy

## 2017-04-01 ENCOUNTER — Encounter: Payer: Self-pay | Admitting: Physical Therapy

## 2017-04-01 DIAGNOSIS — M25511 Pain in right shoulder: Secondary | ICD-10-CM

## 2017-04-01 DIAGNOSIS — R29898 Other symptoms and signs involving the musculoskeletal system: Secondary | ICD-10-CM

## 2017-04-01 DIAGNOSIS — R600 Localized edema: Secondary | ICD-10-CM

## 2017-04-01 DIAGNOSIS — M25611 Stiffness of right shoulder, not elsewhere classified: Secondary | ICD-10-CM

## 2017-04-01 NOTE — Therapy (Signed)
[facility] [address] [place], [place], [address] Phone: [phone]   Fax:  [phone]  Physical Therapy Treatment  Patient Details  Name: [name] MRN: [id number] Date of Birth: 1961/07/05 Referring Provider: Dr. Viviann SpareSteven Case  Encounter Date: 04/01/2017      PT End of Session - 04/01/17 0734    Visit Number 13   Number of Visits 16   Date for PT Re-Evaluation 04/21/17   Authorization Type UHC/Medicare   PT Start Time 0730   PT Stop Time 0822   PT Time Calculation (min) 52 min   Activity Tolerance Patient tolerated treatment well   Behavior During Therapy [facility] for tasks assessed/performed      Past Medical History:  Diagnosis Date  . Anginal pain (HCC) 12/29/2012  . Chronic low back pain   . CKD (chronic kidney disease) stage 3, GFR 30-59 ml/min   . Diabetes mellitus type 2, noninsulin dependent (HCC)   . Exertional shortness of breath   . GERD (gastroesophageal reflux disease)   . H/O hiatal hernia   . Headache(784.0)    "not daily, not weekly, but very often" (12/30/2012)  . HTN (hypertension)   . Hyperlipidemia   . Morbid obesity (HCC)   . Osteoarthritis    "hands" (12/30/2012)    Past Surgical History:  Procedure Laterality Date  . ELBOW ARTHROPLASTY Left 1990's   "3 spurs; ?14 staples" (12/30/2012)  . LEFT HEART CATHETERIZATION WITH CORONARY ANGIOGRAM N/A 12/31/2012   Procedure: LEFT HEART CATHETERIZATION WITH CORONARY ANGIOGRAM;  Surgeon: Tonny BollmanMichael Cooper, MD;  [facility];  Service: Cardiovascular;  Laterality: N/A;  . LUMBAR DISC SURGERY  2010; 2011   "2 ruptured discs; 2 pinched nerves"    There were no vitals filed for this visit.      Subjective Assessment - 04/01/17 0732    Subjective Reports 3/10 R shoulder pain today.   Pertinent History Sling until 03/05/17 per pt report from Dr. Case   How long can you sit comfortably? unlimied   How long can you stand comfortably? unlimited   How long can you walk comfortably? unlimited   Currently in Pain? Yes   Pain Score 3    Pain Location Shoulder   Pain Orientation Right   Pain Descriptors / Indicators Discomfort   Pain Type Surgical pain   Pain Onset More than a month ago            [facility] - 04/01/17 0001      Assessment   Medical Diagnosis Right Shoulder Arthroscopy 02/07/17   Onset Date/Surgical Date 02/07/17   Hand Dominance Right   Next MD Visit 04/24/2017   Prior Therapy yes, for back      Precautions   Precautions Shoulder   Type of Shoulder Precautions No lifting greater than 5# over next two weeks                     [facility] Treatment/Exercise - 04/01/17 0001      Shoulder Exercises: Supine   Internal Rotation AROM;Right;20 reps   ABduction AROM;Right;20 reps   ABduction Limitations with elbow flexion to reduce Bicep activation     Shoulder Exercises: Seated   Retraction AROM;Both;20 reps     Shoulder Exercises: Sidelying   Flexion AROM;Right;20 reps     Shoulder Exercises: Pulleys   Flexion 3 minutes     Modalities   Modalities Electrical Stimulation;Vasopneumatic     Electrical Stimulation  Electrical Stimulation Location R shoulder   Electrical Stimulation Action IFC   Electrical Stimulation Parameters 1-10 hz x15 min   Electrical Stimulation Goals Pain     Vasopneumatic   Number Minutes Vasopneumatic  15 minutes   Vasopnuematic Location  Shoulder   Vasopneumatic Pressure Low   Vasopneumatic Temperature  34     Manual Therapy   Manual Therapy Passive ROM   Passive ROM PROM of R shoulder into flex/ER/IR and elbow flexion and extension per protocol limitations; Sub max R shoulder isometrics in ER/IR/abd/add x20 reps each with 3 sec hold                  PT Short Term Goals - 03/17/17 0806      PT SHORT TERM GOAL #1   Title Pt will be independent in his HEP.   Time 3   Period Weeks   Status On-going           PT Long Term  Goals - 03/17/17 1610      PT LONG TERM GOAL #1   Title Patient will be independent in his HEP and progression.    Time 8   Period Weeks   Status On-going     PT LONG TERM GOAL #2   Title Patient will improve his FOTO score from 52% limiitation to >/= 34% limitation   Time 8   Period Weeks   Status On-going     PT LONG TERM GOAL #3   Title Patient will be able to perform Right shoulder flexion to >/= 140 degrees with pain less than 3/10.    Time 8   Period Weeks   Status On-going     PT LONG TERM GOAL #4   Title Patient will be able to perform ADL's with no pain.   Time 8   Period Weeks   Status On-going     PT LONG TERM GOAL #5   Title Patient will increase his R shoulder flexion and abduction strength to >/= 4/5 in order to improve functional mobility.   Time 8   Period Weeks   Status On-going               Plan - 04/01/17 9604    Clinical Impression Statement Patient tolerated today's treatment well with introduction of AROM exercises. Patient educated with pulley system to allow LUE to complete most of the work. All exercises completed on plinth table were completed to reduce the force on Bicep tendon. Limited scapula mobility with scapular squeeze today. Patient educated that soreness may present following treatment. Firm end feels noted with PROM of R shoulder and elbow with smooth arc of motion. Good isometric shoulder contraction noted with IR/ER/abduction/adduction as well. Normal modalities response noted following removal of the modalities.   Rehab Potential Excellent   Clinical Impairments Affecting Rehab Potential surgery 02/07/17 and current 03/14/17 5 weeks  FOTO 55% limitation 10th visit   PT Frequency 2x / week   PT Duration 8 weeks   PT Treatment/Interventions ADLs/Self Care Home Management;Cryotherapy;Electrical Stimulation;Ultrasound;Neuromuscular re-education;Therapeutic exercise;Therapeutic activities;Functional mobility training;Patient/family  education;Manual techniques;Passive range of motion;Dry needling;Taping;Vasopneumatic Device   PT Next Visit Plan Continue with AROM exercises per protocol.   PT Home Exercise Plan Pendulums, cane flexion and ER   Consulted and Agree with Plan of Care Patient      Patient will benefit from skilled therapeutic intervention in order to improve the following deficits and impairments:  Postural dysfunction, Decreased range of motion, Impaired  UE functional use, Pain, Decreased strength, Decreased activity tolerance  Visit Diagnosis: Acute pain of right shoulder  Stiffness of right shoulder, not elsewhere classified  Localized edema  Shoulder weakness     Problem List Patient Active Problem List   Diagnosis Date Noted  . Guaiac positive stools 10/24/2016  . Precordial pain 12/30/2012  . HTN (hypertension)   . Diabetes mellitus type 2, noninsulin dependent (HCC)   . Hyperlipidemia   . Morbid obesity (HCC)   . CKD (chronic kidney disease) stage 3, GFR 30-59 ml/min   . Anginal pain (HCC) 12/29/2012    Evelene Croon, PTA 04/01/2017, 8:26 AM  [facility] [address] [place], [place], [address] Phone: [phone]   Fax:  [phone]  Name: [name] MRN: [id number] Date of Birth: 1961/06/06

## 2017-04-04 ENCOUNTER — Encounter: Payer: Self-pay | Admitting: Physical Therapy

## 2017-04-04 ENCOUNTER — Ambulatory Visit: Payer: Medicare Other | Admitting: Physical Therapy

## 2017-04-04 DIAGNOSIS — M25611 Stiffness of right shoulder, not elsewhere classified: Secondary | ICD-10-CM

## 2017-04-04 DIAGNOSIS — R29898 Other symptoms and signs involving the musculoskeletal system: Secondary | ICD-10-CM

## 2017-04-04 DIAGNOSIS — M25511 Pain in right shoulder: Secondary | ICD-10-CM | POA: Diagnosis not present

## 2017-04-04 DIAGNOSIS — R600 Localized edema: Secondary | ICD-10-CM

## 2017-04-04 NOTE — Therapy (Signed)
[facility] [address] [place], [place], [address] Phone: [phone]   Fax:  [phone]  Physical Therapy Treatment  Patient Details  Name: [name] MRN: [id number] Date of Birth: 25-Jun-1961 Referring Provider: Dr. Viviann Spare Case  Encounter Date: 04/04/2017      PT End of Session - 04/04/17 0735    Visit Number 14   Number of Visits 16   Date for PT Re-Evaluation 04/21/17   Authorization Type UHC/Medicare   PT Start Time 0732   PT Stop Time 0818   PT Time Calculation (min) 46 min   Activity Tolerance Patient tolerated treatment well   Behavior During Therapy [facility] for tasks assessed/performed      Past Medical History:  Diagnosis Date  . Anginal pain (HCC) 12/29/2012  . Chronic low back pain   . CKD (chronic kidney disease) stage 3, GFR 30-59 ml/min   . Diabetes mellitus type 2, noninsulin dependent (HCC)   . Exertional shortness of breath   . GERD (gastroesophageal reflux disease)   . H/O hiatal hernia   . Headache(784.0)    "not daily, not weekly, but very often" (12/30/2012)  . HTN (hypertension)   . Hyperlipidemia   . Morbid obesity (HCC)   . Osteoarthritis    "hands" (12/30/2012)    Past Surgical History:  Procedure Laterality Date  . ELBOW ARTHROPLASTY Left 1990's   "3 spurs; ?14 staples" (12/30/2012)  . LEFT HEART CATHETERIZATION WITH CORONARY ANGIOGRAM N/A 12/31/2012   Procedure: LEFT HEART CATHETERIZATION WITH CORONARY ANGIOGRAM;  Surgeon: Tonny Bollman, MD;  Location: [facility] CATH LAB;  Service: Cardiovascular;  Laterality: N/A;  . LUMBAR DISC SURGERY  2010; 2011   "2 ruptured discs; 2 pinched nerves"    There were no vitals filed for this visit.      Subjective Assessment - 04/04/17 0735    Subjective Reports that his shoulder was very sore following previous treatment.   Pertinent History Sling until 03/05/17 per pt report from Dr. Case   How long can you sit comfortably? unlimied   How long can you  stand comfortably? unlimited   How long can you walk comfortably? unlimited   Currently in Pain? Yes   Pain Score 5    Pain Location Shoulder   Pain Orientation Right   Pain Descriptors / Indicators Sore   Pain Type Surgical pain   Pain Onset More than a month ago            [facility] PT Assessment - 04/04/17 0001      Assessment   Medical Diagnosis Right Shoulder Arthroscopy 02/07/17   Onset Date/Surgical Date 02/07/17   Hand Dominance Right   Next MD Visit 04/24/2017   Prior Therapy yes, for back      Precautions   Precautions Shoulder   Type of Shoulder Precautions No lifting greater than 5# over next two weeks                     [facility] Adult PT Treatment/Exercise - 04/04/17 0001      Shoulder Exercises: Supine   Internal Rotation AROM;Right;20 reps   ABduction AROM;Right;20 reps   ABduction Limitations with elbow flexion to reduce Bicep activation     Shoulder Exercises: Seated   Retraction AROM;Both;20 reps     Shoulder Exercises: Sidelying   Flexion AROM;Right;20 reps     Shoulder Exercises: Pulleys   Flexion Other (comment)  x5 min   Other Pulley Exercises Seated RUE ranger  into flex, CW and CCW circles x20 reps each     Modalities   Modalities Proofreaderlectrical Stimulation;Vasopneumatic     Electrical Stimulation   Electrical Stimulation Location R shoulder   Electrical Stimulation Action IFC   Electrical Stimulation Parameters 1-10 hz x15 min   Electrical Stimulation Goals Pain     Vasopneumatic   Number Minutes Vasopneumatic  15 minutes   Vasopnuematic Location  Shoulder   Vasopneumatic Pressure Low   Vasopneumatic Temperature  34     Manual Therapy   Manual Therapy Passive ROM   Passive ROM PROM of R shoulder into flex/IR with holds at end range                  PT Short Term Goals - 03/17/17 0806      PT SHORT TERM GOAL #1   Title Pt will be independent in his HEP.   Time 3   Period Weeks   Status On-going            PT Long Term Goals - 03/17/17 16100806      PT LONG TERM GOAL #1   Title Patient will be independent in his HEP and progression.    Time 8   Period Weeks   Status On-going     PT LONG TERM GOAL #2   Title Patient will improve his FOTO score from 52% limiitation to >/= 34% limitation   Time 8   Period Weeks   Status On-going     PT LONG TERM GOAL #3   Title Patient will be able to perform Right shoulder flexion to >/= 140 degrees with pain less than 3/10.    Time 8   Period Weeks   Status On-going     PT LONG TERM GOAL #4   Title Patient will be able to perform ADL's with no pain.   Time 8   Period Weeks   Status On-going     PT LONG TERM GOAL #5   Title Patient will increase his R shoulder flexion and abduction strength to >/= 4/5 in order to improve functional mobility.   Time 8   Period Weeks   Status On-going               Plan - 04/04/17 0809    Clinical Impression Statement Patient tolerated today's treatment fairly well today as he arrived with soreness from previous treatment. Patient able to complete exercises in various positions without complaint from patient. Intermittant  R shoulder popping encountered during PROM of R shoulder especially into flexion but not painful per patient report. Firm end feels noted at end ranges in both flexion and IR. Normal modalities response noted following removal of the modalities.    Rehab Potential Excellent   Clinical Impairments Affecting Rehab Potential surgery 02/07/17 and current 03/14/17 5 weeks  FOTO 55% limitation 10th visit   PT Frequency 2x / week   PT Duration 8 weeks   PT Treatment/Interventions ADLs/Self Care Home Management;Cryotherapy;Electrical Stimulation;Ultrasound;Neuromuscular re-education;Therapeutic exercise;Therapeutic activities;Functional mobility training;Patient/family education;Manual techniques;Passive range of motion;Dry needling;Taping;Vasopneumatic Device   PT Next Visit Plan Continue with AROM  exercises per protocol.   PT Home Exercise Plan Pendulums, cane flexion and ER   Consulted and Agree with Plan of Care Patient      Patient will benefit from skilled therapeutic intervention in order to improve the following deficits and impairments:  Postural dysfunction, Decreased range of motion, Impaired UE functional use, Pain, Decreased strength, Decreased activity tolerance  Visit Diagnosis: Acute pain of right shoulder  Stiffness of right shoulder, not elsewhere classified  Localized edema  Shoulder weakness     Problem List Patient Active Problem List   Diagnosis Date Noted  . Guaiac positive stools 10/24/2016  . Precordial pain 12/30/2012  . HTN (hypertension)   . Diabetes mellitus type 2, noninsulin dependent (HCC)   . Hyperlipidemia   . Morbid obesity (HCC)   . CKD (chronic kidney disease) stage 3, GFR 30-59 ml/min   . Anginal pain (HCC) 12/29/2012    Evelene Croon, PTA 04/04/2017, 8:23 AM  [facility] [address] [place], [place], [address] Phone: [phone]   Fax:  [phone]  Name: MARCELLUS PULLIAM MRN: [id number] Date of Birth: 1961-08-04

## 2017-04-08 ENCOUNTER — Encounter: Payer: Self-pay | Admitting: Physical Therapy

## 2017-04-08 ENCOUNTER — Ambulatory Visit: Payer: Medicare Other | Admitting: Physical Therapy

## 2017-04-08 DIAGNOSIS — M25611 Stiffness of right shoulder, not elsewhere classified: Secondary | ICD-10-CM

## 2017-04-08 DIAGNOSIS — R600 Localized edema: Secondary | ICD-10-CM

## 2017-04-08 DIAGNOSIS — R29898 Other symptoms and signs involving the musculoskeletal system: Secondary | ICD-10-CM

## 2017-04-08 DIAGNOSIS — M25511 Pain in right shoulder: Secondary | ICD-10-CM

## 2017-04-08 NOTE — Patient Instructions (Addendum)
ROM: Flexion - Wand    Lay on your back with PVC pipe or cane in hands, bring your arms overhead as much as comfortable. Hold __5__ seconds. Repeat __10__ times per set. Do __2__ sets per session. Do __2-3__ sessions per day.  http://orth.exer.us/744   Copyright  VHI. All rights reserved.

## 2017-04-08 NOTE — Therapy (Signed)
[facility] [address] [place], [place], [address] Phone: [phone]   Fax:  [phone]  Physical Therapy Treatment  Patient Details  Name: [name] MRN: [id number] Date of Birth: 02-12-1961 Referring Provider: Dr. Viviann SpareSteven Case  Encounter Date: 04/08/2017      PT End of Session - 04/08/17 0732    Visit Number 15   Number of Visits 16   Date for PT Re-Evaluation 04/21/17   Authorization Type UHC/Medicare   PT Start Time 0731   PT Stop Time 0816   PT Time Calculation (min) 45 min   Activity Tolerance Patient tolerated treatment well   Behavior During Therapy [facility] for tasks assessed/performed      Past Medical History:  Diagnosis Date  . Anginal pain (HCC) 12/29/2012  . Chronic low back pain   . CKD (chronic kidney disease) stage 3, GFR 30-59 ml/min   . Diabetes mellitus type 2, noninsulin dependent (HCC)   . Exertional shortness of breath   . GERD (gastroesophageal reflux disease)   . H/O hiatal hernia   . Headache(784.0)    "not daily, not weekly, but very often" (12/30/2012)  . HTN (hypertension)   . Hyperlipidemia   . Morbid obesity (HCC)   . Osteoarthritis    "hands" (12/30/2012)    Past Surgical History:  Procedure Laterality Date  . ELBOW ARTHROPLASTY Left 1990's   "3 spurs; ?14 staples" (12/30/2012)  . LEFT HEART CATHETERIZATION WITH CORONARY ANGIOGRAM N/A 12/31/2012   Procedure: LEFT HEART CATHETERIZATION WITH CORONARY ANGIOGRAM;  Surgeon: Tonny BollmanMichael Cooper, MD;  [facility];  Service: Cardiovascular;  Laterality: N/A;  . LUMBAR DISC SURGERY  2010; 2011   "2 ruptured discs; 2 pinched nerves"    There were no vitals filed for this visit.      Subjective Assessment - 04/08/17 0731    Subjective Reports that his shoulder is "alright" this morning.   Pertinent History Sling until 03/05/17 per pt report from Dr. Case   How long can you sit comfortably? unlimied   How long can you stand  comfortably? unlimited   How long can you walk comfortably? unlimited   Currently in Pain? Yes   Pain Score 4    Pain Location Shoulder   Pain Orientation Right   Pain Descriptors / Indicators Discomfort   Pain Type Surgical pain   Pain Onset More than a month ago   Pain Frequency Intermittent            [facility] PT Assessment - 04/08/17 0001      Assessment   Medical Diagnosis Right Shoulder Arthroscopy 02/07/17   Onset Date/Surgical Date 02/07/17   Hand Dominance Right   Next MD Visit 04/24/2017   Prior Therapy yes, for back      Precautions   Precautions Shoulder   Type of Shoulder Precautions No lifting greater than 5# over next two weeks                     [facility] Treatment/Exercise - 04/08/17 0001      Shoulder Exercises: Supine   Protraction AAROM;Both;20 reps   External Rotation AAROM;Right;20 reps   Flexion AAROM;Both;20 reps   ABduction AROM;Right;20 reps   ABduction Limitations with elbow flexion to reduce Bicep activation     Shoulder Exercises: Sidelying   Flexion AROM;Right;20 reps     Shoulder Exercises: Pulleys   Flexion Other (comment)  x3 min   Other Pulley Exercises Seated RUE  ranger into flex, CW and CCW circles x20 reps each     Modalities   Modalities Proofreader Location R shoulder   Electrical Stimulation Action IFC   Electrical Stimulation Parameters 1-10 hz x15 min   Electrical Stimulation Goals Pain     Vasopneumatic   Number Minutes Vasopneumatic  15 minutes   Vasopnuematic Location  Shoulder   Vasopneumatic Pressure Low   Vasopneumatic Temperature  34     Manual Therapy   Manual Therapy Passive ROM   Passive ROM PROM of R shoulder into flex/ER to promote increased ROM                PT Education - 04/08/17 0804    Education provided Yes   Education Details HEP- AAROM flexion   Person(s) Educated Patient   Methods  Explanation;Verbal cues;Handout   Comprehension Verbalized understanding;Verbal cues required          PT Short Term Goals - 04/08/17 0805      PT SHORT TERM GOAL #1   Title Pt will be independent in his HEP.   Time 3   Period Weeks   Status Achieved           PT Long Term Goals - 03/17/17 1610      PT LONG TERM GOAL #1   Title Patient will be independent in his HEP and progression.    Time 8   Period Weeks   Status On-going     PT LONG TERM GOAL #2   Title Patient will improve his FOTO score from 52% limiitation to >/= 34% limitation   Time 8   Period Weeks   Status On-going     PT LONG TERM GOAL #3   Title Patient will be able to perform Right shoulder flexion to >/= 140 degrees with pain less than 3/10.    Time 8   Period Weeks   Status On-going     PT LONG TERM GOAL #4   Title Patient will be able to perform ADL's with no pain.   Time 8   Period Weeks   Status On-going     PT LONG TERM GOAL #5   Title Patient will increase his R shoulder flexion and abduction strength to >/= 4/5 in order to improve functional mobility.   Time 8   Period Weeks   Status On-going               Plan - 04/08/17 0805    Clinical Impression Statement Patient tolerated today's treatment fairly well as he continues to have some discomfort with continuance of new AAROM and AROM exercises. Patient guided through various exercises with only complaint of soreness. PROM of R shoulder gently progressed to allow for increased ROM with patient experiencing soreness especially with ER. FIrm end feels noted with PROM of R shoulder into both flexion and ER. Normal modalities response noted following removal of the modalities.   Rehab Potential Excellent   Clinical Impairments Affecting Rehab Potential surgery 02/07/17 and current 03/14/17 5 weeks  FOTO 55% limitation 10th visit   PT Frequency 2x / week   PT Duration 8 weeks   PT Treatment/Interventions ADLs/Self Care Home  Management;Cryotherapy;Electrical Stimulation;Ultrasound;Neuromuscular re-education;Therapeutic exercise;Therapeutic activities;Functional mobility training;Patient/family education;Manual techniques;Passive range of motion;Dry needling;Taping;Vasopneumatic Device   PT Next Visit Plan Continue with AROM exercises per protocol.   PT Home Exercise Plan Pendulums, cane flexion and ER   Consulted  and Agree with Plan of Care Patient      Patient will benefit from skilled therapeutic intervention in order to improve the following deficits and impairments:  Postural dysfunction, Decreased range of motion, Impaired UE functional use, Pain, Decreased strength, Decreased activity tolerance  Visit Diagnosis: Acute pain of right shoulder  Stiffness of right shoulder, not elsewhere classified  Localized edema  Shoulder weakness     Problem List Patient Active Problem List   Diagnosis Date Noted  . Guaiac positive stools 10/24/2016  . Precordial pain 12/30/2012  . HTN (hypertension)   . Diabetes mellitus type 2, noninsulin dependent (HCC)   . Hyperlipidemia   . Morbid obesity (HCC)   . CKD (chronic kidney disease) stage 3, GFR 30-59 ml/min   . Anginal pain (HCC) 12/29/2012    Evelene Croon, PTA 04/08/2017, 8:21 AM  [facility] [address] [place], [place], [address] Phone: [phone]   Fax:  [phone]  Name: [name] MRN: [id number] Date of Birth: 1961/01/10

## 2017-04-11 ENCOUNTER — Encounter: Payer: Self-pay | Admitting: Physical Therapy

## 2017-04-11 ENCOUNTER — Ambulatory Visit: Payer: Medicare Other | Admitting: Physical Therapy

## 2017-04-11 DIAGNOSIS — R29898 Other symptoms and signs involving the musculoskeletal system: Secondary | ICD-10-CM

## 2017-04-11 DIAGNOSIS — M25511 Pain in right shoulder: Secondary | ICD-10-CM | POA: Diagnosis not present

## 2017-04-11 DIAGNOSIS — M25611 Stiffness of right shoulder, not elsewhere classified: Secondary | ICD-10-CM

## 2017-04-11 DIAGNOSIS — R600 Localized edema: Secondary | ICD-10-CM

## 2017-04-11 NOTE — Therapy (Signed)
[facility] [address] [place], [place], [address] Phone: [phone]   Fax:  [phone]  Physical Therapy Treatment  Patient Details  Name: [name] MRN: [id number] Date of Birth: 12-10-1960 Referring Provider: Dr. Viviann Spare Case  Encounter Date: 04/11/2017      PT End of Session - 04/11/17 0734    Visit Number 16   Number of Visits 16   Date for PT Re-Evaluation 04/21/17   Authorization Type UHC/Medicare   PT Start Time 0732   PT Stop Time 0817   PT Time Calculation (min) 45 min   Activity Tolerance Patient tolerated treatment well   Behavior During Therapy [facility] for tasks assessed/performed      Past Medical History:  Diagnosis Date  . Anginal pain (HCC) 12/29/2012  . Chronic low back pain   . CKD (chronic kidney disease) stage 3, GFR 30-59 ml/min   . Diabetes mellitus type 2, noninsulin dependent (HCC)   . Exertional shortness of breath   . GERD (gastroesophageal reflux disease)   . H/O hiatal hernia   . Headache(784.0)    "not daily, not weekly, but very often" (12/30/2012)  . HTN (hypertension)   . Hyperlipidemia   . Morbid obesity (HCC)   . Osteoarthritis    "hands" (12/30/2012)    Past Surgical History:  Procedure Laterality Date  . ELBOW ARTHROPLASTY Left 1990's   "3 spurs; ?14 staples" (12/30/2012)  . LEFT HEART CATHETERIZATION WITH CORONARY ANGIOGRAM N/A 12/31/2012   Procedure: LEFT HEART CATHETERIZATION WITH CORONARY ANGIOGRAM;  Surgeon: Tonny Bollman, MD;  Location: [facility] CATH LAB;  Service: Cardiovascular;  Laterality: N/A;  . LUMBAR DISC SURGERY  2010; 2011   "2 ruptured discs; 2 pinched nerves"    There were no vitals filed for this visit.      Subjective Assessment - 04/11/17 0734    Subjective Reports that his shoulder is sore/stiff this morning.   Pertinent History Sling until 03/05/17 per pt report from Dr. Case   How long can you sit comfortably? unlimied   How long can you stand  comfortably? unlimited   How long can you walk comfortably? unlimited   Currently in Pain? Yes   Pain Score 6    Pain Location Shoulder   Pain Orientation Right   Pain Descriptors / Indicators Sore;Other (Comment)  Stiffness   Pain Type Surgical pain   Pain Onset More than a month ago            [facility] PT Assessment - 04/11/17 0001      Assessment   Medical Diagnosis Right Shoulder Arthroscopy 02/07/17   Onset Date/Surgical Date 02/07/17   Hand Dominance Right   Next MD Visit 04/24/2017   Prior Therapy yes, for back      Precautions   Precautions Shoulder   Type of Shoulder Precautions No lifting greater than 5# over next two weeks     ROM / Strength   AROM / PROM / Strength AROM     AROM   Overall AROM  Deficits   AROM Assessment Site Shoulder   Right/Left Shoulder Right   Right Shoulder Flexion 135 Degrees   Right Shoulder Internal Rotation 55 Degrees   Right Shoulder External Rotation 40 Degrees                     [facility] Adult PT Treatment/Exercise - 04/11/17 0001      Shoulder Exercises: Supine   Protraction AAROM;Both;20 reps   External  Rotation AAROM;Right;20 reps   Flexion AAROM;Both;20 reps   ABduction AROM;Right;20 reps   ABduction Limitations with elbow flexion to reduce Bicep activation     Shoulder Exercises: Seated   Retraction AROM;Both;20 reps     Shoulder Exercises: Sidelying   External Rotation AROM;Right;20 reps   Flexion AROM;Right;20 reps     Shoulder Exercises: Pulleys   Flexion Other (comment)  x5 min   Other Pulley Exercises Seated RUE ranger into flex, CW and CCW circles x20 reps each     Shoulder Exercises: ROM/Strengthening   Rhythmic Stabilization, Supine R shoulder rhythmic stabs in flexion and ER to promote stability of glenohumeral joint     Modalities   Modalities Electrical Stimulation;Vasopneumatic     Electrical Stimulation   Electrical Stimulation Location R shoulder   Electrical Stimulation Action IFC    Electrical Stimulation Parameters 1-10 hz x15 min   Electrical Stimulation Goals Pain     Vasopneumatic   Number Minutes Vasopneumatic  15 minutes   Vasopnuematic Location  Shoulder   Vasopneumatic Pressure Low   Vasopneumatic Temperature  66                  PT Short Term Goals - 04/08/17 0805      PT SHORT TERM GOAL #1   Title Pt will be independent in his HEP.   Time 3   Period Weeks   Status Achieved           PT Long Term Goals - 03/17/17 1610      PT LONG TERM GOAL #1   Title Patient will be independent in his HEP and progression.    Time 8   Period Weeks   Status On-going     PT LONG TERM GOAL #2   Title Patient will improve his FOTO score from 52% limiitation to >/= 34% limitation   Time 8   Period Weeks   Status On-going     PT LONG TERM GOAL #3   Title Patient will be able to perform Right shoulder flexion to >/= 140 degrees with pain less than 3/10.    Time 8   Period Weeks   Status On-going     PT LONG TERM GOAL #4   Title Patient will be able to perform ADL's with no pain.   Time 8   Period Weeks   Status On-going     PT LONG TERM GOAL #5   Title Patient will increase his R shoulder flexion and abduction strength to >/= 4/5 in order to improve functional mobility.   Time 8   Period Weeks   Status On-going               Plan - 04/11/17 0806    Clinical Impression Statement Patient tolerated today's treatment well although soreness and stiffness continues in R shoulder per patient report. Patient guided through AROM/AAROM exercises with no complaint during the exercises but reports of soreness following exercise session. Rhythmic stabilizations completed in flexion and ER with fairly good stability in both planes. AROM measurements conducted today with improvement noted although tightness with ER. Normal modalities response noted following removal of the modalities.   Rehab Potential Excellent   Clinical Impairments Affecting  Rehab Potential surgery 02/07/17 and current 03/14/17 5 weeks  FOTO 55% limitation 10th visit   PT Frequency 2x / week   PT Duration 8 weeks   PT Treatment/Interventions ADLs/Self Care Home Management;Cryotherapy;Electrical Stimulation;Ultrasound;Neuromuscular re-education;Therapeutic exercise;Therapeutic activities;Functional mobility training;Patient/family education;Manual techniques;Passive range  of motion;Dry needling;Taping;Vasopneumatic Device   PT Next Visit Plan Continue with AROM exercises per protocol.   PT Home Exercise Plan Pendulums, cane flexion and ER   Consulted and Agree with Plan of Care Patient      Patient will benefit from skilled therapeutic intervention in order to improve the following deficits and impairments:  Postural dysfunction, Decreased range of motion, Impaired UE functional use, Pain, Decreased strength, Decreased activity tolerance  Visit Diagnosis: Acute pain of right shoulder  Stiffness of right shoulder, not elsewhere classified  Localized edema  Shoulder weakness     Problem List Patient Active Problem List   Diagnosis Date Noted  . Guaiac positive stools 10/24/2016  . Precordial pain 12/30/2012  . HTN (hypertension)   . Diabetes mellitus type 2, noninsulin dependent (HCC)   . Hyperlipidemia   . Morbid obesity (HCC)   . CKD (chronic kidney disease) stage 3, GFR 30-59 ml/min   . Anginal pain (HCC) 12/29/2012    Florence CannerKelsey Parsons, PTA 04/11/17 8:48 AM  [facility] [address] [place], [place], [address] Phone: [phone]   Fax:  [phone]  Name: Ezzie Duralhomas W Pae MRN: [id number] Date of Birth: Aug 02, 1961

## 2017-04-15 ENCOUNTER — Ambulatory Visit: Payer: Medicare Other | Admitting: Physical Therapy

## 2017-04-17 ENCOUNTER — Encounter: Payer: Medicare Other | Admitting: Physical Therapy

## 2017-04-22 ENCOUNTER — Encounter: Payer: Self-pay | Admitting: Physical Therapy

## 2017-04-22 ENCOUNTER — Ambulatory Visit: Payer: Medicare Other | Attending: Orthopedic Surgery | Admitting: Physical Therapy

## 2017-04-22 DIAGNOSIS — M25511 Pain in right shoulder: Secondary | ICD-10-CM | POA: Diagnosis present

## 2017-04-22 DIAGNOSIS — M25611 Stiffness of right shoulder, not elsewhere classified: Secondary | ICD-10-CM | POA: Insufficient documentation

## 2017-04-22 DIAGNOSIS — R29898 Other symptoms and signs involving the musculoskeletal system: Secondary | ICD-10-CM | POA: Diagnosis present

## 2017-04-22 DIAGNOSIS — R600 Localized edema: Secondary | ICD-10-CM

## 2017-04-22 NOTE — Therapy (Signed)
[facility] [address] [place], [place], [address] Phone: [phone]   Fax:  [phone]  Physical Therapy Treatment  Patient Details  Name: [name] MRN: [id number] Date of Birth: [birth year] Referring Provider: Dr. Viviann Spare Case  Encounter Date: 04/22/2017      PT End of Session - 04/22/17 0738    Visit Number 17   Number of Visits 16   Date for PT Re-Evaluation 04/21/17   Authorization Type UHC/Medicare   PT Start Time 0731   PT Stop Time 0818   PT Time Calculation (min) 47 min   Activity Tolerance Patient tolerated treatment well   Behavior During Therapy [facility] for tasks assessed/performed      Past Medical History:  Diagnosis Date  . Anginal pain (HCC) 12/29/2012  . Chronic low back pain   . CKD (chronic kidney disease) stage 3, GFR 30-59 ml/min   . Diabetes mellitus type 2, noninsulin dependent (HCC)   . Exertional shortness of breath   . GERD (gastroesophageal reflux disease)   . H/O hiatal hernia   . Headache(784.0)    "not daily, not weekly, but very often" (12/30/2012)  . HTN (hypertension)   . Hyperlipidemia   . Morbid obesity (HCC)   . Osteoarthritis    "hands" (12/30/2012)    Past Surgical History:  Procedure Laterality Date  . ELBOW ARTHROPLASTY Left 1990's   "3 spurs; ?14 staples" (12/30/2012)  . LEFT HEART CATHETERIZATION WITH CORONARY ANGIOGRAM N/A 12/31/2012   Procedure: LEFT HEART CATHETERIZATION WITH CORONARY ANGIOGRAM;  Surgeon: Tonny Bollman, MD;  Location: [facility] CATH LAB;  Service: Cardiovascular;  Laterality: N/A;  . LUMBAR DISC SURGERY  2010; 2011   "2 ruptured discs; 2 pinched nerves"    There were no vitals filed for this visit.      Subjective Assessment - 04/22/17 0737    Subjective Reports that his shoulder is "alright" today.   Pertinent History Sling until 03/05/17 per pt report from Dr. Case   How long can you sit comfortably? unlimied   How long can you stand comfortably?  unlimited   How long can you walk comfortably? unlimited   Currently in Pain? Yes   Pain Score 2    Pain Location Shoulder   Pain Orientation Right   Pain Descriptors / Indicators Sore   Pain Type Surgical pain   Pain Onset More than a month ago            [facility] PT Assessment - 04/22/17 0001      Assessment   Medical Diagnosis Right Shoulder Arthroscopy 02/07/17   Onset Date/Surgical Date 02/07/17   Hand Dominance Right   Next MD Visit 04/24/2017   Prior Therapy yes, for back      Precautions   Precautions Shoulder   Type of Shoulder Precautions No lifting greater than 5# over next two weeks                     [facility] Adult PT Treatment/Exercise - 04/22/17 0001      Shoulder Exercises: Supine   Protraction AROM;Right;Other (comment)   Protraction Limitations 3x10 reps   Flexion AROM;Right;Other (comment)   Flexion Limitations 3X10 REPS   ABduction AROM;Right;Other (comment)   ABduction Limitations with elbow flexion to reduce Bicep activation; 3x10 reps  8/10 discomfort reported by patient   Other Supine Exercises AROM R shoulder D1 and D2 x20 reps  greater discomfort with D2 exercise     Shoulder Exercises: Sidelying  External Rotation AROM;Right;20 reps   Flexion AROM;Right;20 reps     Shoulder Exercises: Pulleys   Flexion Other (comment)  x5 min   Other Pulley Exercises Standing RUE ranger into flex, CW and CCW circles x20 reps     Modalities   Modalities Electrical Stimulation;Vasopneumatic     Electrical Stimulation   Electrical Stimulation Location R shoulder   Electrical Stimulation Action IFC   Electrical Stimulation Parameters 1-10 hz x15 min   Electrical Stimulation Goals Pain     Vasopneumatic   Number Minutes Vasopneumatic  15 minutes   Vasopnuematic Location  Shoulder   Vasopneumatic Pressure Low   Vasopneumatic Temperature  67     Manual Therapy   Manual Therapy Passive ROM   Passive ROM PROM of R shoulder into flex/ER/IR to  promote increased ROM                  PT Short Term Goals - 04/08/17 0805      PT SHORT TERM GOAL #1   Title Pt will be independent in his HEP.   Time 3   Period Weeks   Status Achieved           PT Long Term Goals - 03/17/17 1610      PT LONG TERM GOAL #1   Title Patient will be independent in his HEP and progression.    Time 8   Period Weeks   Status On-going     PT LONG TERM GOAL #2   Title Patient will improve his FOTO score from 52% limiitation to >/= 34% limitation   Time 8   Period Weeks   Status On-going     PT LONG TERM GOAL #3   Title Patient will be able to perform Right shoulder flexion to >/= 140 degrees with pain less than 3/10.    Time 8   Period Weeks   Status On-going     PT LONG TERM GOAL #4   Title Patient will be able to perform ADL's with no pain.   Time 8   Period Weeks   Status On-going     PT LONG TERM GOAL #5   Title Patient will increase his R shoulder flexion and abduction strength to >/= 4/5 in order to improve functional mobility.   Time 8   Period Weeks   Status On-going               Plan - 04/22/17 0808    Clinical Impression Statement Patient tolerated today's treatment well although he did report increased pain with AROM R shoulder D2 and short lever shoulder abduction in supine. Patient able to complete exercises correctly with minimal verbal and demo cues. Patient encouraged to ask MD in regards to  R shoulder popping that is not painful per patient report. Normal modalities response noted following removal of the modalities.   Rehab Potential Excellent   Clinical Impairments Affecting Rehab Potential surgery 02/07/17 and current 03/14/17 5 weeks  FOTO 55% limitation 10th visit   PT Frequency 2x / week   PT Duration 8 weeks   PT Treatment/Interventions ADLs/Self Care Home Management;Cryotherapy;Electrical Stimulation;Ultrasound;Neuromuscular re-education;Therapeutic exercise;Therapeutic activities;Functional  mobility training;Patient/family education;Manual techniques;Passive range of motion;Dry needling;Taping;Vasopneumatic Device   PT Next Visit Plan Continue with AROM exercises per protocol.   PT Home Exercise Plan Pendulums, cane flexion and ER   Consulted and Agree with Plan of Care Patient      Patient will benefit from skilled therapeutic intervention in order  to improve the following deficits and impairments:  Postural dysfunction, Decreased range of motion, Impaired UE functional use, Pain, Decreased strength, Decreased activity tolerance  Visit Diagnosis: Acute pain of right shoulder  Stiffness of right shoulder, not elsewhere classified  Localized edema  Shoulder weakness     Problem List Patient Active Problem List   Diagnosis Date Noted  . Guaiac positive stools 10/24/2016  . Precordial pain 12/30/2012  . HTN (hypertension)   . Diabetes mellitus type 2, noninsulin dependent (HCC)   . Hyperlipidemia   . Morbid obesity (HCC)   . CKD (chronic kidney disease) stage 3, GFR 30-59 ml/min   . Anginal pain (HCC) 12/29/2012    Evelene CroonKelsey M Parsons, PTA 04/22/2017, 8:24 AM  [facility] [address] [place], [place], [address] Phone: [phone]   Fax:  [phone]  Name: [name] MRN: [id number] Date of Birth: [birth year]

## 2017-04-24 ENCOUNTER — Encounter: Payer: Self-pay | Admitting: Physical Therapy

## 2017-04-24 ENCOUNTER — Ambulatory Visit: Payer: Medicare Other | Admitting: Physical Therapy

## 2017-04-24 DIAGNOSIS — M25511 Pain in right shoulder: Secondary | ICD-10-CM | POA: Diagnosis not present

## 2017-04-24 DIAGNOSIS — M25611 Stiffness of right shoulder, not elsewhere classified: Secondary | ICD-10-CM

## 2017-04-24 DIAGNOSIS — R600 Localized edema: Secondary | ICD-10-CM

## 2017-04-24 DIAGNOSIS — R29898 Other symptoms and signs involving the musculoskeletal system: Secondary | ICD-10-CM

## 2017-04-24 NOTE — Therapy (Signed)
[facility] [address] [place], [place], [address] Phone: [phone]   Fax:  [phone]  Physical Therapy Treatment  Patient Details  Name: [name] MRN: [id number] Date of Birth: 1961-01-19 Referring Provider: Dr. Viviann Spare Case  Encounter Date: 04/24/2017      PT End of Session - 04/24/17 0754    Visit Number 18   Date for PT Re-Evaluation 04/21/17   Authorization Type UHC/Medicare   PT Start Time 0729   PT Stop Time 0817   PT Time Calculation (min) 48 min   Activity Tolerance Patient tolerated treatment well   Behavior During Therapy [facility] for tasks assessed/performed      Past Medical History:  Diagnosis Date  . Anginal pain (HCC) 12/29/2012  . Chronic low back pain   . CKD (chronic kidney disease) stage 3, GFR 30-59 ml/min   . Diabetes mellitus type 2, noninsulin dependent (HCC)   . Exertional shortness of breath   . GERD (gastroesophageal reflux disease)   . H/O hiatal hernia   . Headache(784.0)    "not daily, not weekly, but very often" (12/30/2012)  . HTN (hypertension)   . Hyperlipidemia   . Morbid obesity (HCC)   . Osteoarthritis    "hands" (12/30/2012)    Past Surgical History:  Procedure Laterality Date  . ELBOW ARTHROPLASTY Left 1990's   "3 spurs; ?14 staples" (12/30/2012)  . LEFT HEART CATHETERIZATION WITH CORONARY ANGIOGRAM N/A 12/31/2012   Procedure: LEFT HEART CATHETERIZATION WITH CORONARY ANGIOGRAM;  Surgeon: Tonny Bollman, MD;  Location: [facility] CATH LAB;  Service: Cardiovascular;  Laterality: N/A;  . LUMBAR DISC SURGERY  2010; 2011   "2 ruptured discs; 2 pinched nerves"    There were no vitals filed for this visit.      Subjective Assessment - 04/24/17 0734    Subjective Patient reported increased soreness after last treatment from new exercises   How long can you sit comfortably? unlimied   How long can you stand comfortably? unlimited   How long can you walk comfortably? unlimited   Currently in Pain? Yes   Pain Score 6    Pain Location Shoulder   Pain Orientation Right   Pain Descriptors / Indicators Sore   Pain Type Surgical pain   Pain Onset More than a month ago   Pain Frequency Intermittent   Aggravating Factors  any prolong use of shoulder   Pain Relieving Factors at rest            [facility] PT Assessment - 04/24/17 0001      ROM / Strength   AROM / PROM / Strength AROM;PROM     AROM   AROM Assessment Site Shoulder   Right/Left Shoulder Right   Right Shoulder Flexion 115 Degrees   Right Shoulder External Rotation 50 Degrees     PROM   PROM Assessment Site Shoulder   Right/Left Shoulder Right   Right Shoulder Flexion 150 Degrees   Right Shoulder External Rotation 60 Degrees                     [facility] Adult PT Treatment/Exercise - 04/24/17 0001      Shoulder Exercises: Supine   Protraction AROM;Right;Other (comment)   Protraction Limitations 3x10 reps   Flexion AROM;Right;Other (comment)   Flexion Limitations 3X10 REPS   Other Supine Exercises cane for ER 2x10     Shoulder Exercises: Seated   Retraction AROM;Both;20 reps     Shoulder Exercises: Sidelying  External Rotation Strengthening;Right;AROM   External Rotation Limitations 3x10   Flexion AROM;Right;20 reps     Shoulder Exercises: Pulleys   Flexion Other (comment)  5min   Other Pulley Exercises Standing RUE ranger into flex, CW and CCW circles x20 reps     Electrical Stimulation   Electrical Stimulation Location R shoulder   Electrical Stimulation Action IFC   Electrical Stimulation Parameters 1-10hz  x7115min   Electrical Stimulation Goals Pain     Vasopneumatic   Number Minutes Vasopneumatic  15 minutes   Vasopnuematic Location  Shoulder   Vasopneumatic Pressure Low     Manual Therapy   Manual Therapy Passive ROM   Passive ROM PROM of R shoulder into flex/ER/IR to promote increased ROM                  PT Short Term Goals - 04/08/17 0805       PT SHORT TERM GOAL #1   Title Pt will be independent in his HEP.   Time 3   Period Weeks   Status Achieved           PT Long Term Goals - 03/17/17 16100806      PT LONG TERM GOAL #1   Title Patient will be independent in his HEP and progression.    Time 8   Period Weeks   Status On-going     PT LONG TERM GOAL #2   Title Patient will improve his FOTO score from 52% limiitation to >/= 34% limitation   Time 8   Period Weeks   Status On-going     PT LONG TERM GOAL #3   Title Patient will be able to perform Right shoulder flexion to >/= 140 degrees with pain less than 3/10.    Time 8   Period Weeks   Status On-going     PT LONG TERM GOAL #4   Title Patient will be able to perform ADL's with no pain.   Time 8   Period Weeks   Status On-going     PT LONG TERM GOAL #5   Title Patient will increase his R shoulder flexion and abduction strength to >/= 4/5 in order to improve functional mobility.   Time 8   Period Weeks   Status On-going               Plan - 04/24/17 0800    Clinical Impression Statement Patient tolerated treatment well today. Patient reported soreness from new exercises yet able to perform activities with no increased pain. Patient has improved ROM overall and has full AROM for right shoulder flexion in supine today. Patient goals ongoing due to strength deficts and limitations with antigravity movements.    Rehab Potential Excellent   Clinical Impairments Affecting Rehab Potential surgery 02/07/17 and current 11 weeks 04/25/17   PT Frequency 2x / week   PT Duration 8 weeks   PT Treatment/Interventions ADLs/Self Care Home Management;Cryotherapy;Electrical Stimulation;Ultrasound;Neuromuscular re-education;Therapeutic exercise;Therapeutic activities;Functional mobility training;Patient/family education;Manual techniques;Passive range of motion;Dry needling;Taping;Vasopneumatic Device   PT Next Visit Plan Continue with AROM exercises per protocol. (MD. Case  04/24/17)   Consulted and Agree with Plan of Care Patient      Patient will benefit from skilled therapeutic intervention in order to improve the following deficits and impairments:  Postural dysfunction, Decreased range of motion, Impaired UE functional use, Pain, Decreased strength, Decreased activity tolerance  Visit Diagnosis: Acute pain of right shoulder  Stiffness of right shoulder, not elsewhere classified  Localized  edema  Shoulder weakness     Problem List Patient Active Problem List   Diagnosis Date Noted  . Guaiac positive stools 10/24/2016  . Precordial pain 12/30/2012  . HTN (hypertension)   . Diabetes mellitus type 2, noninsulin dependent (HCC)   . Hyperlipidemia   . Morbid obesity (HCC)   . CKD (chronic kidney disease) stage 3, GFR 30-59 ml/min   . Anginal pain [facility]) 12/29/2012    Cathie Hoops, PTA 04/24/17 10:34 AM [place] MPT [facility] [address] [place], [place], [address] Phone: [phone]   Fax:  [phone]  Name: [name] MRN: [id number] Date of Birth: 1960-10-29

## 2017-04-29 ENCOUNTER — Ambulatory Visit: Payer: Medicare Other | Admitting: *Deleted

## 2017-04-29 DIAGNOSIS — R29898 Other symptoms and signs involving the musculoskeletal system: Secondary | ICD-10-CM

## 2017-04-29 DIAGNOSIS — R600 Localized edema: Secondary | ICD-10-CM

## 2017-04-29 DIAGNOSIS — M25611 Stiffness of right shoulder, not elsewhere classified: Secondary | ICD-10-CM

## 2017-04-29 DIAGNOSIS — M25511 Pain in right shoulder: Secondary | ICD-10-CM | POA: Diagnosis not present

## 2017-04-29 NOTE — Therapy (Signed)
[facility] [address] [place], [place], [address] Phone: [phone]   Fax:  [phone]  Physical Therapy Treatment  Patient Details  Name: [name] MRN: [id number] Date of Birth: 1961-01-25 Referring Provider: Dr. Viviann SpareSteven Strickland  Encounter Date: 04/29/2017      PT End of Session - 04/29/17 2145    Visit Number 19   Number of Visits 24   Date for PT Re-Evaluation 05/22/17   Authorization Type UHC/Medicare   PT Start Time 1300   PT Stop Time 1400   PT Time Calculation (min) 60 min      Past Medical History:  Diagnosis Date  . Anginal pain (HCC) 12/29/2012  . Chronic low back pain   . CKD (chronic kidney disease) stage 3, GFR 30-59 ml/min   . Diabetes mellitus type 2, noninsulin dependent (HCC)   . Exertional shortness of breath   . GERD (gastroesophageal reflux disease)   . H/O hiatal hernia   . Headache(784.0)    "not daily, not weekly, but very often" (12/30/2012)  . HTN (hypertension)   . Hyperlipidemia   . Morbid obesity (HCC)   . Osteoarthritis    "hands" (12/30/2012)    Past Surgical History:  Procedure Laterality Date  . ELBOW ARTHROPLASTY Left 1990's   "3 spurs; ?14 staples" (12/30/2012)  . LEFT HEART CATHETERIZATION WITH CORONARY ANGIOGRAM N/A 12/31/2012   Procedure: LEFT HEART CATHETERIZATION WITH CORONARY ANGIOGRAM;  Surgeon: Tonny BollmanMichael Cooper, MD;  [facility];  Service: Cardiovascular;  Laterality: N/A;  . LUMBAR DISC SURGERY  2010; 2011   "2 ruptured discs; 2 pinched nerves"    There were no vitals filed for this visit.                       [facility] Adult PT Treatment/Exercise - 04/29/17 0001      Shoulder Exercises: Standing   Other Standing Exercises RW 4 yellow band x30 for HEP     Shoulder Exercises: Pulleys   Flexion Other (comment)  5min   Other Pulley Exercises Standing RUE ranger into flex, CW and CCW circles x20 reps     Modalities   Modalities Electrical  Stimulation;Vasopneumatic     Electrical Stimulation   Electrical Stimulation Location R shoulder  IFC 1-10hz   x 15 mins   Electrical Stimulation Goals Pain     Vasopneumatic   Number Minutes Vasopneumatic  15 minutes   Vasopnuematic Location  Shoulder   Vasopneumatic Pressure Low   Vasopneumatic Temperature  38     Manual Therapy   Manual Therapy Passive ROM   Passive ROM PROM of R shoulder into flex/ER/IR to promote increased ROM                  PT Short Term Goals - 04/08/17 0805      PT SHORT TERM GOAL #1   Title Pt will be independent in his HEP.   Time 3   Period Weeks   Status Achieved           PT Long Term Goals - 03/17/17 [id number]      PT LONG TERM GOAL #1   Title Patient will be independent in his HEP and progression.    Time 8   Period Weeks   Status On-going     PT LONG TERM GOAL #2   Title Patient will improve his FOTO score from 52% limiitation to >/= 34% limitation   Time 8  Period Weeks   Status On-going     PT LONG TERM GOAL #3   Title Patient will be able to perform Right shoulder flexion to >/= 140 degrees with pain less than 3/10.    Time 8   Period Weeks   Status On-going     PT LONG TERM GOAL #4   Title Patient will be able to perform ADL's with no pain.   Time 8   Period Weeks   Status On-going     PT LONG TERM GOAL #5   Title Patient will increase his R shoulder flexion and abduction strength to >/= 4/5 in order to improve functional mobility.   Time 8   Period Weeks   Status On-going               Plan - 04/29/17 2138    Clinical Impression Statement Pt arrived today after MD F/U last week. His N.O. is to start strengthening. RW4 was started today with yellow Tband and given for HEP. Rx focused on starting strengthening and increasing ROM   Rehab Potential Excellent   Clinical Impairments Affecting Rehab Potential surgery 02/07/17 and current 11 weeks 04/25/17   PT Frequency 2x / week   PT Duration 8 weeks    PT Treatment/Interventions ADLs/Self Care Home Management;Cryotherapy;Electrical Stimulation;Ultrasound;Neuromuscular re-education;Therapeutic exercise;Therapeutic activities;Functional mobility training;Patient/family education;Manual techniques;Passive range of motion;Dry needling;Taping;Vasopneumatic Device   PT Next Visit Plan Continue with AROM exercises per protocol. (MD. Strickland 04/24/17)  New Order from MD to start Bands. See new Order      Patient will benefit from skilled therapeutic intervention in order to improve the following deficits and impairments:  Postural dysfunction, Decreased range of motion, Impaired UE functional use, Pain, Decreased strength, Decreased activity tolerance  Visit Diagnosis: Acute pain of right shoulder  Stiffness of right shoulder, not elsewhere classified  Localized edema  Shoulder weakness     Problem List Patient Active Problem List   Diagnosis Date Noted  . Guaiac positive stools 10/24/2016  . Precordial pain 12/30/2012  . HTN (hypertension)   . Diabetes mellitus type 2, noninsulin dependent (HCC)   . Hyperlipidemia   . Morbid obesity (HCC)   . CKD (chronic kidney disease) stage 3, GFR 30-59 ml/min   . Anginal pain (HCC) 12/29/2012    [name],[name], PTA 04/29/2017, 9:50 PM  [facility] [address] [place], [place], [address] Phone: [phone]   Fax:  [phone]  Name: [name] MRN: [id number] Date of Birth: Jul 07, 1961

## 2017-05-02 ENCOUNTER — Encounter: Payer: Self-pay | Admitting: Physical Therapy

## 2017-05-02 ENCOUNTER — Ambulatory Visit: Payer: Medicare Other | Admitting: Physical Therapy

## 2017-05-02 DIAGNOSIS — M25511 Pain in right shoulder: Secondary | ICD-10-CM

## 2017-05-02 DIAGNOSIS — M25611 Stiffness of right shoulder, not elsewhere classified: Secondary | ICD-10-CM

## 2017-05-02 DIAGNOSIS — R600 Localized edema: Secondary | ICD-10-CM

## 2017-05-02 DIAGNOSIS — R29898 Other symptoms and signs involving the musculoskeletal system: Secondary | ICD-10-CM

## 2017-05-02 NOTE — Therapy (Addendum)
[facility] [address] [place], [place], [address] Phone: [phone]   Fax:  [phone]  Physical Therapy Treatment  Patient Details  Name: [name] MRN: [id number] Date of Birth: 07-Dec-1960 Referring Provider: Dr. Viviann SpareSteven Case  Encounter Date: 05/02/2017      PT End of Session - 05/02/17 0902    Visit Number 20   Number of Visits 24   Date for PT Re-Evaluation 05/22/17   Authorization Type UHC/Medicare   PT Start Time 0901   PT Stop Time 0954   PT Time Calculation (min) 53 min   Activity Tolerance Patient tolerated treatment well   Behavior During Therapy [facility] for tasks assessed/performed      Past Medical History:  Diagnosis Date  . Anginal pain (HCC) 12/29/2012  . Chronic low back pain   . CKD (chronic kidney disease) stage 3, GFR 30-59 ml/min   . Diabetes mellitus type 2, noninsulin dependent (HCC)   . Exertional shortness of breath   . GERD (gastroesophageal reflux disease)   . H/O hiatal hernia   . Headache(784.0)    "not daily, not weekly, but very often" (12/30/2012)  . HTN (hypertension)   . Hyperlipidemia   . Morbid obesity (HCC)   . Osteoarthritis    "hands" (12/30/2012)    Past Surgical History:  Procedure Laterality Date  . ELBOW ARTHROPLASTY Left 1990's   "3 spurs; ?14 staples" (12/30/2012)  . LEFT HEART CATHETERIZATION WITH CORONARY ANGIOGRAM N/A 12/31/2012   Procedure: LEFT HEART CATHETERIZATION WITH CORONARY ANGIOGRAM;  Surgeon: Tonny BollmanMichael Cooper, MD;  [facility];  Service: Cardiovascular;  Laterality: N/A;  . LUMBAR DISC SURGERY  2010; 2011   "2 ruptured discs; 2 pinched nerves"    There were no vitals filed for this visit.      Subjective Assessment - 05/02/17 0901    Subjective Reports that he has some pain last night but biofreeze lowered the pain.   Pertinent History Sling until 03/05/17 per pt report from Dr. Case   How long can you sit comfortably? unlimied   How long can  you stand comfortably? unlimited   How long can you walk comfortably? unlimited   Currently in Pain? Yes   Pain Score 2    Pain Location Shoulder   Pain Orientation Right   Pain Descriptors / Indicators Sore   Pain Type Surgical pain   Pain Onset More than a month ago            [facility] - 05/02/17 0001      Assessment   Medical Diagnosis Right Shoulder Arthroscopy 02/07/17   Onset Date/Surgical Date 02/07/17   Hand Dominance Right   Prior Therapy yes, for back      Precautions   Precautions Shoulder   Type of Shoulder Precautions No lifting greater than 5# over next two weeks                     [facility] Adult PT Treatment/Exercise - 05/02/17 0001      Shoulder Exercises: Supine   Flexion AROM;Right;20 reps     Shoulder Exercises: Prone   Retraction AROM;Right;20 reps   Extension AROM;Right;20 reps   Horizontal ABduction 1 AROM;Right;20 reps     Shoulder Exercises: Sidelying   External Rotation AROM;Right;20 reps     Shoulder Exercises: Standing   Protraction Strengthening;Right;20 reps;Theraband   Theraband Level (Shoulder Protraction) Level 1 (Yellow)   External Rotation Strengthening;Right;20 reps;Theraband   Theraband  Level (Shoulder External Rotation) Level 1 (Yellow)   Internal Rotation Strengthening;Right;20 reps;Theraband   Theraband Level (Shoulder Internal Rotation) Level 1 (Yellow)   Extension Strengthening;Right;20 reps;Theraband   Theraband Level (Shoulder Extension) Level 1 (Yellow)   Row Strengthening;Right;20 reps;Theraband   Theraband Level (Shoulder Row) Level 1 (Yellow)     Shoulder Exercises: Pulleys   Flexion Other (comment)  x5 min   Other Pulley Exercises Standing RUE ranger into flex, CW and CCW circles x20 reps   Other Pulley Exercises Wall slides x20 reps     Modalities   Modalities Electrical Stimulation;Vasopneumatic     Electrical Stimulation   Electrical Stimulation Location R shoulder   Electrical  Stimulation Action IFC   Electrical Stimulation Parameters 1-10 hz x15 min   Electrical Stimulation Goals Pain     Vasopneumatic   Number Minutes Vasopneumatic  15 minutes   Vasopnuematic Location  Shoulder   Vasopneumatic Pressure Low   Vasopneumatic Temperature  67     Manual Therapy   Manual Therapy Passive ROM   Passive ROM PROM of R shoulder into flex/ER to promote increased ROM                  PT Short Term Goals - 04/08/17 0805      PT SHORT TERM GOAL #1   Title Pt will be independent in his HEP.   Time 3   Period Weeks   Status Achieved           PT Long Term Goals - 05/02/17 4098      PT LONG TERM GOAL #1   Title Patient will be independent in his HEP and progression.    Time 8   Period Weeks   Status Achieved     PT LONG TERM GOAL #2   Title Patient will improve his FOTO score from 52% limiitation to >/= 34% limitation   Time 8   Period Weeks   Status On-going     PT LONG TERM GOAL #3   Title Patient will be able to perform Right shoulder flexion to >/= 140 degrees with pain less than 3/10.    Time 8   Period Weeks   Status On-going     PT LONG TERM GOAL #4   Title Patient will be able to perform ADL's with no pain.   Time 8   Period Weeks   Status On-going     PT LONG TERM GOAL #5   Title Patient will increase his R shoulder flexion and abduction strength to >/= 4/5 in order to improve functional mobility.   Time 8   Period Weeks   Status On-going               Plan - 05/02/17 0959    Clinical Impression Statement Patient tolerated today's treatment fairly well as he continues to arrive with R shoulder soreness. Patient guided through R shoulder AROM exercises with gentle strengthening as well with only complaints of difficulty with ER with resistance and in supine. Firm end feels and smooth arc of motion observed with PROM of R shoulder. Normal modalities response noted following removal of the modalities.   Rehab  Potential Excellent   Clinical Impairments Affecting Rehab Potential surgery 02/07/17 and current 11 weeks 04/25/17   PT Frequency 2x / week   PT Duration 8 weeks   PT Treatment/Interventions ADLs/Self Care Home Management;Cryotherapy;Electrical Stimulation;Ultrasound;Neuromuscular re-education;Therapeutic exercise;Therapeutic activities;Functional mobility training;Patient/family education;Manual techniques;Passive range of motion;Dry needling;Taping;Vasopneumatic Device   PT  Next Visit Plan Continue with AROM exercises per protocol. (MD. Case 04/24/17)  New Order from MD to start Bands. See new Order   PT Home Exercise Plan Pendulums, cane flexion and ER   Consulted and Agree with Plan of Care Patient      Patient will benefit from skilled therapeutic intervention in order to improve the following deficits and impairments:  Postural dysfunction, Decreased range of motion, Impaired UE functional use, Pain, Decreased strength, Decreased activity tolerance  Visit Diagnosis: Acute pain of right shoulder  Stiffness of right shoulder, not elsewhere classified  Localized edema  Shoulder weakness       G-Codes - 05/13/17 1104    Functional Assessment Tool Used (Outpatient Only) FOTO.....38% limitation.   Functional Limitation Carrying, moving and handling objects   Carrying, Moving and Handling Objects Current Status [phone]) At least 20 percent but less than 40 percent impaired, limited or restricted   Carrying, Moving and Handling Objects Goal Status (U0454) At least 20 percent but less than 40 percent impaired, limited or restricted      Problem List Patient Active Problem List   Diagnosis Date Noted  . Guaiac positive stools 10/24/2016  . Precordial pain 12/30/2012  . HTN (hypertension)   . Diabetes mellitus type 2, noninsulin dependent (HCC)   . Hyperlipidemia   . Morbid obesity (HCC)   . CKD (chronic kidney disease) stage 3, GFR 30-59 ml/min   . Anginal pain (HCC) 12/29/2012    Seen by Florence Canner PTA [place], [place], MPT 2017-05-13, 11:05 AM  [facility] [address] [place], [place], [address] Phone: [phone]   Fax:  [phone]  Name: [name] MRN: [id number] Date of Birth: 02/15/1961

## 2017-05-07 ENCOUNTER — Ambulatory Visit: Payer: Medicare Other | Admitting: Physical Therapy

## 2017-05-07 DIAGNOSIS — R29898 Other symptoms and signs involving the musculoskeletal system: Secondary | ICD-10-CM

## 2017-05-07 DIAGNOSIS — M25611 Stiffness of right shoulder, not elsewhere classified: Secondary | ICD-10-CM

## 2017-05-07 DIAGNOSIS — M25511 Pain in right shoulder: Secondary | ICD-10-CM

## 2017-05-07 DIAGNOSIS — R600 Localized edema: Secondary | ICD-10-CM

## 2017-05-07 NOTE — Therapy (Signed)
[facility] [address] [place], [place], [address] Phone: [phone]   Fax:  [phone]  Physical Therapy Treatment  Patient Details  Name: [name] MRN: [id number] Date of Birth: [birth year] Referring Provider: Dr. Viviann SpareSteven Case  Encounter Date: 05/07/2017      PT End of Session - 05/07/17 0815    Visit Number 21   Number of Visits 24   Date for PT Re-Evaluation 05/22/17   Authorization Type UHC/Medicare   PT Start Time 0815   PT Stop Time 0918   PT Time Calculation (min) 63 min   Activity Tolerance Patient tolerated treatment well   Behavior During Therapy [facility] for tasks assessed/performed      Past Medical History:  Diagnosis Date  . Anginal pain (HCC) 12/29/2012  . Chronic low back pain   . CKD (chronic kidney disease) stage 3, GFR 30-59 ml/min   . Diabetes mellitus type 2, noninsulin dependent (HCC)   . Exertional shortness of breath   . GERD (gastroesophageal reflux disease)   . H/O hiatal hernia   . Headache(784.0)    "not daily, not weekly, but very often" (12/30/2012)  . HTN (hypertension)   . Hyperlipidemia   . Morbid obesity (HCC)   . Osteoarthritis    "hands" (12/30/2012)    Past Surgical History:  Procedure Laterality Date  . ELBOW ARTHROPLASTY Left 1990's   "3 spurs; ?14 staples" (12/30/2012)  . LEFT HEART CATHETERIZATION WITH CORONARY ANGIOGRAM N/A 12/31/2012   Procedure: LEFT HEART CATHETERIZATION WITH CORONARY ANGIOGRAM;  Surgeon: Tonny BollmanMichael Cooper, MD;  [facility];  Service: Cardiovascular;  Laterality: N/A;  . LUMBAR DISC SURGERY  2010; 2011   "2 ruptured discs; 2 pinched nerves"    There were no vitals filed for this visit.      Subjective Assessment - 05/07/17 0817    Subjective Reports that he has some pain last night but biofreeze lowered the pain.   How long can you sit comfortably? unlimied   How long can you stand comfortably? unlimited   How long can you walk comfortably?  unlimited   Currently in Pain? No/denies   Pain Score 0-No pain   Pain Onset More than a month ago                         [facility] Treatment/Exercise - 05/07/17 0815      Shoulder Exercises: Supine   Protraction Strengthening;Right;20 reps;Weights   Protraction Weight (lbs) 2   Flexion Strengthening;Right;20 reps;Weights   Shoulder Flexion Weight (lbs) 2   Other Supine Exercises R shoulder circles at 90 dg flexion CW/CCW 2# x10 each way     Shoulder Exercises: Prone   Retraction Strengthening;Right;20 reps;Weights   Retraction Weight (lbs) 2   Retraction Limitations row + scap retraction   Extension Strengthening;Right;20 reps;Weights   Extension Weight (lbs) 2   Horizontal ABduction 1 Strengthening;Right;20 reps;Weights   Horizontal ABduction 1 Weight (lbs) 2   Horizontal ABduction 1 Limitations "T"   Horizontal ABduction 2 AROM;Right   Horizontal ABduction 2 Limitations "Y"     Shoulder Exercises: Sidelying   External Rotation Strengthening;20 reps;Weights   External Rotation Weight (lbs) 1   External Rotation Limitations 10 reps w/o wt; 10 reps w/ 1#   Flexion Strengthening;20 reps;Weights   Flexion Weight (lbs) 1   ABduction Strengthening;Right;20 reps;Weights   ABduction Weight (lbs) 1   Other Sidelying Exercises R shoulder circles at 90 dg  ABD CW/CCW 2# x10 each way     Shoulder Exercises: Standing   Protraction Strengthening;Right;20 reps;Theraband   Theraband Level (Shoulder Protraction) Level 2 (Red)   External Rotation Strengthening;Right;20 reps;Theraband   Theraband Level (Shoulder External Rotation) Level 2 (Red)   Internal Rotation Strengthening;Right;20 reps;Theraband   Theraband Level (Shoulder Internal Rotation) Level 2 (Red)   Extension Strengthening;Right;20 reps;Theraband   Theraband Level (Shoulder Extension) Level 2 (Red)   Row Strengthening;Right;20 reps;Theraband   Theraband Level (Shoulder Row) Level 2 (Red)      Shoulder Exercises: ROM/Strengthening   UBE (Upper Arm Bike) fwd/back x 3' each     Modalities   Modalities Proofreaderlectrical Stimulation;Vasopneumatic     Electrical Stimulation   Electrical Stimulation Location R shoulder   Electrical Stimulation Action IFC   Electrical Stimulation Parameters 1-10 hz x15 min   Electrical Stimulation Goals Pain     Vasopneumatic   Number Minutes Vasopneumatic  15 minutes   Vasopnuematic Location  Shoulder   Vasopneumatic Pressure Low   Vasopneumatic Temperature  67     Manual Therapy   Manual Therapy Soft tissue mobilization   Soft tissue mobilization STM to R pecs & posterior capsule                  PT Short Term Goals - 04/08/17 0805      PT SHORT TERM GOAL #1   Title Pt will be independent in his HEP.   Time 3   Period Weeks   Status Achieved           PT Long Term Goals - 05/02/17 [id number]      PT LONG TERM GOAL #1   Title Patient will be independent in his HEP and progression.    Time 8   Period Weeks   Status Achieved     PT LONG TERM GOAL #2   Title Patient will improve his FOTO score from 52% limiitation to >/= 34% limitation   Time 8   Period Weeks   Status On-going     PT LONG TERM GOAL #3   Title Patient will be able to perform Right shoulder flexion to >/= 140 degrees with pain less than 3/10.    Time 8   Period Weeks   Status On-going     PT LONG TERM GOAL #4   Title Patient will be able to perform ADL's with no pain.   Time 8   Period Weeks   Status On-going     PT LONG TERM GOAL #5   Title Patient will increase his R shoulder flexion and abduction strength to >/= 4/5 in order to improve functional mobility.   Time 8   Period Weeks   Status On-going               Plan - 05/07/17 [id number]    Clinical Impression Statement Pt tolerating progression of resistance with strengthening w/o increased pain. ROM restrictions appear to be at least in part to continued tightness/tenderness in R pecs &  posterior capsule, esp teres group. Pt ttp with STM but able to tolerate some TPR with improved ROM noted following manulal therapy.   Rehab Potential Excellent   Clinical Impairments Affecting Rehab Potential surgery 02/07/17 and current 11 weeks 04/25/17   PT Frequency 2x / week   PT Duration 8 weeks   PT Treatment/Interventions ADLs/Self Care Home Management;Cryotherapy;Electrical Stimulation;Ultrasound;Neuromuscular re-education;Therapeutic exercise;Therapeutic activities;Functional mobility training;Patient/family education;Manual techniques;Passive range of motion;Dry needling;Taping;Vasopneumatic Device   PT Next Visit  Plan Continue with AROM exercises per protocol. (MD. Case 04/24/17)  New Order from MD to start Bands. See new Order   PT Home Exercise Plan Pendulums, cane flexion and ER   Consulted and Agree with Plan of Care Patient      Patient will benefit from skilled therapeutic intervention in order to improve the following deficits and impairments:  Postural dysfunction, Decreased range of motion, Impaired UE functional use, Pain, Decreased strength, Decreased activity tolerance  Visit Diagnosis: Acute pain of right shoulder  Stiffness of right shoulder, not elsewhere classified  Localized edema  Shoulder weakness     Problem List Patient Active Problem List   Diagnosis Date Noted  . Guaiac positive stools 10/24/2016  . Precordial pain 12/30/2012  . HTN (hypertension)   . Diabetes mellitus type 2, noninsulin dependent (HCC)   . Hyperlipidemia   . Morbid obesity (HCC)   . CKD (chronic kidney disease) stage 3, GFR 30-59 ml/min   . Anginal pain (HCC) 12/29/2012    Marry Guan, PT, MPT 05/07/2017, 9:16 AM  [facility] [address] [place], [place], [address] Phone: [phone]   Fax:  [phone]  Name: [name] MRN: [id number] Date of Birth: 1960-12-25

## 2017-05-09 ENCOUNTER — Ambulatory Visit: Payer: Medicare Other | Admitting: Physical Therapy

## 2017-05-09 DIAGNOSIS — M25511 Pain in right shoulder: Secondary | ICD-10-CM | POA: Diagnosis not present

## 2017-05-09 DIAGNOSIS — M25611 Stiffness of right shoulder, not elsewhere classified: Secondary | ICD-10-CM

## 2017-05-09 NOTE — Therapy (Signed)
[facility] [address] [place], [place], [address] Phone: [phone]   Fax:  [phone]  Physical Therapy Treatment  Patient Details  Name: [name] MRN: [id number] Date of Birth: Feb 25, 1961 Referring Provider: Dr. Viviann SpareSteven Case  Encounter Date: 05/09/2017      PT End of Session - 05/09/17 0943    Visit Number 22   Number of Visits 24   Date for PT Re-Evaluation 05/22/17   Authorization Type UHC/Medicare   PT Start Time 0730   PT Stop Time 0825   PT Time Calculation (min) 55 min   Activity Tolerance Patient tolerated treatment well   Behavior During Therapy [facility] for tasks assessed/performed      Past Medical History:  Diagnosis Date  . Anginal pain (HCC) 12/29/2012  . Chronic low back pain   . CKD (chronic kidney disease) stage 3, GFR 30-59 ml/min   . Diabetes mellitus type 2, noninsulin dependent (HCC)   . Exertional shortness of breath   . GERD (gastroesophageal reflux disease)   . H/O hiatal hernia   . Headache(784.0)    "not daily, not weekly, but very often" (12/30/2012)  . HTN (hypertension)   . Hyperlipidemia   . Morbid obesity (HCC)   . Osteoarthritis    "hands" (12/30/2012)    Past Surgical History:  Procedure Laterality Date  . ELBOW ARTHROPLASTY Left 1990's   "3 spurs; ?14 staples" (12/30/2012)  . LEFT HEART CATHETERIZATION WITH CORONARY ANGIOGRAM N/A 12/31/2012   Procedure: LEFT HEART CATHETERIZATION WITH CORONARY ANGIOGRAM;  Surgeon: Tonny BollmanMichael Cooper, MD;  [facility];  Service: Cardiovascular;  Laterality: N/A;  . LUMBAR DISC SURGERY  2010; 2011   "2 ruptured discs; 2 pinched nerves"    There were no vitals filed for this visit.      Subjective Assessment - 05/09/17 0937    Subjective Shoulder feels tight.   Pain Score 1    Pain Location Shoulder   Pain Orientation Right   Pain Descriptors / Indicators Tightness   Pain Type Surgical pain                          [facility] Adult PT Treatment/Exercise - 05/09/17 0001      Exercises   Exercises Shoulder     Shoulder Exercises: Pulleys   Flexion Limitations 5 minutes.     Modalities   Modalities Electrical Stimulation;Moist Heat     Moist Heat Therapy   Number Minutes Moist Heat 20 Minutes   Moist Heat Location --  Right shoulder.     Programme researcher, broadcasting/film/videolectrical Stimulation   Electrical Stimulation Location Right shoulder.   Electrical Stimulation Action IFC   Electrical Stimulation Parameters 80-150 hz x 20 minutes.   Electrical Stimulation Goals Pain     Manual Therapy   Manual Therapy Passive ROM   Passive ROM PROM to patient's right shoulder x 18 minutes into flexion and ER.                  PT Short Term Goals - 04/08/17 0805      PT SHORT TERM GOAL #1   Title Pt will be independent in his HEP.   Time 3   Period Weeks   Status Achieved           PT Long Term Goals - 05/02/17 [id number]      PT LONG TERM GOAL #1   Title Patient will be independent in his HEP and  progression.    Time 8   Period Weeks   Status Achieved     PT LONG TERM GOAL #2   Title Patient will improve his FOTO score from 52% limiitation to >/= 34% limitation   Time 8   Period Weeks   Status On-going     PT LONG TERM GOAL #3   Title Patient will be able to perform Right shoulder flexion to >/= 140 degrees with pain less than 3/10.    Time 8   Period Weeks   Status On-going     PT LONG TERM GOAL #4   Title Patient will be able to perform ADL's with no pain.   Time 8   Period Weeks   Status On-going     PT LONG TERM GOAL #5   Title Patient will increase his R shoulder flexion and abduction strength to >/= 4/5 in order to improve functional mobility.   Time 8   Period Weeks   Status On-going               Plan - 05/09/17 0944    Clinical Impression Statement Patient experiencing referred pain to middle deltoid of right shoulder.  He needs continued stretching to improve range of motion.       Patient will benefit from skilled therapeutic intervention in order to improve the following deficits and impairments:     Visit Diagnosis: Acute pain of right shoulder  Stiffness of right shoulder, not elsewhere classified     Problem List Patient Active Problem List   Diagnosis Date Noted  . Guaiac positive stools 10/24/2016  . Precordial pain 12/30/2012  . HTN (hypertension)   . Diabetes mellitus type 2, noninsulin dependent (HCC)   . Hyperlipidemia   . Morbid obesity (HCC)   . CKD (chronic kidney disease) stage 3, GFR 30-59 ml/min   . Anginal pain (HCC) 12/29/2012    [name], [place] MPT 05/09/2017, 10:07 AM  [facility] [address] [place], [place], [address] Phone: [phone]   Fax:  [phone]  Name: [name] MRN: [id number] Date of Birth: 1961/02/23

## 2017-05-14 ENCOUNTER — Ambulatory Visit: Payer: Medicare Other | Attending: Orthopedic Surgery | Admitting: *Deleted

## 2017-05-14 DIAGNOSIS — M25511 Pain in right shoulder: Secondary | ICD-10-CM | POA: Diagnosis present

## 2017-05-14 DIAGNOSIS — M25611 Stiffness of right shoulder, not elsewhere classified: Secondary | ICD-10-CM | POA: Insufficient documentation

## 2017-05-14 DIAGNOSIS — R29898 Other symptoms and signs involving the musculoskeletal system: Secondary | ICD-10-CM | POA: Diagnosis present

## 2017-05-14 DIAGNOSIS — R600 Localized edema: Secondary | ICD-10-CM | POA: Insufficient documentation

## 2017-05-14 NOTE — Therapy (Signed)
[facility] [address] [place], [place], [address] Phone: [phone]   Fax:  [phone]  Physical Therapy Treatment  Patient Details  Name: [name] W Card MRN: [id number] Date of Birth: 1961/10/13 Referring Provider: Dr. Viviann SpareSteven Case  Encounter Date: 05/14/2017      PT End of Session - 05/14/17 0821    Visit Number 23   Number of Visits 24   Date for PT Re-Evaluation 05/22/17   Authorization Type UHC/Medicare   PT Start Time 0815   PT Stop Time 0915   PT Time Calculation (min) 60 min      Past Medical History:  Diagnosis Date  . Anginal pain (HCC) 12/29/2012  . Chronic low back pain   . CKD (chronic kidney disease) stage 3, GFR 30-59 ml/min   . Diabetes mellitus type 2, noninsulin dependent (HCC)   . Exertional shortness of breath   . GERD (gastroesophageal reflux disease)   . H/O hiatal hernia   . Headache(784.0)    "not daily, not weekly, but very often" (12/30/2012)  . HTN (hypertension)   . Hyperlipidemia   . Morbid obesity (HCC)   . Osteoarthritis    "hands" (12/30/2012)    Past Surgical History:  Procedure Laterality Date  . ELBOW ARTHROPLASTY Left 1990's   "3 spurs; ?14 staples" (12/30/2012)  . LEFT HEART CATHETERIZATION WITH CORONARY ANGIOGRAM N/A 12/31/2012   Procedure: LEFT HEART CATHETERIZATION WITH CORONARY ANGIOGRAM;  Surgeon: Tonny BollmanMichael Cooper, MD;  [facility];  Service: Cardiovascular;  Laterality: N/A;  . LUMBAR DISC SURGERY  2010; 2011   "2 ruptured discs; 2 pinched nerves"    There were no vitals filed for this visit.      Subjective Assessment - 05/14/17 0818    Subjective RT shldr is still tight.   Pertinent History RT shldr sx 02-07-17   Currently in Pain? No/denies                         [facility]/Exercise - 05/14/17 0001      Exercises   Exercises Shoulder;Elbow     Elbow Exercises   Elbow Flexion Strengthening  3x10   Bar Weights/Barbell  (Elbow Flexion) 5 lbs     Shoulder Exercises: Prone   Retraction Strengthening;Right;20 reps;5 reps   Retraction Weight (lbs) 5   Extension Strengthening;Right;20 reps;Weights;5 reps  4#   Extension Weight (lbs) 4     Shoulder Exercises: Standing   Flexion Strengthening;Right  5x10 to 90 degrees with good technique   Shoulder Flexion Weight (lbs) 3     Shoulder Exercises: Pulleys   Flexion Limitations 5 minutes.     Shoulder Exercises: ROM/Strengthening   UBE (Upper Arm Bike) 30 RPMs x 5 mins     Modalities   Modalities Electrical Stimulation;Moist Heat     Moist Heat Therapy   Number Minutes Moist Heat 20 Minutes   Moist Heat Location --  Right shoulder.     Programme researcher, broadcasting/film/videolectrical Stimulation   Electrical Stimulation Location Right shoulder.  IFC x 15 mins 80-150hz    Electrical Stimulation Goals Pain     Vasopneumatic   Number Minutes Vasopneumatic  15 minutes   Vasopnuematic Location  Shoulder   Vasopneumatic Pressure Low   Vasopneumatic Temperature  36     Manual Therapy   Manual Therapy Passive ROM   Passive ROM PROM to patient's right shoulder with focus on  ER.  PT Short Term Goals - 04/08/17 0805      PT SHORT TERM GOAL #1   Title Pt will be independent in his HEP.   Time 3   Period Weeks   Status Achieved           PT Long Term Goals - 05/02/17 [id number]      PT LONG TERM GOAL #1   Title Patient will be independent in his HEP and progression.    Time 8   Period Weeks   Status Achieved     PT LONG TERM GOAL #2   Title Patient will improve his FOTO score from 52% limiitation to >/= 34% limitation   Time 8   Period Weeks   Status On-going     PT LONG TERM GOAL #3   Title Patient will be able to perform Right shoulder flexion to >/= 140 degrees with pain less than 3/10.    Time 8   Period Weeks   Status On-going     PT LONG TERM GOAL #4   Title Patient will be able to perform ADL's with no pain.   Time 8   Period Weeks    Status On-going     PT LONG TERM GOAL #5   Title Patient will increase his R shoulder flexion and abduction strength to >/= 4/5 in order to improve functional mobility.   Time 8   Period Weeks   Status On-going               Plan - 05/14/17 0825    Clinical Impression Statement Pt arrived today with RT shldr mainly feeling sore and stiff. He did well with progression of PREs and PROM. His PROM for ER was 45 degrees today and this remains his greatest deficit   Rehab Potential Excellent   Clinical Impairments Affecting Rehab Potential surgery 02/07/17 and current 11 weeks 04/25/17   PT Frequency 2x / week   PT Duration 8 weeks   PT Treatment/Interventions ADLs/Self Care Home Management;Cryotherapy;Electrical Stimulation;Ultrasound;Neuromuscular re-education;Therapeutic exercise;Therapeutic activities;Functional mobility training;Patient/family education;Manual techniques;Passive range of motion;Dry needling;Taping;Vasopneumatic Device   PT Next Visit Plan Continue with AROM exercises per protocol. (MD. Case 04/24/17)  New Order from MD to start Bands. See new Order   PT Home Exercise Plan Pendulums, cane flexion and ER   Consulted and Agree with Plan of Care Patient      Patient will benefit from skilled therapeutic intervention in order to improve the following deficits and impairments:  Postural dysfunction, Decreased range of motion, Impaired UE functional use, Pain, Decreased strength, Decreased activity tolerance  Visit Diagnosis: Acute pain of right shoulder  Stiffness of right shoulder, not elsewhere classified  Localized edema  Shoulder weakness     Problem List Patient Active Problem List   Diagnosis Date Noted  . Guaiac positive stools 10/24/2016  . Precordial pain 12/30/2012  . HTN (hypertension)   . Diabetes mellitus type 2, noninsulin dependent (HCC)   . Hyperlipidemia   . Morbid obesity (HCC)   . CKD (chronic kidney disease) stage 3, GFR 30-59 ml/min    . Anginal pain (HCC) 12/29/2012    Winfrey Chillemi,[name], PTA  05/14/2017, 9:29 AM  [facility] [address] [place], [place], [address] Phone: [phone]   Fax:  [phone]  Name: [name] W Cianci MRN: [id number] Date of Birth: [birth year]

## 2017-05-16 ENCOUNTER — Ambulatory Visit: Payer: Medicare Other | Admitting: *Deleted

## 2017-05-16 DIAGNOSIS — R29898 Other symptoms and signs involving the musculoskeletal system: Secondary | ICD-10-CM

## 2017-05-16 DIAGNOSIS — M25611 Stiffness of right shoulder, not elsewhere classified: Secondary | ICD-10-CM

## 2017-05-16 DIAGNOSIS — M25511 Pain in right shoulder: Secondary | ICD-10-CM

## 2017-05-16 DIAGNOSIS — R600 Localized edema: Secondary | ICD-10-CM

## 2017-05-16 NOTE — Therapy (Signed)
[facility] [address] [place], [place], [address] Phone: [phone]   Fax:  [phone]  Physical Therapy Treatment  Patient Details  Name: [name] MRN: [id number] Date of Birth: [birth year] Referring Provider: Dr. Viviann SpareSteven Case  Encounter Date: 05/16/2017      PT End of Session - 05/16/17 0823    Visit Number 24   Number of Visits 30  N.O on 04-24-17 for 6wks   Date for PT Re-Evaluation 06/05/17   Authorization Type UHC/Medicare   PT Start Time 0815   PT Stop Time 0908   PT Time Calculation (min) 53 min      Past Medical History:  Diagnosis Date  . Anginal pain (HCC) 12/29/2012  . Chronic low back pain   . CKD (chronic kidney disease) stage 3, GFR 30-59 ml/min   . Diabetes mellitus type 2, noninsulin dependent (HCC)   . Exertional shortness of breath   . GERD (gastroesophageal reflux disease)   . H/O hiatal hernia   . Headache(784.0)    "not daily, not weekly, but very often" (12/30/2012)  . HTN (hypertension)   . Hyperlipidemia   . Morbid obesity (HCC)   . Osteoarthritis    "hands" (12/30/2012)    Past Surgical History:  Procedure Laterality Date  . ELBOW ARTHROPLASTY Left 1990's   "3 spurs; ?14 staples" (12/30/2012)  . LEFT HEART CATHETERIZATION WITH CORONARY ANGIOGRAM N/A 12/31/2012   Procedure: LEFT HEART CATHETERIZATION WITH CORONARY ANGIOGRAM;  Surgeon: Tonny BollmanMichael Cooper, MD;  [facility];  Service: Cardiovascular;  Laterality: N/A;  . LUMBAR DISC SURGERY  2010; 2011   "2 ruptured discs; 2 pinched nerves"    There were no vitals filed for this visit.      Subjective Assessment - 05/16/17 0817    Subjective RT shldr is still tight.and sore. See MD 06-05-17   Pertinent History RT shldr sx 02-07-17   How long can you sit comfortably? unlimied   How long can you stand comfortably? unlimited   How long can you walk comfortably? unlimited   Currently in Pain? Yes   Pain Score 2    Pain Location  Shoulder   Pain Orientation Right   Pain Descriptors / Indicators Tightness   Pain Type Surgical pain   Pain Onset More than a month ago   Pain Frequency Intermittent                         [facility] Adult PT Treatment/Exercise - 05/16/17 0001      Exercises   Exercises Shoulder;Elbow     Elbow Exercises   Elbow Flexion Strengthening  4x10   Bar Weights/Barbell (Elbow Flexion) 5 lbs     Shoulder Exercises: Prone   Retraction Strengthening;Right;20 reps;5 reps   Retraction Weight (lbs) 5   Extension Strengthening;Right;20 reps;Weights;5 reps   Extension Weight (lbs) 4     Shoulder Exercises: Standing   Flexion Strengthening;Right  6 x10 to 90 degrees with good technique   Shoulder Flexion Weight (lbs) 3     Shoulder Exercises: Pulleys   Flexion Limitations 5 minutes.     Shoulder Exercises: ROM/Strengthening   UBE (Upper Arm Bike) 30 RPMs x 6 mins     Modalities   Modalities Electrical Stimulation;Moist Heat     Electrical Stimulation   Electrical Stimulation Location Right shoulder.  IFC x 15 mins 80-150hz    Electrical Stimulation Goals Pain     Vasopneumatic   Number Minutes  Vasopneumatic  15 minutes   Vasopnuematic Location  Shoulder   Vasopneumatic Pressure Low   Vasopneumatic Temperature  36     Manual Therapy   Manual Therapy Passive ROM   Passive ROM PROM to patient's right shoulder with focus on  ER.                  PT Short Term Goals - 04/08/17 0805      PT SHORT TERM GOAL #1   Title Pt will be independent in his HEP.   Time 3   Period Weeks   Status Achieved           PT Long Term Goals - 05/02/17 5409      PT LONG TERM GOAL #1   Title Patient will be independent in his HEP and progression.    Time 8   Period Weeks   Status Achieved     PT LONG TERM GOAL #2   Title Patient will improve his FOTO score from 52% limiitation to >/= 34% limitation   Time 8   Period Weeks   Status On-going     PT LONG TERM  GOAL #3   Title Patient will be able to perform Right shoulder flexion to >/= 140 degrees with pain less than 3/10.    Time 8   Period Weeks   Status On-going     PT LONG TERM GOAL #4   Title Patient will be able to perform ADL's with no pain.   Time 8   Period Weeks   Status On-going     PT LONG TERM GOAL #5   Title Patient will increase his R shoulder flexion and abduction strength to >/= 4/5 in order to improve functional mobility.   Time 8   Period Weeks   Status On-going               Plan - 05/16/17 0915    Clinical Impression Statement Pt arrived today doing fairly well just soreness.in RT shldr. He mainly wants to focus on strengthening and is satisfied with current ROM. He has RT elbow arthritis and feels that some of the stretching is aggrivating it. ROM today PROM 145 degrees and ER to 45 degrees   Rehab Potential Excellent   Clinical Impairments Affecting Rehab Potential surgery 02/07/17 and current 11 weeks 04/25/17   PT Frequency 2x / week   PT Duration 8 weeks   PT Treatment/Interventions ADLs/Self Care Home Management;Cryotherapy;Electrical Stimulation;Ultrasound;Neuromuscular re-education;Therapeutic exercise;Therapeutic activities;Functional mobility training;Patient/family education;Manual techniques;Passive range of motion;Dry needling;Taping;Vasopneumatic Device   PT Next Visit Plan Continue with AROM exercises per protocol. (MD. Case 04/24/17)  New Order from MD to start Bands. See new Order   PT Home Exercise Plan Pendulums, cane flexion and ER   Consulted and Agree with Plan of Care Patient      Patient will benefit from skilled therapeutic intervention in order to improve the following deficits and impairments:  Postural dysfunction, Decreased range of motion, Impaired UE functional use, Pain, Decreased strength, Decreased activity tolerance  Visit Diagnosis: Acute pain of right shoulder  Stiffness of right shoulder, not elsewhere  classified  Localized edema  Shoulder weakness     Problem List Patient Active Problem List   Diagnosis Date Noted  . Guaiac positive stools 10/24/2016  . Precordial pain 12/30/2012  . HTN (hypertension)   . Diabetes mellitus type 2, noninsulin dependent (HCC)   . Hyperlipidemia   . Morbid obesity (HCC)   . CKD (  chronic kidney disease) stage 3, GFR 30-59 ml/min   . Anginal pain (HCC) 12/29/2012    Aletheia Tangredi,[name]., PTA 05/16/2017, 9:25 AM  [facility] [address] [place], [place], [address] Phone: [phone]   Fax:  [phone]  Name: [name] MRN: [id number] Date of Birth: 1961/04/21

## 2017-05-20 ENCOUNTER — Ambulatory Visit: Payer: Medicare Other | Admitting: *Deleted

## 2017-05-20 DIAGNOSIS — M25511 Pain in right shoulder: Secondary | ICD-10-CM | POA: Diagnosis not present

## 2017-05-20 DIAGNOSIS — R600 Localized edema: Secondary | ICD-10-CM

## 2017-05-20 DIAGNOSIS — R29898 Other symptoms and signs involving the musculoskeletal system: Secondary | ICD-10-CM

## 2017-05-20 DIAGNOSIS — M25611 Stiffness of right shoulder, not elsewhere classified: Secondary | ICD-10-CM

## 2017-05-20 NOTE — Therapy (Signed)
[facility] [address] [place], [place], [address] Phone: [phone]   Fax:  [phone]  Physical Therapy Treatment  Patient Details  Name: [name] MRN: [id number] Date of Birth: [birth year] Referring Provider: Dr. Viviann Spare Case  Encounter Date: 05/20/2017      PT End of Session - 05/20/17 0914    Visit Number 25   Number of Visits 30   Date for PT Re-Evaluation 06/05/17   Authorization Type UHC/Medicare   PT Start Time 0900   PT Stop Time 0951   PT Time Calculation (min) 51 min      Past Medical History:  Diagnosis Date  . Anginal pain (HCC) 12/29/2012  . Chronic low back pain   . CKD (chronic kidney disease) stage 3, GFR 30-59 ml/min   . Diabetes mellitus type 2, noninsulin dependent (HCC)   . Exertional shortness of breath   . GERD (gastroesophageal reflux disease)   . H/O hiatal hernia   . Headache(784.0)    "not daily, not weekly, but very often" (12/30/2012)  . HTN (hypertension)   . Hyperlipidemia   . Morbid obesity (HCC)   . Osteoarthritis    "hands" (12/30/2012)    Past Surgical History:  Procedure Laterality Date  . ELBOW ARTHROPLASTY Left 1990's   "3 spurs; ?14 staples" (12/30/2012)  . LEFT HEART CATHETERIZATION WITH CORONARY ANGIOGRAM N/A 12/31/2012   Procedure: LEFT HEART CATHETERIZATION WITH CORONARY ANGIOGRAM;  Surgeon: Tonny Bollman, MD;  [facility];  Service: Cardiovascular;  Laterality: N/A;  . LUMBAR DISC SURGERY  2010; 2011   "2 ruptured discs; 2 pinched nerves"    There were no vitals filed for this visit.      Subjective Assessment - 05/20/17 0905    Subjective RT shldr is still tight.and sore. See MD 06-05-17  worked out with some wts at home   Pertinent History RT shldr sx 02-07-17   How long can you sit comfortably? unlimied   How long can you stand comfortably? unlimited   How long can you walk comfortably? unlimited   Currently in Pain? Yes   Pain Score 2    Pain  Location Shoulder   Pain Orientation Right   Pain Descriptors / Indicators Sore   Pain Type Surgical pain                         [facility] Adult PT Treatment/Exercise - 05/20/17 0001      Exercises   Exercises Shoulder;Elbow     Elbow Exercises   Elbow Flexion Strengthening  4x10   Bar Weights/Barbell (Elbow Flexion) 5 lbs     Shoulder Exercises: Prone   Retraction Strengthening;Right;20 reps;15 reps;5 reps   Retraction Weight (lbs) 5   Extension Strengthening;Right;20 reps;Weights;15 reps;5 reps   Extension Weight (lbs) 5     Shoulder Exercises: Standing   Flexion Strengthening;Right  6 x10 to 90 degrees with good technique   Shoulder Flexion Weight (lbs) 3   Other Standing Exercises PNF patterns RT UE 4# ball 2x10,  6# ball elevation Bil UEs 2x10     Shoulder Exercises: Pulleys   Flexion Limitations 5 minutes.     Shoulder Exercises: ROM/Strengthening   UBE (Upper Arm Bike) 60 RPMs x 6 mins     Modalities   Modalities Electrical Stimulation;Moist Heat     Electrical Stimulation   Electrical Stimulation Location Right shoulder.  IFC x 15 mins 80-150hz    Electrical Stimulation Goals Pain  Vasopneumatic   Number Minutes Vasopneumatic  15 minutes   Vasopnuematic Location  Shoulder   Vasopneumatic Pressure Low   Vasopneumatic Temperature  36                  PT Short Term Goals - 04/08/17 0805      PT SHORT TERM GOAL #1   Title Pt will be independent in his HEP.   Time 3   Period Weeks   Status Achieved           PT Long Term Goals - 05/02/17 [id number]      PT LONG TERM GOAL #1   Title Patient will be independent in his HEP and progression.    Time 8   Period Weeks   Status Achieved     PT LONG TERM GOAL #2   Title Patient will improve his FOTO score from 52% limiitation to >/= 34% limitation   Time 8   Period Weeks   Status On-going     PT LONG TERM GOAL #3   Title Patient will be able to perform Right shoulder flexion  to >/= 140 degrees with pain less than 3/10.    Time 8   Period Weeks   Status On-going     PT LONG TERM GOAL #4   Title Patient will be able to perform ADL's with no pain.   Time 8   Period Weeks   Status On-going     PT LONG TERM GOAL #5   Title Patient will increase his R shoulder flexion and abduction strength to >/= 4/5 in order to improve functional mobility.   Time 8   Period Weeks   Status On-going               Plan - 05/20/17 1006    Clinical Impression Statement Pt arrived today with increased soreness in RT shldr due to working out with some wts at home. He was able to complete all therex today for RT shldr, but was sore throughout. Decreased soreness after modalities      Patient will benefit from skilled therapeutic intervention in order to improve the following deficits and impairments:     Visit Diagnosis: Acute pain of right shoulder  Localized edema  Stiffness of right shoulder, not elsewhere classified  Shoulder weakness     Problem List Patient Active Problem List   Diagnosis Date Noted  . Guaiac positive stools 10/24/2016  . Precordial pain 12/30/2012  . HTN (hypertension)   . Diabetes mellitus type 2, noninsulin dependent (HCC)   . Hyperlipidemia   . Morbid obesity (HCC)   . CKD (chronic kidney disease) stage 3, GFR 30-59 ml/min   . Anginal pain (HCC) 12/29/2012    Strickland,Russell, pTA 05/20/2017, 10:38 AM  [facility] [address] [place], [place], [address] Phone: [phone]   Fax:  [phone]  Name: [name] MRN: [id number] Date of Birth: 06-Mar-1961

## 2017-05-22 ENCOUNTER — Ambulatory Visit: Payer: Medicare Other | Admitting: *Deleted

## 2017-05-22 DIAGNOSIS — M25511 Pain in right shoulder: Secondary | ICD-10-CM

## 2017-05-22 DIAGNOSIS — R29898 Other symptoms and signs involving the musculoskeletal system: Secondary | ICD-10-CM

## 2017-05-22 DIAGNOSIS — M25611 Stiffness of right shoulder, not elsewhere classified: Secondary | ICD-10-CM

## 2017-05-22 DIAGNOSIS — R600 Localized edema: Secondary | ICD-10-CM

## 2017-05-22 NOTE — Therapy (Signed)
[facility] [address] [place], [place], [address] Phone: [phone]   Fax:  [phone]  Physical Therapy Treatment  Patient Details  Name: [name] MRN: [id number] Date of Birth: 1961-09-15 Referring Provider: Dr. Viviann SpareSteven Case  Encounter Date: 05/22/2017      PT End of Session - 05/22/17 0925    Visit Number 26   Number of Visits 30   Date for PT Re-Evaluation 06/05/17   Authorization Type UHC/Medicare      MD 06-05-17   PT Start Time 0900   PT Stop Time 0952   PT Time Calculation (min) 52 min   Activity Tolerance Patient tolerated treatment well   Behavior During Therapy [facility] for tasks assessed/performed      Past Medical History:  Diagnosis Date  . Anginal pain (HCC) 12/29/2012  . Chronic low back pain   . CKD (chronic kidney disease) stage 3, GFR 30-59 ml/min   . Diabetes mellitus type 2, noninsulin dependent (HCC)   . Exertional shortness of breath   . GERD (gastroesophageal reflux disease)   . H/O hiatal hernia   . Headache(784.0)    "not daily, not weekly, but very often" (12/30/2012)  . HTN (hypertension)   . Hyperlipidemia   . Morbid obesity (HCC)   . Osteoarthritis    "hands" (12/30/2012)    Past Surgical History:  Procedure Laterality Date  . ELBOW ARTHROPLASTY Left 1990's   "3 spurs; ?14 staples" (12/30/2012)  . LEFT HEART CATHETERIZATION WITH CORONARY ANGIOGRAM N/A 12/31/2012   Procedure: LEFT HEART CATHETERIZATION WITH CORONARY ANGIOGRAM;  Surgeon: Tonny BollmanMichael Cooper, MD;  [facility];  Service: Cardiovascular;  Laterality: N/A;  . LUMBAR DISC SURGERY  2010; 2011   "2 ruptured discs; 2 pinched nerves"    There were no vitals filed for this visit.      Subjective Assessment - 05/22/17 0920    Subjective Doing better today with decreased soreness in RT shldr  3/10   Pertinent History RT shldr sx 02-07-17   How long can you sit comfortably? unlimied   How long can you stand comfortably?  unlimited   How long can you walk comfortably? unlimited   Currently in Pain? Yes   Pain Score 2    Pain Location Shoulder   Pain Orientation Right   Pain Descriptors / Indicators Sore   Pain Type Surgical pain   Pain Onset More than a month ago   Pain Frequency Intermittent                         [facility] Adult PT Treatment/Exercise - 05/22/17 0001      Exercises   Exercises Shoulder;Elbow     Elbow Exercises   Elbow Flexion Strengthening   Bar Weights/Barbell (Elbow Flexion) 5 lbs  3x20     Shoulder Exercises: Prone   Retraction Strengthening;Right  2x30   Retraction Weight (lbs) 5   Extension Strengthening;Right;Weights  2x30   Extension Weight (lbs) 5     Shoulder Exercises: Standing   Flexion Strengthening;Right  6 x10 to 90 degrees with good technique   Shoulder Flexion Weight (lbs) 3   Other Standing Exercises PNF patterns RT UE 4# ball 2x10,  6# ball elevation Bil UEs 2x10     Shoulder Exercises: Pulleys   Flexion Limitations 5 minutes.     Shoulder Exercises: ROM/Strengthening   UBE (Upper Arm Bike) 60 RPMs x 6 mins     Modalities  Modalities Electrical Stimulation;Moist Engineer, manufacturing systems Location Right shoulder.  IFC x 15 mins 80-150hz    Electrical Stimulation Goals Pain     Vasopneumatic   Number Minutes Vasopneumatic  15 minutes   Vasopnuematic Location  Shoulder   Vasopneumatic Pressure Low   Vasopneumatic Temperature  36                  PT Short Term Goals - 04/08/17 0805      PT SHORT TERM GOAL #1   Title Pt will be independent in his HEP.   Time 3   Period Weeks   Status Achieved           PT Long Term Goals - 05/02/17 1610      PT LONG TERM GOAL #1   Title Patient will be independent in his HEP and progression.    Time 8   Period Weeks   Status Achieved     PT LONG TERM GOAL #2   Title Patient will improve his FOTO score from 52% limiitation to >/= 34%  limitation   Time 8   Period Weeks   Status On-going     PT LONG TERM GOAL #3   Title Patient will be able to perform Right shoulder flexion to >/= 140 degrees with pain less than 3/10.    Time 8   Period Weeks   Status On-going     PT LONG TERM GOAL #4   Title Patient will be able to perform ADL's with no pain.   Time 8   Period Weeks   Status On-going     PT LONG TERM GOAL #5   Title Patient will increase his R shoulder flexion and abduction strength to >/= 4/5 in order to improve functional mobility.   Time 8   Period Weeks   Status On-going               Plan - 05/22/17 9604    Clinical Impression Statement Pt arrived to clinic today feeling better with less soreness in RT shldr. He was able to continue with PREs and increase reps. He did better with elevation today and is close to meeting LTG of 140 degrees. 135 degrees today   Rehab Potential Excellent   Clinical Impairments Affecting Rehab Potential surgery 02/07/17 and current 11 weeks 04/25/17   PT Frequency 2x / week   PT Duration 8 weeks   PT Treatment/Interventions ADLs/Self Care Home Management;Cryotherapy;Electrical Stimulation;Ultrasound;Neuromuscular re-education;Therapeutic exercise;Therapeutic activities;Functional mobility training;Patient/family education;Manual techniques;Passive range of motion;Dry needling;Taping;Vasopneumatic Device   PT Next Visit Plan Continue with AROM exercises per protocol. (MD. Case 04/24/17)  New Order from MD to start Bands. See new Order   PT Home Exercise Plan Pendulums, cane flexion and ER   Consulted and Agree with Plan of Care Patient      Patient will benefit from skilled therapeutic intervention in order to improve the following deficits and impairments:  Postural dysfunction, Decreased range of motion, Impaired UE functional use, Pain, Decreased strength, Decreased activity tolerance  Visit Diagnosis: Acute pain of right shoulder  Localized edema  Stiffness of  right shoulder, not elsewhere classified  Shoulder weakness     Problem List Patient Active Problem List   Diagnosis Date Noted  . Guaiac positive stools 10/24/2016  . Precordial pain 12/30/2012  . HTN (hypertension)   . Diabetes mellitus type 2, noninsulin dependent (HCC)   . Hyperlipidemia   . Morbid obesity (  HCC)   . CKD (chronic kidney disease) stage 3, GFR 30-59 ml/min   . Anginal pain (HCC) 12/29/2012    Kirill Chatterjee,[name], PTA 05/22/2017, 10:32 AM  [facility] [address] [place], [place], [address] Phone: [phone]   Fax:  [phone]  Name: KHAM ZUCKERMAN MRN: [id number] Date of Birth: Oct 09, 1961

## 2017-05-28 ENCOUNTER — Ambulatory Visit: Payer: Medicare Other | Admitting: Physical Therapy

## 2017-05-28 ENCOUNTER — Encounter: Payer: Self-pay | Admitting: Physical Therapy

## 2017-05-28 DIAGNOSIS — M25511 Pain in right shoulder: Secondary | ICD-10-CM

## 2017-05-28 DIAGNOSIS — M25611 Stiffness of right shoulder, not elsewhere classified: Secondary | ICD-10-CM

## 2017-05-28 DIAGNOSIS — R29898 Other symptoms and signs involving the musculoskeletal system: Secondary | ICD-10-CM

## 2017-05-28 DIAGNOSIS — R600 Localized edema: Secondary | ICD-10-CM

## 2017-05-28 NOTE — Therapy (Signed)
[facility] [address] [place], [place], [address] Phone: [phone]   Fax:  [phone]  Physical Therapy Treatment  Patient Details  Name: [name] MRN: [id number] Date of Birth: 07/03/1961 Referring Provider: Dr. Viviann Spare Case  Encounter Date: 05/28/2017      PT End of Session - 05/28/17 0941    Visit Number 27   Number of Visits 30   Date for PT Re-Evaluation 06/05/17   Authorization Type UHC/Medicare      MD 06-05-17   PT Start Time 0906   PT Stop Time 0956   PT Time Calculation (min) 50 min   Activity Tolerance Patient tolerated treatment well   Behavior During Therapy [facility] for tasks assessed/performed      Past Medical History:  Diagnosis Date  . Anginal pain (HCC) 12/29/2012  . Chronic low back pain   . CKD (chronic kidney disease) stage 3, GFR 30-59 ml/min   . Diabetes mellitus type 2, noninsulin dependent (HCC)   . Exertional shortness of breath   . GERD (gastroesophageal reflux disease)   . H/O hiatal hernia   . Headache(784.0)    "not daily, not weekly, but very often" (12/30/2012)  . HTN (hypertension)   . Hyperlipidemia   . Morbid obesity (HCC)   . Osteoarthritis    "hands" (12/30/2012)    Past Surgical History:  Procedure Laterality Date  . ELBOW ARTHROPLASTY Left 1990's   "3 spurs; ?14 staples" (12/30/2012)  . LEFT HEART CATHETERIZATION WITH CORONARY ANGIOGRAM N/A 12/31/2012   Procedure: LEFT HEART CATHETERIZATION WITH CORONARY ANGIOGRAM;  Surgeon: Tonny Bollman, MD;  Location: [facility] CATH LAB;  Service: Cardiovascular;  Laterality: N/A;  . LUMBAR DISC SURGERY  2010; 2011   "2 ruptured discs; 2 pinched nerves"    There were no vitals filed for this visit.      Subjective Assessment - 05/28/17 0909    Subjective no pain today! states he is doing "better"   Pertinent History RT shldr sx 02-07-17   Currently in Pain? No/denies   Pain Score 0-No pain                         [facility]  Adult PT Treatment/Exercise - 05/28/17 0001      Shoulder Exercises: Supine   Protraction Strengthening;Right;20 reps   Horizontal ABduction Strengthening;20 reps;Theraband   Theraband Level (Shoulder Horizontal ABduction) Level 3 (Green)     Shoulder Exercises: Prone   Retraction Strengthening;Right;20 reps   Retraction Weight (lbs) 5   Extension Strengthening;Right;20 reps   Extension Weight (lbs) 5     Shoulder Exercises: Standing   Flexion Strengthening;Right;20 reps  to 90 degrees with cues for good form   Shoulder Flexion Weight (lbs) 5   ABduction Strengthening;Right;20 reps;Weights   Shoulder ABduction Weight (lbs) 3   Other Standing Exercises PNF patterns 6#  x 10 D1 and D2     Shoulder Exercises: ROM/Strengthening   UBE (Upper Arm Bike) 6 mins 60 RPMs     Electrical Stimulation   Electrical Stimulation Location Rt shoulder   Electrical Stimulation Goals Pain     Vasopneumatic   Number Minutes Vasopneumatic  15 minutes   Vasopnuematic Location  Shoulder   Vasopneumatic Pressure Low   Vasopneumatic Temperature  36     Manual Therapy   Passive ROM Rt shoulder ER to tolerance with holds at end range  PT Short Term Goals - 04/08/17 0805      PT SHORT TERM GOAL #1   Title Pt will be independent in his HEP.   Time 3   Period Weeks   Status Achieved           PT Long Term Goals - 05/28/17 0943      PT LONG TERM GOAL #1   Title Patient will be independent in his HEP and progression.    Status Achieved     PT LONG TERM GOAL #2   Title Patient will improve his FOTO score from 52% limiitation to >/= 34% limitation   Status On-going     PT LONG TERM GOAL #3   Title Patient will be able to perform Right shoulder flexion to >/= 140 degrees with pain less than 3/10.    Status Achieved     PT LONG TERM GOAL #4   Title Patient will be able to perform ADL's with no pain.   Status On-going     PT LONG TERM GOAL #5   Title Patient  will increase his R shoulder flexion and abduction strength to >/= 4/5 in order to improve functional mobility.   Status On-going               Plan - 05/28/17 0941    Clinical Impression Statement Pt progressing well with decreased pain.  Still with decreased ROM and able to add scapular exercises without pain   Rehab Potential Excellent   Clinical Impairments Affecting Rehab Potential surgery 02/07/17 and current 15 weeks   PT Frequency 2x / week   PT Duration 8 weeks   PT Treatment/Interventions ADLs/Self Care Home Management;Cryotherapy;Electrical Stimulation;Ultrasound;Neuromuscular re-education;Therapeutic exercise;Therapeutic activities;Functional mobility training;Patient/family education;Manual techniques;Passive range of motion;Dry needling;Taping;Vasopneumatic Device   PT Next Visit Plan Continue with AROM exercises per protocol. progress to closed chain exercises.  MD visit on 8/23 - send note   Consulted and Agree with Plan of Care Patient      Patient will benefit from skilled therapeutic intervention in order to improve the following deficits and impairments:  Postural dysfunction, Decreased range of motion, Impaired UE functional use, Pain, Decreased strength, Decreased activity tolerance  Visit Diagnosis: Acute pain of right shoulder  Localized edema  Stiffness of right shoulder, not elsewhere classified  Shoulder weakness     Problem List Patient Active Problem List   Diagnosis Date Noted  . Guaiac positive stools 10/24/2016  . Precordial pain 12/30/2012  . HTN (hypertension)   . Diabetes mellitus type 2, noninsulin dependent (HCC)   . Hyperlipidemia   . Morbid obesity (HCC)   . CKD (chronic kidney disease) stage 3, GFR 30-59 ml/min   . Anginal pain (HCC) 12/29/2012    Reggy EyeKaren Quincey Strickland, PT, DPT 05/28/2017, 9:44 AM  [facility] [address] [place], [place], [address] Phone: [phone]   Fax:   [phone]  Name: [name] MRN: [id number] Date of Birth: December 19, 1960

## 2017-05-29 ENCOUNTER — Ambulatory Visit: Payer: Medicare Other | Admitting: Physical Therapy

## 2017-05-29 ENCOUNTER — Encounter: Payer: Self-pay | Admitting: Physical Therapy

## 2017-05-29 DIAGNOSIS — R29898 Other symptoms and signs involving the musculoskeletal system: Secondary | ICD-10-CM

## 2017-05-29 DIAGNOSIS — M25511 Pain in right shoulder: Secondary | ICD-10-CM | POA: Diagnosis not present

## 2017-05-29 DIAGNOSIS — M25611 Stiffness of right shoulder, not elsewhere classified: Secondary | ICD-10-CM

## 2017-05-29 DIAGNOSIS — R600 Localized edema: Secondary | ICD-10-CM

## 2017-05-29 NOTE — Therapy (Signed)
[facility] [place], [place], [address] Phone: [phone]   Fax:  [phone]  Physical Therapy Treatment  Patient Details  Name: DORON SHAKE MRN: [id number] Date of Birth: Sep 06, 1961 Referring Provider: Dr. Remo Lipps Case  Encounter Date: 05/29/2017      PT End of Session - 05/29/17 1416    Visit Number 28   Number of Visits 30   Date for PT Re-Evaluation 06/05/17   Authorization Type UHC/Medicare      MD 06-05-17   PT Start Time 1345   PT Stop Time 1431   PT Time Calculation (min) 46 min   Activity Tolerance Patient tolerated treatment well   Behavior During Therapy [facility] for tasks assessed/performed      Past Medical History:  Diagnosis Date  . Anginal pain ([place]) 12/29/2012  . Chronic low back pain   . CKD (chronic kidney disease) stage 3, GFR 30-59 ml/min   . Diabetes mellitus type 2, noninsulin dependent ([place])   . Exertional shortness of breath   . GERD (gastroesophageal reflux disease)   . H/O hiatal hernia   . Headache(784.0)    "not daily, not weekly, but very often" (12/30/2012)  . HTN (hypertension)   . Hyperlipidemia   . Morbid obesity ([place])   . Osteoarthritis    "hands" (12/30/2012)    Past Surgical History:  Procedure Laterality Date  . ELBOW ARTHROPLASTY Left 1990's   "3 spurs; ?14 staples" (12/30/2012)  . LEFT HEART CATHETERIZATION WITH CORONARY ANGIOGRAM N/A 12/31/2012   Procedure: LEFT HEART CATHETERIZATION WITH CORONARY ANGIOGRAM;  Surgeon: Sherren Mocha, MD;  Location: [facility] CATH LAB;  Service: Cardiovascular;  Laterality: N/A;  . LUMBAR [place] SURGERY  2010; 2011   "2 ruptured discs; 2 pinched nerves"    There were no vitals filed for this visit.      Subjective Assessment - 05/29/17 1357    Subjective no pain today! states he is doing "better"   Pertinent History RT shldr sx 02-07-17   How long can you sit comfortably? unlimied   How long can you stand comfortably? unlimited   How  long can you walk comfortably? unlimited   Currently in Pain? No/denies                         [facility] Adult PT Treatment/Exercise - 05/29/17 0001      Elbow Exercises   Elbow Flexion Strengthening   Bar Weights/Barbell (Elbow Flexion) 5 lbs     Shoulder Exercises: Prone   Retraction Strengthening;Right;Weights  3x10   Retraction Weight (lbs) 5   Extension Strengthening;Weights  3x10   Extension Weight (lbs) 5     Shoulder Exercises: Standing   Flexion Strengthening;Right;20 reps  90 degrees 3x10   Shoulder Flexion Weight (lbs) 5   ABduction Strengthening;Right;20 reps;Weights   Shoulder ABduction Weight (lbs) 3   Other Standing Exercises PNF 4# 2x10     Shoulder Exercises: Pulleys   Flexion Limitations 5 minutes.     Shoulder Exercises: ROM/Strengthening   UBE (Upper Arm Bike) 6 mins 60 RPMs     Electrical Stimulation   Electrical Stimulation Location Rt shoulder   Electrical Stimulation Action IFC   Electrical Stimulation Parameters 1-10 x51mn   Electrical Stimulation Goals Pain     Vasopneumatic   Number Minutes Vasopneumatic  15 minutes   Vasopnuematic Location  Shoulder   Vasopneumatic Pressure Low  PT Short Term Goals - 04/08/17 0805      PT SHORT TERM GOAL #1   Title Pt will be independent in his HEP.   Time 3   Period Weeks   Status Achieved           PT Long Term Goals - 05/29/17 1402      PT LONG TERM GOAL #1   Title Patient will be independent in his HEP and progression.    Time 8   Period Weeks   Status Achieved     PT LONG TERM GOAL #2   Title Patient will improve his FOTO score from 52% limiitation to >/= 34% limitation   Time 8   Period Weeks   Status On-going     PT LONG TERM GOAL #3   Title Patient will be able to perform Right shoulder flexion to >/= 140 degrees with pain less than 3/10.    Time 8   Period Weeks   Status Achieved     PT LONG TERM GOAL #4   Title Patient will be  able to perform ADL's with no pain.   Time 8   Period Weeks   Status Achieved     PT LONG TERM GOAL #5   Title Patient will increase his R shoulder flexion and abduction strength to >/= 4/5 in order to improve functional mobility.   Time 8   Period Weeks   Status On-going               Plan - 05/29/17 1417    Clinical Impression Statement Patient continues to progress with no pain reported. Patient progressing with right shoulder strengthening. Patient has reported being able to complete all ADL's with no difficulty. Patient has MD appt next week. Patient is ready to DC if MD agrees. Patient met LTG #4 others ongoing.    Rehab Potential Excellent   PT Frequency 2x / week   PT Duration 8 weeks   PT Treatment/Interventions ADLs/Self Care Home Management;Cryotherapy;Electrical Stimulation;Ultrasound;Neuromuscular re-education;Therapeutic exercise;Therapeutic activities;Functional mobility training;Patient/family education;Manual techniques;Passive range of motion;Dry needling;Taping;Vasopneumatic Device   PT Next Visit Plan Continue with AROM exercises per protocol. progress to closed chain exercises.  MD visit on 8/23 - send note   Consulted and Agree with Plan of Care Patient      Patient will benefit from skilled therapeutic intervention in order to improve the following deficits and impairments:  Postural dysfunction, Decreased range of motion, Impaired UE functional use, Pain, Decreased strength, Decreased activity tolerance  Visit Diagnosis: Acute pain of right shoulder  Localized edema  Stiffness of right shoulder, not elsewhere classified  Shoulder weakness     Problem List Patient Active Problem List   Diagnosis Date Noted  . Guaiac positive stools 10/24/2016  . Precordial pain 12/30/2012  . HTN (hypertension)   . Diabetes mellitus type 2, noninsulin dependent ([place])   . Hyperlipidemia   . Morbid obesity ([place])   . CKD (chronic kidney disease) stage 3, GFR  30-59 ml/min   . Anginal pain ([place]) 12/29/2012    Lenor Provencher P, PTA 05/29/2017, 2:35 PM  [facility] [place], [place], [address] Phone: [phone]   Fax:  [phone]  Name: CAPRICE WASKO MRN: [id number] Date of Birth: 1961-08-25

## 2017-06-04 ENCOUNTER — Ambulatory Visit: Payer: Medicare Other | Admitting: Physical Therapy

## 2017-06-04 ENCOUNTER — Encounter: Payer: Self-pay | Admitting: Physical Therapy

## 2017-06-04 DIAGNOSIS — R600 Localized edema: Secondary | ICD-10-CM

## 2017-06-04 DIAGNOSIS — M25511 Pain in right shoulder: Secondary | ICD-10-CM | POA: Diagnosis not present

## 2017-06-04 DIAGNOSIS — M25611 Stiffness of right shoulder, not elsewhere classified: Secondary | ICD-10-CM

## 2017-06-04 DIAGNOSIS — R29898 Other symptoms and signs involving the musculoskeletal system: Secondary | ICD-10-CM

## 2017-06-04 NOTE — Therapy (Addendum)
[facility] [place], [place], [address] Phone: [phone]   Fax:  [phone]  Physical Therapy Treatment  Patient Details  Name: [name] MRN: [id number] Date of Birth: 04-03-1961 Referring Provider: Dr. Remo Lipps Case  Encounter Date: 06/04/2017      PT End of Session - 06/04/17 0736    Visit Number 29   Number of Visits 30   Date for PT Re-Evaluation 06/05/17   Authorization Type UHC/Medicare      MD 06-05-17   PT Start Time 0729   PT Stop Time 0817   PT Time Calculation (min) 48 min   Activity Tolerance Patient tolerated treatment well   Behavior During Therapy [facility] for tasks assessed/performed      Past Medical History:  Diagnosis Date  . Anginal pain ([place]) 12/29/2012  . Chronic low back pain   . CKD (chronic kidney disease) stage 3, GFR 30-59 ml/min   . Diabetes mellitus type 2, noninsulin dependent ([place])   . Exertional shortness of breath   . GERD (gastroesophageal reflux disease)   . H/O hiatal hernia   . Headache(784.0)    "not daily, not weekly, but very often" (12/30/2012)  . HTN (hypertension)   . Hyperlipidemia   . Morbid obesity ([place])   . Osteoarthritis    "hands" (12/30/2012)    Past Surgical History:  Procedure Laterality Date  . ELBOW ARTHROPLASTY Left 1990's   "3 spurs; ?14 staples" (12/30/2012)  . LEFT HEART CATHETERIZATION WITH CORONARY ANGIOGRAM N/A 12/31/2012   Procedure: LEFT HEART CATHETERIZATION WITH CORONARY ANGIOGRAM;  Surgeon: Sherren Mocha, MD;  Location: [facility] CATH LAB;  Service: Cardiovascular;  Laterality: N/A;  . LUMBAR [place] SURGERY  2010; 2011   "2 ruptured discs; 2 pinched nerves"    There were no vitals filed for this visit.      Subjective Assessment - 06/04/17 0730    Subjective No pain reported and doing "great"   Pertinent History RT shldr sx 02-07-17   How long can you sit comfortably? unlimied   How long can you stand comfortably? unlimited   How long can  you walk comfortably? unlimited   Currently in Pain? No/denies            [facility] PT Assessment - 06/04/17 0001      PROM   PROM Assessment Site Shoulder   Right/Left Shoulder Right   Right Shoulder Flexion 150 Degrees   Right Shoulder External Rotation 60 Degrees     Strength   Right/Left Shoulder Right   Right Shoulder Flexion 4+/5   Right Shoulder Extension 4+/5   Right Shoulder ABduction 4/5   Right Shoulder Internal Rotation 4/5   Right Shoulder External Rotation 4/5                     [facility] Adult PT Treatment/Exercise - 06/04/17 0001      Elbow Exercises   Elbow Flexion Strengthening   Bar Weights/Barbell (Elbow Flexion) 5 lbs     Shoulder Exercises: Prone   Retraction Strengthening;Right;Weights  3x10   Retraction Weight (lbs) 5   Extension Strengthening;Weights  3x10   Extension Weight (lbs) 5     Shoulder Exercises: Standing   Flexion Strengthening;Right;Weights  3x10   Shoulder Flexion Weight (lbs) 5   ABduction Strengthening;Right;20 reps;Weights   Shoulder ABduction Weight (lbs) 3   Other Standing Exercises PNF 4# 2x10     Shoulder Exercises: Pulleys   Flexion Limitations 5 minutes.  Shoulder Exercises: ROM/Strengthening   UBE (Upper Arm Bike) 6 mins 60 RPMs     Electrical Stimulation   Electrical Stimulation Location Rt shoulder   Electrical Stimulation Action IFC   Electrical Stimulation Parameters 1-'10hz'  x40mn   Electrical Stimulation Goals Pain     Vasopneumatic   Number Minutes Vasopneumatic  15 minutes   Vasopnuematic Location  Shoulder   Vasopneumatic Pressure Low                  PT Short Term Goals - 04/08/17 0805      PT SHORT TERM GOAL #1   Title Pt will be independent in his HEP.   Time 3   Period Weeks   Status Achieved           PT Long Term Goals - 06/04/17 0737      PT LONG TERM GOAL #1   Title Patient will be independent in his HEP and progression.    Time 8   Period Weeks    Status Achieved     PT LONG TERM GOAL #2   Title Patient will improve his FOTO score from 52% limiitation to >/= 34% limitation   Time 8   Period Weeks   Status Achieved  28% limitation 06/04/17     PT LONG TERM GOAL #3   Title Patient will be able to perform Right shoulder flexion to >/= 140 degrees with pain less than 3/10.    Time 8   Period Weeks   Status Achieved     PT LONG TERM GOAL #4   Title Patient will be able to perform ADL's with no pain.   Time 8   Period Weeks   Status Achieved     PT LONG TERM GOAL #5   Title Patient will increase his R shoulder flexion and abduction strength to >/= 4/5 in order to improve functional mobility.   Time 8   Period Weeks   Status Achieved  06/04/17               Plan - 06/04/17 0745    Clinical Impression Statement Patient tolerated treatment well today. Patient has met all current goals today. AROM WNL and strength withing goal. Patient improved FOTO today 28% limitation (initial 52%)    Rehab Potential Excellent   PT Frequency 2x / week   PT Duration 8 weeks   PT Treatment/Interventions ADLs/Self Care Home Management;Cryotherapy;Electrical Stimulation;Ultrasound;Neuromuscular re-education;Therapeutic exercise;Therapeutic activities;Functional mobility training;Patient/family education;Manual techniques;Passive range of motion;Dry needling;Taping;Vasopneumatic Device   PT Next Visit Plan MD. Case 06/05/17   Consulted and Agree with Plan of Care Patient      Patient will benefit from skilled therapeutic intervention in order to improve the following deficits and impairments:  Postural dysfunction, Decreased range of motion, Impaired UE functional use, Pain, Decreased strength, Decreased activity tolerance  Visit Diagnosis: Acute pain of right shoulder  Localized edema  Stiffness of right shoulder, not elsewhere classified  Shoulder weakness     Problem List Patient Active Problem List   Diagnosis Date Noted  .  Guaiac positive stools 10/24/2016  . Precordial pain 12/30/2012  . HTN (hypertension)   . Diabetes mellitus type 2, noninsulin dependent ([place]   . Hyperlipidemia   . [place]bid [place] ([place]   . CKD (chronic kidney disease) stage 3, GFR 30-59 ml/min   . [place]inal pain ([place] 12/29/2012    CLadean Raya PTA 06/04/17 2:04 PM CMaliApplegate MPT [facility] [address]  [place], [place], [address] Phone: [phone]   Fax:  [phone]  Name: [name] MRN: [id number] Date of Birth: 06-04-1961  PHYSICAL THERAPY DISCHARGE SUMMARY  Visits from Start of Care: 29.  Current functional level related to goals / functional outcomes: See above.   Remaining deficits: All goals achieved.   Education / Equipment: HEP. Plan: Patient agrees to discharge.  Patient goals were met. Patient is being discharged due to meeting the stated rehab goals.  ?????        Mali Applegate MPT

## 2017-12-16 ENCOUNTER — Other Ambulatory Visit: Payer: Self-pay | Admitting: Orthopedic Surgery

## 2017-12-17 ENCOUNTER — Other Ambulatory Visit: Payer: Self-pay | Admitting: Orthopedic Surgery

## 2018-01-05 ENCOUNTER — Encounter (HOSPITAL_BASED_OUTPATIENT_CLINIC_OR_DEPARTMENT_OTHER): Payer: Self-pay | Admitting: *Deleted

## 2018-01-05 ENCOUNTER — Other Ambulatory Visit: Payer: Self-pay

## 2018-01-05 NOTE — Progress Notes (Signed)
   01/05/18 1553  OBSTRUCTIVE SLEEP APNEA  Have you ever been diagnosed with sleep apnea through a sleep study? No  Do you snore loudly (loud enough to be heard through closed doors)?  0  Do you often feel tired, fatigued, or sleepy during the daytime (such as falling asleep during driving or talking to someone)? 0  Has anyone observed you stop breathing during your sleep? 0  Do you have, or are you being treated for high blood pressure? 1  BMI more than 35 kg/m2? 1  Age > 50 (1-yes) 1  Neck circumference greater than:Male 16 inches or larger, Male 17inches or larger? 0  Male Gender (Yes=1) 1  Obstructive Sleep Apnea Score 4

## 2018-01-06 ENCOUNTER — Encounter (HOSPITAL_BASED_OUTPATIENT_CLINIC_OR_DEPARTMENT_OTHER)
Admission: RE | Admit: 2018-01-06 | Discharge: 2018-01-06 | Disposition: A | Discharge status: Home / Self Care | Payer: 59 | Source: Ambulatory Visit | Attending: Orthopedic Surgery | Admitting: Orthopedic Surgery

## 2018-01-06 DIAGNOSIS — M1990 Unspecified osteoarthritis, unspecified site: Secondary | ICD-10-CM | POA: Diagnosis not present

## 2018-01-06 DIAGNOSIS — G5602 Carpal tunnel syndrome, left upper limb: Secondary | ICD-10-CM | POA: Diagnosis present

## 2018-01-06 DIAGNOSIS — E785 Hyperlipidemia, unspecified: Secondary | ICD-10-CM | POA: Diagnosis not present

## 2018-01-06 DIAGNOSIS — G5622 Lesion of ulnar nerve, left upper limb: Secondary | ICD-10-CM | POA: Diagnosis not present

## 2018-01-06 DIAGNOSIS — I129 Hypertensive chronic kidney disease with stage 1 through stage 4 chronic kidney disease, or unspecified chronic kidney disease: Secondary | ICD-10-CM | POA: Diagnosis not present

## 2018-01-06 DIAGNOSIS — E1122 Type 2 diabetes mellitus with diabetic chronic kidney disease: Secondary | ICD-10-CM | POA: Diagnosis not present

## 2018-01-06 DIAGNOSIS — Z888 Allergy status to other drugs, medicaments and biological substances status: Secondary | ICD-10-CM | POA: Diagnosis not present

## 2018-01-06 DIAGNOSIS — N183 Chronic kidney disease, stage 3 (moderate): Secondary | ICD-10-CM | POA: Diagnosis not present

## 2018-01-06 DIAGNOSIS — Z79899 Other long term (current) drug therapy: Secondary | ICD-10-CM | POA: Diagnosis not present

## 2018-01-06 DIAGNOSIS — Z794 Long term (current) use of insulin: Secondary | ICD-10-CM | POA: Diagnosis not present

## 2018-01-06 DIAGNOSIS — Z87891 Personal history of nicotine dependence: Secondary | ICD-10-CM | POA: Diagnosis not present

## 2018-01-06 DIAGNOSIS — Z7982 Long term (current) use of aspirin: Secondary | ICD-10-CM | POA: Diagnosis not present

## 2018-01-06 DIAGNOSIS — K219 Gastro-esophageal reflux disease without esophagitis: Secondary | ICD-10-CM | POA: Diagnosis not present

## 2018-01-06 DIAGNOSIS — Z6841 Body Mass Index (BMI) 40.0 and over, adult: Secondary | ICD-10-CM | POA: Diagnosis not present

## 2018-01-06 LAB — BASIC METABOLIC PANEL
ANION GAP: 9 (ref 5–15)
BUN: 48 mg/dL — AB (ref 6–20)
CO2: 26 mmol/L (ref 22–32)
Calcium: 9.4 mg/dL (ref 8.9–10.3)
Chloride: 101 mmol/L (ref 101–111)
Creatinine, Ser: 2.11 mg/dL — ABNORMAL HIGH (ref 0.61–1.24)
GFR, EST AFRICAN AMERICAN: 38 mL/min — AB (ref >60)
GFR, EST NON AFRICAN AMERICAN: 33 mL/min — AB (ref >60)
Glucose, Bld: 170 mg/dL — ABNORMAL HIGH (ref 65–99)
Potassium: 5.1 mmol/L (ref 3.5–5.1)
SODIUM: 136 mmol/L (ref 135–145)

## 2018-01-06 NOTE — Progress Notes (Signed)
Anesthesia consult per Dr. Turk, will proceed with surgery as scheduled.  

## 2018-01-06 NOTE — Progress Notes (Signed)
Lab results reviewed by Dr. Turk, will proceed with surgery as scheduled. 

## 2018-01-08 ENCOUNTER — Ambulatory Visit (HOSPITAL_BASED_OUTPATIENT_CLINIC_OR_DEPARTMENT_OTHER): Payer: 59 | Admitting: Anesthesiology

## 2018-01-08 ENCOUNTER — Ambulatory Visit (HOSPITAL_BASED_OUTPATIENT_CLINIC_OR_DEPARTMENT_OTHER)
Admission: RE | Admit: 2018-01-08 | Discharge: 2018-01-08 | Disposition: A | Discharge status: Home / Self Care | Payer: 59 | Source: Ambulatory Visit | Attending: Orthopedic Surgery | Admitting: Orthopedic Surgery

## 2018-01-08 ENCOUNTER — Encounter (HOSPITAL_BASED_OUTPATIENT_CLINIC_OR_DEPARTMENT_OTHER)
Admission: RE | Disposition: A | Discharge status: Home / Self Care | Payer: Self-pay | Source: Ambulatory Visit | Attending: Orthopedic Surgery

## 2018-01-08 ENCOUNTER — Encounter (HOSPITAL_BASED_OUTPATIENT_CLINIC_OR_DEPARTMENT_OTHER): Payer: Self-pay | Admitting: Anesthesiology

## 2018-01-08 DIAGNOSIS — Z87891 Personal history of nicotine dependence: Secondary | ICD-10-CM | POA: Insufficient documentation

## 2018-01-08 DIAGNOSIS — G5622 Lesion of ulnar nerve, left upper limb: Secondary | ICD-10-CM | POA: Insufficient documentation

## 2018-01-08 DIAGNOSIS — E1122 Type 2 diabetes mellitus with diabetic chronic kidney disease: Secondary | ICD-10-CM | POA: Insufficient documentation

## 2018-01-08 DIAGNOSIS — N183 Chronic kidney disease, stage 3 (moderate): Secondary | ICD-10-CM | POA: Insufficient documentation

## 2018-01-08 DIAGNOSIS — I129 Hypertensive chronic kidney disease with stage 1 through stage 4 chronic kidney disease, or unspecified chronic kidney disease: Secondary | ICD-10-CM | POA: Insufficient documentation

## 2018-01-08 DIAGNOSIS — Z888 Allergy status to other drugs, medicaments and biological substances status: Secondary | ICD-10-CM | POA: Insufficient documentation

## 2018-01-08 DIAGNOSIS — Z794 Long term (current) use of insulin: Secondary | ICD-10-CM | POA: Insufficient documentation

## 2018-01-08 DIAGNOSIS — G5602 Carpal tunnel syndrome, left upper limb: Secondary | ICD-10-CM | POA: Insufficient documentation

## 2018-01-08 DIAGNOSIS — Z6841 Body Mass Index (BMI) 40.0 and over, adult: Secondary | ICD-10-CM | POA: Insufficient documentation

## 2018-01-08 DIAGNOSIS — M1990 Unspecified osteoarthritis, unspecified site: Secondary | ICD-10-CM | POA: Insufficient documentation

## 2018-01-08 DIAGNOSIS — Z7982 Long term (current) use of aspirin: Secondary | ICD-10-CM | POA: Insufficient documentation

## 2018-01-08 DIAGNOSIS — Z79899 Other long term (current) drug therapy: Secondary | ICD-10-CM | POA: Insufficient documentation

## 2018-01-08 DIAGNOSIS — K219 Gastro-esophageal reflux disease without esophagitis: Secondary | ICD-10-CM | POA: Insufficient documentation

## 2018-01-08 DIAGNOSIS — E785 Hyperlipidemia, unspecified: Secondary | ICD-10-CM | POA: Insufficient documentation

## 2018-01-08 HISTORY — PX: ULNAR NERVE TRANSPOSITION: SHX2595

## 2018-01-08 HISTORY — PX: CARPAL TUNNEL RELEASE: SHX101

## 2018-01-08 LAB — GLUCOSE, CAPILLARY
GLUCOSE-CAPILLARY: 121 mg/dL — AB (ref 65–99)
Glucose-Capillary: 142 mg/dL — ABNORMAL HIGH (ref 65–99)

## 2018-01-08 SURGERY — CARPAL TUNNEL RELEASE
Anesthesia: General | Site: Wrist | Laterality: Left

## 2018-01-08 MED ORDER — ONDANSETRON HCL 4 MG/2ML IJ SOLN
INTRAMUSCULAR | Status: AC
  Filled 2018-01-08: qty 2

## 2018-01-08 MED ORDER — DEXTROSE 5 % IV SOLN
INTRAVENOUS | Status: DC | PRN
  Administered 2018-01-08: 3 g via INTRAVENOUS

## 2018-01-08 MED ORDER — DEXAMETHASONE SODIUM PHOSPHATE 4 MG/ML IJ SOLN
INTRAMUSCULAR | Status: DC | PRN
  Administered 2018-01-08: 10 mg via INTRAVENOUS

## 2018-01-08 MED ORDER — FENTANYL CITRATE (PF) 100 MCG/2ML IJ SOLN
INTRAMUSCULAR | Status: AC
  Filled 2018-01-08: qty 2

## 2018-01-08 MED ORDER — PROMETHAZINE HCL 25 MG/ML IJ SOLN: 6.2500 mg | INTRAMUSCULAR | Status: DC | PRN

## 2018-01-08 MED ORDER — MIDAZOLAM HCL 5 MG/5ML IJ SOLN
INTRAMUSCULAR | Status: DC | PRN
  Administered 2018-01-08 (×2): 1 mg via INTRAVENOUS

## 2018-01-08 MED ORDER — DEXAMETHASONE SODIUM PHOSPHATE 10 MG/ML IJ SOLN
INTRAMUSCULAR | Status: AC
  Filled 2018-01-08: qty 1

## 2018-01-08 MED ORDER — LIDOCAINE HCL (CARDIAC) 20 MG/ML IV SOLN
INTRAVENOUS | Status: AC
  Filled 2018-01-08: qty 5

## 2018-01-08 MED ORDER — MIDAZOLAM HCL 2 MG/2ML IJ SOLN
1.0000 mg | INTRAMUSCULAR | Status: DC | PRN
  Administered 2018-01-08: 2 mg via INTRAVENOUS

## 2018-01-08 MED ORDER — ROPIVACAINE HCL 5 MG/ML IJ SOLN
INTRAMUSCULAR | Status: DC | PRN
  Administered 2018-01-08: 30 mL via PERINEURAL

## 2018-01-08 MED ORDER — MIDAZOLAM HCL 2 MG/2ML IJ SOLN
INTRAMUSCULAR | Status: AC
Start: 2018-01-08 — End: 2018-01-08
  Filled 2018-01-08: qty 2

## 2018-01-08 MED ORDER — PHENYLEPHRINE HCL 10 MG/ML IJ SOLN
INTRAMUSCULAR | Status: DC | PRN
  Administered 2018-01-08: 80 ug via INTRAVENOUS

## 2018-01-08 MED ORDER — SCOPOLAMINE 1 MG/3DAYS TD PT72: 1.0000 | MEDICATED_PATCH | Freq: Once | TRANSDERMAL | Status: DC | PRN

## 2018-01-08 MED ORDER — LACTATED RINGERS IV SOLN
INTRAVENOUS | Status: DC | PRN
  Administered 2018-01-08 (×2): via INTRAVENOUS

## 2018-01-08 MED ORDER — PROPOFOL 10 MG/ML IV BOLUS
INTRAVENOUS | Status: AC
  Filled 2018-01-08: qty 20

## 2018-01-08 MED ORDER — ONDANSETRON HCL 4 MG/2ML IJ SOLN
INTRAMUSCULAR | Status: DC | PRN
  Administered 2018-01-08: 4 mg via INTRAVENOUS

## 2018-01-08 MED ORDER — MIDAZOLAM HCL 2 MG/2ML IJ SOLN
INTRAMUSCULAR | Status: AC
  Filled 2018-01-08: qty 2

## 2018-01-08 MED ORDER — LACTATED RINGERS IV SOLN
INTRAVENOUS | Status: DC
  Administered 2018-01-08: 08:00:00 via INTRAVENOUS

## 2018-01-08 MED ORDER — FENTANYL CITRATE (PF) 100 MCG/2ML IJ SOLN
INTRAMUSCULAR | Status: DC | PRN
  Administered 2018-01-08 (×2): 25 ug via INTRAVENOUS
  Administered 2018-01-08: 50 ug via INTRAVENOUS

## 2018-01-08 MED ORDER — OXYCODONE-ACETAMINOPHEN 7.5-325 MG PO TABS: 1.0000 | ORAL_TABLET | ORAL | 0 refills | Status: DC | PRN

## 2018-01-08 MED ORDER — DEXTROSE 5 % IV SOLN: 3.0000 g | INTRAVENOUS | Status: DC

## 2018-01-08 MED ORDER — PROPOFOL 500 MG/50ML IV EMUL
INTRAVENOUS | Status: DC | PRN
  Administered 2018-01-08: 25 ug/kg/min via INTRAVENOUS

## 2018-01-08 MED ORDER — FENTANYL CITRATE (PF) 100 MCG/2ML IJ SOLN
50.0000 ug | INTRAMUSCULAR | Status: DC | PRN
  Administered 2018-01-08: 100 ug via INTRAVENOUS

## 2018-01-08 MED ORDER — PROPOFOL 10 MG/ML IV BOLUS
INTRAVENOUS | Status: DC | PRN
  Administered 2018-01-08: 300 mg via INTRAVENOUS

## 2018-01-08 MED ORDER — PROPOFOL 500 MG/50ML IV EMUL
INTRAVENOUS | Status: AC
  Filled 2018-01-08: qty 50

## 2018-01-08 MED ORDER — MIDAZOLAM HCL 2 MG/2ML IJ SOLN: 0.5000 mg | Freq: Once | INTRAMUSCULAR | Status: DC | PRN

## 2018-01-08 MED ORDER — CHLORHEXIDINE GLUCONATE 4 % EX LIQD: 60.0000 mL | Freq: Once | CUTANEOUS | Status: DC

## 2018-01-08 MED ORDER — CEFAZOLIN SODIUM-DEXTROSE 2-4 GM/100ML-% IV SOLN
INTRAVENOUS | Status: AC
  Filled 2018-01-08: qty 200

## 2018-01-08 MED ORDER — BUPIVACAINE HCL (PF) 0.25 % IJ SOLN
INTRAMUSCULAR | Status: AC
  Filled 2018-01-08: qty 30

## 2018-01-08 MED ORDER — CEFAZOLIN SODIUM-DEXTROSE 2-4 GM/100ML-% IV SOLN: 2.0000 g | INTRAVENOUS | Status: DC

## 2018-01-08 MED ORDER — MEPERIDINE HCL 25 MG/ML IJ SOLN: 6.2500 mg | INTRAMUSCULAR | Status: DC | PRN

## 2018-01-08 MED ORDER — HYDROMORPHONE HCL 1 MG/ML IJ SOLN: 0.2500 mg | INTRAMUSCULAR | Status: DC | PRN

## 2018-01-08 SURGICAL SUPPLY — 49 items
BLADE MINI RND TIP GREEN BEAV (BLADE) IMPLANT
BLADE SURG 15 STRL LF DISP TIS (BLADE) ×2 IMPLANT
BLADE SURG 15 STRL SS (BLADE) ×2
BNDG COHESIVE 3X5 TAN STRL LF (GAUZE/BANDAGES/DRESSINGS) ×8 IMPLANT
BNDG ESMARK 4X9 LF (GAUZE/BANDAGES/DRESSINGS) ×4 IMPLANT
BNDG GAUZE ELAST 4 BULKY (GAUZE/BANDAGES/DRESSINGS) ×4 IMPLANT
CHLORAPREP W/TINT 26ML (MISCELLANEOUS) ×4 IMPLANT
CORD BIPOLAR FORCEPS 12FT (ELECTRODE) ×4 IMPLANT
COVER BACK TABLE 60X90IN (DRAPES) ×4 IMPLANT
COVER MAYO STAND STRL (DRAPES) ×4 IMPLANT
CUFF TOURN SGL LL 18 NRW (TOURNIQUET CUFF) ×4 IMPLANT
CUFF TOURNIQUET SINGLE 18IN (TOURNIQUET CUFF) ×4 IMPLANT
DECANTER SPIKE VIAL GLASS SM (MISCELLANEOUS) IMPLANT
DRAPE EXTREMITY T 121X128X90 (DRAPE) ×4 IMPLANT
DRAPE SURG 17X23 STRL (DRAPES) ×4 IMPLANT
DRSG PAD ABDOMINAL 8X10 ST (GAUZE/BANDAGES/DRESSINGS) ×4 IMPLANT
GAUZE SPONGE 4X4 12PLY STRL (GAUZE/BANDAGES/DRESSINGS) ×4 IMPLANT
GAUZE SPONGE 4X4 16PLY XRAY LF (GAUZE/BANDAGES/DRESSINGS) IMPLANT
GAUZE XEROFORM 1X8 LF (GAUZE/BANDAGES/DRESSINGS) ×4 IMPLANT
GLOVE BIO SURGEON STRL SZ 6.5 (GLOVE) ×3 IMPLANT
GLOVE BIO SURGEONS STRL SZ 6.5 (GLOVE) ×1
GLOVE BIOGEL PI IND STRL 7.0 (GLOVE) ×4 IMPLANT
GLOVE BIOGEL PI IND STRL 8.5 (GLOVE) ×2 IMPLANT
GLOVE BIOGEL PI INDICATOR 7.0 (GLOVE) ×4
GLOVE BIOGEL PI INDICATOR 8.5 (GLOVE) ×2
GLOVE SURG ORTHO 8.0 STRL STRW (GLOVE) ×4 IMPLANT
GOWN STRL REUS W/ TWL LRG LVL3 (GOWN DISPOSABLE) ×2 IMPLANT
GOWN STRL REUS W/TWL LRG LVL3 (GOWN DISPOSABLE) ×2
GOWN STRL REUS W/TWL XL LVL3 (GOWN DISPOSABLE) ×4 IMPLANT
LOOP VESSEL MAXI BLUE (MISCELLANEOUS) IMPLANT
NEEDLE PRECISIONGLIDE 27X1.5 (NEEDLE) ×4 IMPLANT
NS IRRIG 1000ML POUR BTL (IV SOLUTION) ×4 IMPLANT
PACK BASIN DAY SURGERY FS (CUSTOM PROCEDURE TRAY) ×4 IMPLANT
PAD CAST 3X4 CTTN HI CHSV (CAST SUPPLIES) IMPLANT
PAD CAST 4YDX4 CTTN HI CHSV (CAST SUPPLIES) ×2 IMPLANT
PADDING CAST COTTON 3X4 STRL (CAST SUPPLIES)
PADDING CAST COTTON 4X4 STRL (CAST SUPPLIES) ×2
SLEEVE SCD COMPRESS KNEE MED (MISCELLANEOUS) ×4 IMPLANT
SPLINT PLASTER CAST XFAST 3X15 (CAST SUPPLIES) IMPLANT
SPLINT PLASTER XTRA FASTSET 3X (CAST SUPPLIES)
STOCKINETTE 4X48 STRL (DRAPES) ×4 IMPLANT
SUT ETHILON 4 0 PS 2 18 (SUTURE) ×4 IMPLANT
SUT VIC AB 2-0 SH 27 (SUTURE) ×2
SUT VIC AB 2-0 SH 27XBRD (SUTURE) ×2 IMPLANT
SUT VICRYL 4-0 PS2 18IN ABS (SUTURE) ×4 IMPLANT
SYR BULB 3OZ (MISCELLANEOUS) ×4 IMPLANT
SYR CONTROL 10ML LL (SYRINGE) ×4 IMPLANT
TOWEL OR 17X24 6PK STRL BLUE (TOWEL DISPOSABLE) ×4 IMPLANT
UNDERPAD 30X30 (UNDERPADS AND DIAPERS) ×4 IMPLANT

## 2018-01-08 NOTE — Progress Notes (Signed)
Assisted Dr. Carswell Jackson with left, ultrasound guided, supraclavicular block. Side rails up, monitors on throughout procedure. See vital signs in flow sheet. Tolerated Procedure well. 

## 2018-01-08 NOTE — Discharge Instructions (Addendum)

## 2018-01-08 NOTE — Anesthesia Postprocedure Evaluation (Signed)
Anesthesia Post Note  Patient: [name]  Procedure(s) Performed: LEFT CARPAL TUNNEL RELEASE (Left Wrist) DECOMPRESSION LEFT ULNAR AT ELBOW (Left Elbow)     Patient location during evaluation: PACU Anesthesia Type: General and Regional Level of consciousness: awake and alert, oriented and patient cooperative Pain management: pain level controlled Vital Signs Assessment: post-procedure vital signs reviewed and stable Respiratory status: spontaneous breathing, nonlabored ventilation and respiratory function stable Cardiovascular status: blood pressure returned to baseline and stable Postop Assessment: no apparent nausea or vomiting Anesthetic complications: no Comments: Pt has Horner's from supraclavicular Ropivacaine, counseled that will resolve with block    Last Vitals:  Vitals:   01/08/18 1000 01/08/18 1015  BP: (!) 93/58 100/62  Pulse: 77   Resp: 17   Temp:    SpO2: 94%     Last Pain:  Vitals:   01/08/18 1015  TempSrc:   PainSc: 0-No pain                 Jaquanda Wickersham,E. Jasdeep Kepner

## 2018-01-08 NOTE — H&P (Signed)
[name] is an 57 y.o. male.   Chief Complaint: numbnessHPI: [name] is a 57-year-old right-hand-dominant male referred by Dr. Case for consultation regarding pain numbness and tingling in his left hand. Is been going on for at least a year. He has had nerve conductions done in the past revealing carpal tunnel syndrome. These were approximately 5 years ago. These were done by Dr. Willis. Was very mild at that time. He is complaining of his ring and small finger being numb and tingling on a constant basis. He is complaining of pain in his neck. He states that frequently he will drop his phone when he is talking on it. He has no history of injury to the hand elbow or neck. Is not awakened at night at the present time. States that the numbness and tingling has been going on for a year. He states nothing seems to make it better or worse. He takes Tylenol due to GERD. He has a history of diabetes and arthritis no history of thyroid problems and gout. Family history is positive diabetes negative for thyroid problems arthritis and gout.He was referred to Dr. Karvelas for nerve conductions. His nerve conductions reveal enlargement of the ulnar nerve at his elbow carpal tunnel syndrome along with cubital tunnel by on his left side.              Past Medical History:  Diagnosis Date  . Anginal pain ([facility]) 12/29/2012  . Chronic low back pain   . CKD (chronic kidney disease) stage 3, GFR 30-59 ml/min ([facility])   . Diabetes mellitus type 2, noninsulin dependent ([facility])   . Exertional shortness of breath   . GERD (gastroesophageal reflux disease)   . H/O hiatal hernia   . Headache(784.0)    "not daily, not weekly, but very often" (12/30/2012)  . HTN (hypertension)   . Hyperlipidemia   . Morbid obesity ([facility])   . Osteoarthritis    "hands" (12/30/2012)    Past Surgical History:  Procedure Laterality Date  . ELBOW ARTHROPLASTY Left 1990's   "3 spurs; ?14 staples" (12/30/2012)  . LEFT HEART  CATHETERIZATION WITH CORONARY ANGIOGRAM N/A 12/31/2012   Procedure: LEFT HEART CATHETERIZATION WITH CORONARY ANGIOGRAM;  Surgeon: Michael Cooper, MD;  Location: [facility] CATH LAB;  Service: Cardiovascular;  Laterality: N/A;  . LUMBAR [place] SURGERY  2010; 2011   "2 ruptured discs; 2 pinched nerves"  . SHOULDER SURGERY Right     History reviewed. No pertinent family history. Social History:  reports that he quit smoking about 35 years ago. His smoking use included cigarettes. He has a 12.00 pack-year smoking history. He quit smokeless tobacco use about 35 years ago. His smokeless tobacco use included snuff. He reports that he does not drink alcohol or use drugs.  Allergies:  Allergies  Allergen Reactions  . Tequin [Gatifloxacin] Other (See Comments)    "my mouth broke out"  . Quinolones     No medications prior to admission.    Results for orders placed or performed during the hospital encounter of 01/08/18 (from the past 48 hour(s))  Basic metabolic panel     Status: Abnormal   Collection Time: 01/06/18  9:52 AM  Result Value Ref Range   Sodium 136 135 - 145 mmol/L   Potassium 5.1 3.5 - 5.1 mmol/L    Comment: SLIGHT HEMOLYSIS   Chloride 101 101 - 111 mmol/L   CO2 26 22 - 32 mmol/L   Glucose, Bld 170 (H) 65 - 99 mg/dL     BUN 48 (H) 6 - 20 mg/dL   Creatinine, Ser 2.11 (H) 0.61 - 1.24 mg/dL   Calcium 9.4 8.9 - 10.3 mg/dL   GFR calc non Af Amer 33 (L) >60 mL/min   GFR calc Af Amer 38 (L) >60 mL/min    Comment: (NOTE) The eGFR has been calculated using the CKD EPI equation. This calculation has not been validated in all clinical situations. eGFR's persistently <60 mL/min signify possible Chronic Kidney Disease.    Anion gap 9 5 - 15    Comment: Performed at [facility] [address] [place], [place] [address]    No results found.   Pertinent items are noted in HPI.  Height 6' 3" (1.905 m), weight (!) 165.1 kg (364 lb).  General appearance: alert, cooperative and  appears stated age Head: Normocephalic, without obvious abnormality Neck: no JVD Resp: clear to auscultation bilaterally Cardio: regular rate and rhythm, S1, S2 normal, no murmur, click, rub or gallop GI: soft, non-tender; bowel sounds normal; no masses,  no organomegaly Extremities: numbness left hand Pulses: 2+ and symmetric Skin: Skin color, texture, turgor normal. No rashes or lesions Neurologic: Grossly normal Incision/Wound: na  Assessment/Plan Assessment:  1. Carpal tunnel syndrome of left wrist  2. Entrapment of left ulnar nerve    Plan: His nerve conductions are reviewed with him. Would recommend decompression of the ulnar nerve at the elbow possible transposition along with decompression of the median nerve at the wrist. Pre-peri-postoperative course are discussed along with risk applications. He is aware that there is no guarantee to the surgery the possibility of infection recurrence injury to arteries nerves tendons complete relief symptoms dystrophy. He is scheduled for decompression possible transposition ulnar nerve left elbow carpal tunnel release left hand as an outpatient under regional anesthesia. Questions are encouraged and answered to his satisfaction. Have discussed with him double crush that this may not resolve all symptoms for him. We does advise that we are attempting to halt the process hopefully allow it to get better especially to the ulnar nerve.      Lasalle Abee R 01/08/2018, 5:49 AM

## 2018-01-08 NOTE — Op Note (Signed)
Other Dictation: Dictation Number [id number]

## 2018-01-08 NOTE — Op Note (Signed)
[name]:  [name], Lot             ACCOUNT NO.:  [id number]  MEDICAL RECORD NO.:  [id number]  LOCATION:                                 FACILITY:  PHYSICIAN:  Russell SaltGary Lakira Strickland, M.D.            DATE OF BIRTH:  DATE OF PROCEDURE:  01/08/2018 DATE OF DISCHARGE:                              OPERATIVE REPORT   PREOPERATIVE DIAGNOSIS:  Carpal tunnel/cubital tunnel syndrome, left arm.  POSTOPERATIVE DIAGNOSIS:  Carpal tunnel/cubital tunnel syndrome, left arm.  OPERATIONS: 1. Decompression, left median nerve at the wrist. 2. Decompression, left ulnar nerve at the elbow.  SURGEON:  Russell SaltGary Attallah Ontko, MD.  ASSISTANT:  None.  ANESTHESIA:  Supraclavicular block with general.  [facility]  ANESTHESIOLOGISJean Strickland:  [place].  HISTORY:  The patient is a 57 year old male with a history of carpal tunnel/cubital tunnel syndrome with numbness and tingling in his arm. Nerve conductions are positive.  This has not responded to conservative treatment.  He has elected to undergo surgical decompression of both nerves.  Pre, peri, and postoperative courses have been discussed along with risks and complications.  He is aware there is no guarantee to the surgery, the possibility of infection, recurrence of injury to arteries, nerves, and tendons, incomplete relief of symptoms, and dystrophy.  He is aware that we are attempting to halt the process and hopefully allowing to get better.  In the preoperative area, the patient is seen, the extremity marked by both patient and surgeon, antibiotic given.  DESCRIPTION OF PROCEDURE:  The patient was brought to the operating room after a supraclavicular block was carried out without difficulty in the preoperative area.  General anesthetic was carried out in the operating room.  He was prepped using ChloraPrep in a supine position with left arm free.  A 3-minute dry time was allowed and time-out was taken confirming the patient and procedure.   The limb was exsanguinated with an Esmarch bandage.  Tourniquet placed high on the arm was inflated to 250 mmHg.  A longitudinal incision was made in the left palm, carried down through subcutaneous tissue.  Bleeders were electrocauterized with bipolar.  The palmar fascia was split.  The superficial palmar arch was identified along with flexor tendon to the ring and little finger. These were retracted radially with the median nerve.  The ulnar nerve retracted ulnarly and the flexor retinaculum incised on its ulnar border.  A right angle and Sewell retractor were placed between skin and forearm fascia.  The fascia was then released for approximately 2-3 cm proximal to the wrist crease under direct vision after dissecting deep structures from the fascia.  The wound was irrigated.  The median nerve was explored.  An area of compression was noted.  The motor branch entered into muscle distally.  The skin was closed with interrupted 4-0 nylon sutures.  A separate incision was then made over the medial epicondyle of the left elbow, carried down through subcutaneous tissue. Bleeders were again electrocauterized.  Retractor was placed protecting neurovascular bundles.  The posterior branches of the medial antebrachial cutaneous nerve of the forearm were not seen in the wound. The dissection was  carried down to the medial epicondyle.  Osborne's fascia was then released on its posterior aspect.  A moderate amount of scarring was present about the nerve.  The subcutaneous tissue distally was dissected from the flexor carpi ulnaris.  The knee retractor was placed retracting the soft tissues volarly.  The superficial fascia was split with blunt scissors.  The muscle was then split with blunt dissection and the deep fascia was then released using KMI guide for carpal tunnel release and ENT angled scissors for approximately 8 cm distally.  Attention was then directed proximally.  The brachial fascia was  dissected free from the overlying subcutaneous tissue and skin.  The fascia was then released after placing the [facility] guide between the nerve and fascia to protect the nerve and the EMT scissors were then used to release the fascia proximally for approximately 8 cm.  Wound was copiously irrigated with saline.  On flexion of the elbow, there was no subluxation to the nerve.  The subcutaneous deep tissue was then closed with 2-0 Vicryl deep, 4-0 Vicryl superficially and then 4-0 nylon and a sterile compressive dressing was applied to each of the wounds.  The patient tolerated the procedure well.  On deflation of the tourniquet, all fingers immediately pinked and he was taken to the recovery room for observation in satisfactory condition.  He will be discharged to home to return the [facility] [place] in 1 week, on Percocet.          ______________________________ [name], M.D.     GK/MEDQ  D:  01/08/2018  T:  01/08/2018  Job:  [id number]

## 2018-01-08 NOTE — Anesthesia Procedure Notes (Signed)
Anesthesia Regional Block: Supraclavicular block   Pre-Anesthetic Checklist: ,, timeout performed, Correct Patient, Correct Site, Correct Laterality, Correct Procedure, Correct Position, site marked, Risks and benefits discussed,  Surgical consent,  Pre-op evaluation,  At surgeon's request and post-op pain management  Laterality: Left and Upper  Prep: chloraprep       Needles:  Injection technique: Single-shot  Needle Type: Echogenic Stimulator Needle     Needle Length: 9cm  Needle Gauge: 21     Additional Needles:   Procedures:, nerve stimulator,,, ultrasound used (permanent image in chart),,,,   Nerve Stimulator or Paresthesia:  Response: forearm twitch, 0.4 mA, 0.1 ms,   Additional Responses:   Narrative:  Start time: 01/08/2018 8:12 AM End time: 01/08/2018 8:27 AM Injection made incrementally with aspirations every 5 mL.  Performed by: Personally  Anesthesiologist: Jairo BenJackson, Addis Tuohy, MD  Additional Notes: Pt identified in Holding room.  Monitors applied. Working IV access confirmed. Sterile prep, drape L clavicle and neck.  #21ga ECHOgenic stim needle supraclavicular brachial plexus with US guidance, good forearm twitch 0.894mA. 30cc 0.5% Ropivacaine injected incrementally after negative test dose.  Patient asymptomatic, VSS, no heme aspirated, tolerated well.  Sandford Craze Amarilis Belflower, MD

## 2018-01-08 NOTE — Transfer of Care (Signed)
Immediate Anesthesia Transfer of Care Note  Patient: Acie Fredricksonhomas W Dimino  Procedure(s) Performed: LEFT CARPAL TUNNEL RELEASE (Left Wrist) DECOMPRESSION POSSIBLE TRANSPOSITION LEFT ULNAR AT ELBOW (Left Elbow)  Patient Location: PACU  Anesthesia Type:GA combined with regional for post-op pain  Level of Consciousness: awake and patient cooperative  Airway & Oxygen Therapy: Patient Spontanous Breathing and Patient connected to face mask oxygen  Post-op Assessment: Report given to RN and Post -op Vital signs reviewed and stable  Post vital signs: Reviewed and stable  Last Vitals:  Vitals Value Taken Time  BP    Temp    Pulse 86 01/08/2018  9:48 AM  Resp    SpO2 91 % 01/08/2018  9:48 AM  Vitals shown include unvalidated device data.  Last Pain:  Vitals:   01/08/18 0738  TempSrc: Oral      Patients Stated Pain Goal: 0 (01/08/18 [id number])  Complications: No apparent anesthesia complications

## 2018-01-08 NOTE — Brief Op Note (Signed)
01/08/2018  9:45 AM  PATIENT:  Acie Fredricksonhomas W Hoey  57 y.o. male  PRE-OPERATIVE DIAGNOSIS:  Left Carpal Tunnel Syndrome  Left Cubital Tunnel  G56.02  POST-OPERATIVE DIAGNOSIS:  Left Carpal Tunnel Syndrome  Left Cubital Tunnel  G56.02  PROCEDURE:  Procedure(s): LEFT CARPAL TUNNEL RELEASE (Left) DECOMPRESSION POSSIBLE TRANSPOSITION LEFT ULNAR AT ELBOW (Left)  SURGEON:  Surgeon(s) and Role:    Cindee Salt* Jamon Hayhurst, MD - Primary  PHYSICIAN ASSISTANT:   ASSISTANTS: none   ANESTHESIA:   regional and general  EBL: 1ml  BLOOD ADMINISTERED:none  DRAINS: none   LOCAL MEDICATIONS USED:  NONE  SPECIMEN:  No Specimen  DISPOSITION OF SPECIMEN:  N/A  COUNTS:  YES  TOURNIQUET:   Total Tourniquet Time Documented: Upper Arm (Left) - 44 minutes Total: Upper Arm (Left) - 44 minutes   DICTATION: .Other Dictation: Dictation Number [id number]  PLAN OF CARE: Discharge to home after PACU  PATIENT DISPOSITION:  PACU - hemodynamically stable.

## 2018-01-08 NOTE — Anesthesia Preprocedure Evaluation (Addendum)
Anesthesia Evaluation  Patient identified by MRN, date of birth, ID band Patient awake    Reviewed: Allergy & Precautions, NPO status , Patient's Chart, lab work & pertinent test results  History of Anesthesia Complications Negative for: history of anesthetic complications  Airway Mallampati: II  TM Distance: >3 FB Neck ROM: Full    Dental  (+) Poor Dentition, Missing, Chipped, Dental Advisory Given   Pulmonary former smoker (quit 1984),    breath sounds clear to auscultation       Cardiovascular hypertension, Pt. on medications (-) angina(-) CAD  Rhythm:Regular Rate:Normal  '14 cath: Widely patent coronary arteries, Normal left ventricular systolic function   Neuro/Psych Carpal tunnel/cubital tunnel    GI/Hepatic Neg liver ROS, GERD  Medicated and Controlled,  Endo/Other  diabetes (glu 142), Oral Hypoglycemic AgentsMorbid obesity  Renal/GU Renal InsufficiencyRenal disease (creat 2.11)     Musculoskeletal  (+) Arthritis ,   Abdominal (+) + obese,   Peds  Hematology   Anesthesia Other Findings   Reproductive/Obstetrics                            Anesthesia Physical Anesthesia Plan  ASA: III  Anesthesia Plan: General   Post-op Pain Management: GA combined w/ Regional for post-op pain   Induction: Intravenous  PONV Risk Score and Plan: 2 and Ondansetron and Dexamethasone  Airway Management Planned: LMA  Additional Equipment:   Intra-op Plan:   Post-operative Plan:   Informed Consent: I have reviewed the patients History and Physical, chart, labs and discussed the procedure including the risks, benefits and alternatives for the proposed anesthesia with the patient or authorized representative who has indicated his/her understanding and acceptance.   Dental advisory given  Plan Discussed with: CRNA and Surgeon  Anesthesia Plan Comments: (Plan routine monitors, GA- LMA OK with  supraclavicular block for post op analgesia)        Anesthesia Quick Evaluation

## 2018-01-08 NOTE — Progress Notes (Signed)
Pt with left eye lag, informed Dr. Jean RosenthalJackson,  Came by to see patient, will discharge home

## 2018-01-08 NOTE — Anesthesia Procedure Notes (Signed)
Procedure Name: LMA Insertion Date/Time: 01/08/2018 8:45 AM Performed by: Hampton Bays DesanctisLinka, Makinna Andy L, CRNA Pre-anesthesia Checklist: Patient identified, Emergency Drugs available, Suction available and Patient being monitored Patient Re-evaluated:Patient Re-evaluated prior to induction Oxygen Delivery Method: Circle system utilized Preoxygenation: Pre-oxygenation with 100% oxygen Induction Type: IV induction Ventilation: Mask ventilation without difficulty LMA: LMA inserted LMA Size: 5.0 Number of attempts: 1 Airway Equipment and Method: Bite block Placement Confirmation: positive ETCO2 Tube secured with: Tape Dental Injury: Teeth and Oropharynx as per pre-operative assessment

## 2018-01-09 ENCOUNTER — Encounter (HOSPITAL_BASED_OUTPATIENT_CLINIC_OR_DEPARTMENT_OTHER): Payer: Self-pay | Admitting: Orthopedic Surgery

## 2018-05-06 ENCOUNTER — Ambulatory Visit (INDEPENDENT_AMBULATORY_CARE_PROVIDER_SITE_OTHER): Payer: Medicare Other | Admitting: Internal Medicine

## 2018-05-06 ENCOUNTER — Encounter (INDEPENDENT_AMBULATORY_CARE_PROVIDER_SITE_OTHER): Payer: Self-pay | Admitting: Internal Medicine

## 2018-05-06 VITALS — BP 130/74 | HR 72 | Temp 97.6°F | Ht 75.0 in | Wt 346.1 lb

## 2018-05-06 DIAGNOSIS — D508 Other iron deficiency anemias: Secondary | ICD-10-CM

## 2018-05-06 NOTE — Patient Instructions (Signed)
Labs today.  3 stool cards home with patient.

## 2018-05-06 NOTE — Progress Notes (Addendum)
Subjective:    Patient ID: [name], male    DOB: 01-13-1961, 57 y.o.   MRN: [id number]  HPI Referred by Dr. Lysbeth GalasNyland for anemia. Started on Iron 325mg .States he does not know why he is losing blood. States he gave a stool sample and it was negative for blood.  Last colonoscopy was at age 57 and was normal (screening). No family hx of colon cancer. Appetite is good. No weight loss.  Has a BM every other day  12/21/2008 Colonoscopy: Screening Dr. Linna DarnerAnwar: Normal.    01/23/2018 Hand H 11.5 and 33.3  Hx of diabetes 7-8 yrs, Hypertension,  Review of Systems Past Medical History:  Diagnosis Date  . Anginal pain (HCC) 12/29/2012  . Chronic low back pain   . CKD (chronic kidney disease) stage 3, GFR 30-59 ml/min (HCC)   . Diabetes mellitus type 2, noninsulin dependent (HCC)   . Exertional shortness of breath   . GERD (gastroesophageal reflux disease)   . H/O hiatal hernia   . Headache(784.0)    "not daily, not weekly, but very often" (12/30/2012)  . HTN (hypertension)   . Hyperlipidemia   . Morbid obesity (HCC)   . Osteoarthritis    "hands" (12/30/2012)    Past Surgical History:  Procedure Laterality Date  . CARPAL TUNNEL RELEASE Left 01/08/2018   Procedure: LEFT CARPAL TUNNEL RELEASE;  Surgeon: Cindee SaltKuzma, Gary, MD;  Location: [facility];  Service: Orthopedics;  Laterality: Left;  axillary block  . ELBOW ARTHROPLASTY Left 1990's   "3 spurs; ?14 staples" (12/30/2012)  . LEFT HEART CATHETERIZATION WITH CORONARY ANGIOGRAM N/A 12/31/2012   Procedure: LEFT HEART CATHETERIZATION WITH CORONARY ANGIOGRAM;  Surgeon: Tonny BollmanMichael Cooper, MD;  [facility];  Service: Cardiovascular;  Laterality: N/A;  . LUMBAR DISC SURGERY  2010; 2011   "2 ruptured discs; 2 pinched nerves"  . SHOULDER SURGERY Right   . ULNAR NERVE TRANSPOSITION Left 01/08/2018   Procedure: DECOMPRESSION LEFT ULNAR AT ELBOW;  Surgeon: Cindee SaltKuzma, Gary, MD;  Location: [facility];  Service: Orthopedics;   Laterality: Left;  axillary block    Allergies  Allergen Reactions  . Tequin [Gatifloxacin] Other (See Comments)    "my mouth broke out"  . Quinolones     Current Outpatient Medications on File Prior to Visit  Medication Sig Dispense Refill  . acetaminophen (TYLENOL ARTHRITIS PAIN) 650 MG CR tablet Take 650 mg by mouth every 8 (eight) hours.    . Ascorbic Acid (VITAMIN C PO) Take 1 tablet by mouth daily.    Marland Kitchen. aspirin EC 81 MG tablet Take 81 mg by mouth daily.    Marland Kitchen. docusate sodium (COLACE) 100 MG capsule Take 100 mg by mouth 2 (two) times daily.    . Dulaglutide (TRULICITY) 0.75 MG/0.5ML SOPN Inject into the skin once a week.    . ferrous sulfate 325 (65 FE) MG EC tablet Take 325 mg by mouth 2 (two) times daily after a meal.    . glipiZIDE (GLUCOTROL) 10 MG tablet Take 5 mg by mouth daily before breakfast.     . Insulin Glargine (TOUJEO SOLOSTAR [place]) Inject 90 Units into the skin every morning.    . meloxicam (MOBIC) 7.5 MG tablet Take 7.5 mg by mouth daily.    Marland Kitchen. olmesartan-hydrochlorothiazide (BENICAR HCT) 40-25 MG per tablet Take by mouth daily. Taking 1/2 pill daily.    Marland Kitchen. omeprazole (PRILOSEC) 40 MG capsule Take 40 mg by mouth daily.    . ranitidine (ZANTAC)  300 MG capsule Take 300 mg by mouth daily.     . rosuvastatin (CRESTOR) 20 MG tablet Take 20 mg by mouth daily.     No current facility-administered medications on file prior to visit.          Objective:   Physical Exam Blood pressure 130/74, pulse 72, temperature 97.6 F (36.4 C), height 6\' 3"  (1.905 m), weight (!) 346 lb 1.8 oz (157 kg). Alert and oriented. Skin warm and dry. Oral mucosa is moist.   . Sclera anicteric, conjunctivae is pink. Thyroid not enlarged. No cervical lymphadenopathy. Lungs clear. Heart regular rate and rhythm.  Abdomen is soft. Bowel sounds are positive. No hepatomegaly. No abdominal masses felt. No tenderness.  No edema to lower extremities.           Assessment & Plan:  Anemia. CBC and iron  studies (Ferritin,iron, TIBC). 3 stool cards home with patient.  Last colonoscopy was in almost 10 yrs ago so will probably be set up for one.

## 2018-05-07 LAB — CBC
HCT: 36.9 % — ABNORMAL LOW (ref 38.5–50.0)
Hemoglobin: 12.6 g/dL — ABNORMAL LOW (ref 13.2–17.1)
MCH: 29.7 pg (ref 27.0–33.0)
MCHC: 34.1 g/dL (ref 32.0–36.0)
MCV: 87 fL (ref 80.0–100.0)
MPV: 11 fL (ref 7.5–12.5)
PLATELETS: 247 10*3/uL (ref 140–400)
RBC: 4.24 10*6/uL (ref 4.20–5.80)
RDW: 13 % (ref 11.0–15.0)
WBC: 9 10*3/uL (ref 3.8–10.8)

## 2018-05-07 LAB — FERRITIN: Ferritin: 422 ng/mL — ABNORMAL HIGH (ref 38–380)

## 2018-05-07 LAB — IRON, TOTAL/TOTAL IRON BINDING CAP
%SAT: 44 % (calc) (ref 20–48)
Iron: 134 ug/dL (ref 50–180)
TIBC: 303 mcg/dL (calc) (ref 250–425)

## 2020-02-28 ENCOUNTER — Ambulatory Visit: Payer: Medicare Other | Attending: Family Medicine | Admitting: Physical Therapy

## 2020-02-28 ENCOUNTER — Encounter: Payer: Self-pay | Admitting: Physical Therapy

## 2020-02-28 ENCOUNTER — Other Ambulatory Visit: Payer: Self-pay

## 2020-02-28 DIAGNOSIS — G8929 Other chronic pain: Secondary | ICD-10-CM | POA: Diagnosis present

## 2020-02-28 DIAGNOSIS — M6281 Muscle weakness (generalized): Secondary | ICD-10-CM | POA: Diagnosis present

## 2020-02-28 DIAGNOSIS — M25561 Pain in right knee: Secondary | ICD-10-CM | POA: Insufficient documentation

## 2020-02-28 DIAGNOSIS — R262 Difficulty in walking, not elsewhere classified: Secondary | ICD-10-CM | POA: Insufficient documentation

## 2020-02-28 NOTE — Therapy (Signed)
[facility] [address] [place], [place], [address] Phone: [phone]   Fax:  [phone]  Physical Therapy Evaluation  Patient Details  Name: [name] MRN: [id number] Date of Birth: [birth year] Referring Provider (PT): Delano Metz, MD   Encounter Date: 02/28/2020  PT End of Session - 02/28/20 1838    Visit Number  1    Number of Visits  12    Date for PT Re-Evaluation  04/17/20    Authorization Type  Progress note every 10th visit; KX modifier at 15th visit    PT Start Time  1115    PT Stop Time  1155    PT Time Calculation (min)  40 min    Equipment Utilized During Treatment  --   right knee brace and SPC   Activity Tolerance  Patient tolerated treatment well    Behavior During Therapy  [facility] for tasks assessed/performed       Past Medical History:  Diagnosis Date  . Anginal pain (HCC) 12/29/2012  . Chronic low back pain   . CKD (chronic kidney disease) stage 3, GFR 30-59 ml/min   . Diabetes mellitus type 2, noninsulin dependent (HCC)   . Exertional shortness of breath   . GERD (gastroesophageal reflux disease)   . H/O hiatal hernia   . Headache(784.0)    "not daily, not weekly, but very often" (12/30/2012)  . HTN (hypertension)   . Hyperlipidemia   . Morbid obesity (HCC)   . Osteoarthritis    "hands" (12/30/2012)    Past Surgical History:  Procedure Laterality Date  . CARPAL TUNNEL RELEASE Left 01/08/2018   Procedure: LEFT CARPAL TUNNEL RELEASE;  Surgeon: Cindee Salt, MD;  Location: [facility];  Service: Orthopedics;  Laterality: Left;  axillary block  . ELBOW ARTHROPLASTY Left 1990's   "3 spurs; ?14 staples" (12/30/2012)  . LEFT HEART CATHETERIZATION WITH CORONARY ANGIOGRAM N/A 12/31/2012   Procedure: LEFT HEART CATHETERIZATION WITH CORONARY ANGIOGRAM;  Surgeon: Tonny Bollman, MD;  Location: [facility] CATH LAB;  Service: Cardiovascular;  Laterality: N/A;  . LUMBAR DISC SURGERY  2010; 2011   "2  ruptured discs; 2 pinched nerves"  . SHOULDER SURGERY Right   . ULNAR NERVE TRANSPOSITION Left 01/08/2018   Procedure: DECOMPRESSION LEFT ULNAR AT ELBOW;  Surgeon: Cindee Salt, MD;  Location: [facility];  Service: Orthopedics;  Laterality: Left;  axillary block    There were no vitals filed for this visit.   Subjective Assessment - 02/28/20 1829    Subjective  COVID-19 screening performed upon arrival.Patient arrives to physical therapy with reports of right knee pain that began after twisting his leg while walking in the woods. Patient reports pain with all ADLs, transfers from sit<> stand, and with household chores. Patient reports pain on the medial aspect of the right knee and travels to the posterior aspect. Patient has been wearing a knee brace that he had previously and reports that helps with stability. Patient ambulates with a cane for safety. Patient's pain at worst is 10/10 and pain at best is 7/10. Patient's goals are to decrease pain, improve movement, and improve ability to perform ADLs and home activities.    Pertinent History  HTN, DM, CKD, history of Anginal pain    Limitations  Walking;Standing;House hold activities    How long can you stand comfortably?  short periods    How long can you walk comfortably?  short periods    Patient Stated Goals  decrease pain  Currently in Pain?  Yes    Pain Score  9     Pain Location  Knee    Pain Orientation  Right    Pain Descriptors / Indicators  Throbbing;Aching;Sore;Sharp    Pain Type  Acute pain    Pain Onset  1 to 4 weeks ago    Pain Frequency  Constant    Aggravating Factors   everything    Pain Relieving Factors  nothing    Effect of Pain on Daily Activities  pain with everything         [facility] PT Assessment - 02/28/20 0001      Assessment   Medical Diagnosis  Chronic pain right knee    Referring Provider (PT)  Ledora Bottcher, MD    Onset Date/Surgical Date  --   May 2021, about 02/16/2020   Next MD Visit   03/01/2020    Prior Therapy  no      Precautions   Precautions  Other (comment)    Precaution Comments  pain free strengthening and ROM      Restrictions   Weight Bearing Restrictions  No      Balance Screen   Has the patient fallen in the past 6 months  No    Has the patient had a decrease in activity level because of a fear of falling?   No    Is the patient reluctant to leave their home because of a fear of falling?   No      Home Film/video editor residence    Living Arrangements  Alone      Prior Function   Level of Independence  Independent      Observation/Other Assessments-Edema    Edema  Circumferential      Circumferential Edema   Circumferential - Right  52 cm at mid patella    Circumferential - Left   50 cm at mid patella      ROM / Strength   AROM / PROM / Strength  AROM;PROM;Strength      AROM   AROM Assessment Site  Knee    Right/Left Knee  Right    Right Knee Extension  3    Right Knee Flexion  84      PROM   PROM Assessment Site  Knee    Right/Left Knee  Right    Right Knee Extension  3    Right Knee Flexion  94      Strength   Overall Strength  Deficits;Due to pain    Strength Assessment Site  Knee;Hip    Right/Left Hip  Right    Right Hip Flexion  3-/5    Right Hip ABduction  3+/5    Right Hip ADduction  3+/5    Right/Left Knee  Right    Right Knee Flexion  3+/5    Right Knee Extension  3+/5      Palpation   Palpation comment  very tender to palpation to medial joint line, pain to pes anserine, and to tendons of R hamstring      Special Tests    Special Tests  Knee Special Tests;Laxity/Instability Tests    Laxity/Instability   Anterior drawer test;other    Knee Special tests   Patellofemoral Grind Test (Clarke's Sign)      Anterior drawer test   Findings  Negative    Side  Right      Other   Findings  Positive  comment  Valgus stress test at 0 degrees and 30 degrees      Patellofemoral Grind test  (Clark's Sign)   Findings  Postive    Side   Right      Transfers   Comments  slow cautious transfers for sit <> stand      Ambulation/Gait   Gait Pattern  Step-through pattern;Decreased stride length;Decreased hip/knee flexion - right;Decreased stance time - right;Decreased step length - left;Antalgic;Wide base of support    Gait Comments  ambulates with SPC on affected side and brace                  Objective measurements completed on examination: See above findings.              PT Education - 02/28/20 1837    Education Details  Quad sets, heel slides, glute sets    Person(s) Educated  Patient    Methods  Explanation;Demonstration;Handout    Comprehension  Verbalized understanding;Returned demonstration          PT Long Term Goals - 02/28/20 1839      PT LONG TERM GOAL #1   Title  Patient will be independent with HEP and progression.    Time  6    Period  Weeks    Status  New      PT LONG TERM GOAL #2   Title  Patient will demonstrate 0-110+ degrees of right knee AROM to improve ability to perform functional tasks.    Time  6    Period  Weeks    Status  New      PT LONG TERM GOAL #3   Title  Patient will demonstrate 4/5 or greater right knee MMT to improve stability during functional tasks.    Time  6    Period  Weeks    Status  New      PT LONG TERM GOAL #4   Title  Patient will report ability to perform ADLs and home activites with pain less than or equal to 4/10    Time  6    Period  Weeks    Status  New      PT LONG TERM GOAL #5   Title  ---             Plan - 02/28/20 1847    Clinical Impression Statement  Patient is a 59 year old male who presents to physical therapy with right knee pain, decreased right knee ROM, and decreased right knee and hip MMT. Patient very tender to palpation to medial knee, pes anserine, and hamstring tendons. Patient noted with increased edema in comparison to unaffected knee. Patient ambulated  walking with cane on affected side; gait training provided to improve gait mechanics. Patient and PT discussed plan of care and HEP to which patient reported understanding. Patient would benefit from skilled physical therapy to address deficits and address patient's goals.    Personal Factors and Comorbidities  Comorbidity 3+;Time since onset of injury/illness/exacerbation;Age    Comorbidities  HTN, DM, CKD, history of Anginal pain    Examination-Activity Limitations  Bathing;Transfers;Locomotion Level;Stairs;Stand    Stability/Clinical Decision Making  Stable/Uncomplicated    Clinical Decision Making  Low    Rehab Potential  Fair    PT Frequency  2x / week    PT Duration  6 weeks    PT Treatment/Interventions  ADLs/Self Care Home Management;Cryotherapy;Electrical Stimulation;Iontophoresis 4mg /ml Dexamethasone;Moist Heat;Ultrasound;Stair training;Functional mobility training;Therapeutic activities;Therapeutic exercise;Balance training;Neuromuscular re-education;Manual techniques;Passive  range of motion;Patient/family education;Vasopneumatic Device;Taping    PT Next Visit Plan  nustep, pain free strengthening and ROM, modalities PRN for pain relief    PT Home Exercise Plan  see patient education section    Consulted and Agree with Plan of Care  Patient       Patient will benefit from skilled therapeutic intervention in order to improve the following deficits and impairments:  Decreased activity tolerance, Decreased balance, Abnormal gait, Difficulty walking, Pain, Increased edema, Decreased strength, Decreased range of motion  Visit Diagnosis: Chronic pain of right knee - Plan: PT plan of care cert/re-cert  Muscle weakness (generalized) - Plan: PT plan of care cert/re-cert  Difficulty in walking, not elsewhere classified - Plan: PT plan of care cert/re-cert     Problem List Patient Active Problem List   Diagnosis Date Noted  . Guaiac positive stools 10/24/2016  . Precordial pain  12/30/2012  . HTN (hypertension)   . Diabetes mellitus type 2, noninsulin dependent (HCC)   . Hyperlipidemia   . Morbid obesity (HCC)   . CKD (chronic kidney disease) stage 3, GFR 30-59 ml/min   . Anginal pain (HCC) 12/29/2012    Guss Bunde, PT, DPT 02/28/2020, 6:50 PM  [facility] [address] [place], [place], [address] Phone: [phone]   Fax:  [phone]  Name: BERND CROM MRN: [id number] Date of Birth: 31-May-1961

## 2020-03-01 ENCOUNTER — Encounter: Payer: Self-pay | Admitting: Physical Therapy

## 2020-03-01 ENCOUNTER — Ambulatory Visit: Payer: Medicare Other | Admitting: Physical Therapy

## 2020-03-01 ENCOUNTER — Other Ambulatory Visit: Payer: Self-pay

## 2020-03-01 DIAGNOSIS — R262 Difficulty in walking, not elsewhere classified: Secondary | ICD-10-CM

## 2020-03-01 DIAGNOSIS — M25561 Pain in right knee: Secondary | ICD-10-CM | POA: Diagnosis not present

## 2020-03-01 DIAGNOSIS — M6281 Muscle weakness (generalized): Secondary | ICD-10-CM

## 2020-03-01 NOTE — Therapy (Signed)
[facility] [place], [place], [address] Phone: [phone]   Fax:  [phone]  Physical Therapy Treatment  Patient Details  Name: [name] MRN: [id number] Date of Birth: 1961/07/16 Referring Provider (PT): Ledora Bottcher, MD   Encounter Date: 03/01/2020  PT End of Session - 03/01/20 1332    Visit Number  2    Number of Visits  12    Date for PT Re-Evaluation  04/17/20    Authorization Type  Progress note every 10th visit; KX modifier at 15th visit    PT Start Time  0100    PT Stop Time  0141    PT Time Calculation (min)  41 min    Activity Tolerance  Patient tolerated treatment well    Behavior During Therapy  [facility] for tasks assessed/performed       Past Medical History:  Diagnosis Date  . Anginal pain ([place]) 12/29/2012  . Chronic low back pain   . CKD (chronic kidney disease) stage 3, GFR 30-59 ml/min   . Diabetes mellitus type 2, noninsulin dependent ([place])   . Exertional shortness of breath   . GERD (gastroesophageal reflux disease)   . H/O hiatal hernia   . Headache(784.0)    "not daily, not weekly, but very often" (12/30/2012)  . HTN (hypertension)   . Hyperlipidemia   . Morbid obesity ([place])   . Osteoarthritis    "hands" (12/30/2012)    Past Surgical History:  Procedure Laterality Date  . CARPAL TUNNEL RELEASE Left 01/08/2018   Procedure: LEFT CARPAL TUNNEL RELEASE;  Surgeon: Daryll Brod, MD;  Location: [place];  Service: Orthopedics;  Laterality: Left;  axillary block  . ELBOW ARTHROPLASTY Left 1990's   "3 spurs; ?14 staples" (12/30/2012)  . LEFT HEART CATHETERIZATION WITH CORONARY ANGIOGRAM N/A 12/31/2012   Procedure: LEFT HEART CATHETERIZATION WITH CORONARY ANGIOGRAM;  Surgeon: Sherren Mocha, MD;  Location: [facility] CATH LAB;  Service: Cardiovascular;  Laterality: N/A;  . LUMBAR [place] SURGERY  2010; 2011   "2 ruptured discs; 2 pinched nerves"  . SHOULDER SURGERY Right   . ULNAR NERVE  TRANSPOSITION Left 01/08/2018   Procedure: DECOMPRESSION LEFT ULNAR AT ELBOW;  Surgeon: Daryll Brod, MD;  Location: [place];  Service: Orthopedics;  Laterality: Left;  axillary block    There were no vitals filed for this visit.  Subjective Assessment - 03/01/20 1300    Subjective  COVID-19 screening performed upon arrival.Patient arrived with ongoing pain in knee    Pertinent History  HTN, DM, CKD, history of Anginal pain    Limitations  Walking;Standing;House hold activities    How long can you stand comfortably?  short periods    How long can you walk comfortably?  short periods    Patient Stated Goals  decrease pain    Currently in Pain?  Yes    Pain Score  7     Pain Location  Knee    Pain Orientation  Right    Pain Descriptors / Indicators  Sore;Discomfort    Pain Type  Acute pain    Pain Onset  1 to 4 weeks ago    Pain Frequency  Constant    Aggravating Factors   walking and movement    Pain Relieving Factors  rest                        [facility] Adult PT Treatment/Exercise - 03/01/20 0001  Exercises   Exercises  Knee/Hip      Knee/Hip Exercises: Aerobic   Nustep  L2 x68min UE/LE      Knee/Hip Exercises: Seated   Long Arc Quad  Strengthening;Right;2 sets;10 reps;Weights    Long Arc Quad Weight  3 lbs.    Hamstring Curl  Strengthening;Right;20 reps    Hamstring Limitations  red band      Knee/Hip Exercises: Supine   Heel Slides  AAROM;Right;2 sets;10 reps   with knee glide   Terminal Knee Extension  Strengthening;Right;2 sets;10 reps   with ball squeeze for VMO activation   Terminal Knee Extension Limitations  3#    Straight Leg Raises  Strengthening;Right;10 reps;1 set    Other Supine Knee/Hip Exercises  supin Rt hip abd with red band x30      Modalities   Modalities  Vasopneumatic      Vasopneumatic   Number Minutes Vasopneumatic   10 minutes    Vasopnuematic Location   Knee    Vasopneumatic Pressure  Low     Vasopneumatic Temperature   34                  PT Long Term Goals - 03/01/20 1333      PT LONG TERM GOAL #1   Title  Patient will be independent with HEP and progression.    Time  6    Period  Weeks    Status  On-going      PT LONG TERM GOAL #2   Title  Patient will demonstrate 0-110+ degrees of right knee AROM to improve ability to perform functional tasks.    Time  6    Period  Weeks    Status  On-going      PT LONG TERM GOAL #3   Title  Patient will demonstrate 4/5 or greater right knee MMT to improve stability during functional tasks.    Time  6    Period  Weeks    Status  On-going      PT LONG TERM GOAL #4   Title  Patient will report ability to perform ADLs and home activites with pain less than or equal to 4/10    Time  6    Period  Weeks    Status  On-going            Plan - 03/01/20 1333    Clinical Impression Statement  Patient tolerated treatment well today. Today focused on gentle pain free ROM and strengthening for right knee. Patient arrived with a brace and reported he wears it to avoid knee buckle on right. Patient responded well to ther ex and VASO today. Goals ongoing.    Personal Factors and Comorbidities  Comorbidity 3+;Time since onset of injury/illness/exacerbation;Age    Comorbidities  HTN, DM, CKD, history of Anginal pain    Examination-Activity Limitations  Bathing;Transfers;Locomotion Level;Stairs;Stand    Stability/Clinical Decision Making  Stable/Uncomplicated    Rehab Potential  Fair    PT Frequency  2x / week    PT Duration  6 weeks    PT Treatment/Interventions  ADLs/Self Care Home Management;Cryotherapy;Electrical Stimulation;Iontophoresis 4mg /ml Dexamethasone;Moist Heat;Ultrasound;Stair training;Functional mobility training;Therapeutic activities;Therapeutic exercise;Balance training;Neuromuscular re-education;Manual techniques;Passive range of motion;Patient/family education;Vasopneumatic Device;Taping    PT Next Visit Plan   cont with nustep, pain free strengthening and ROM, modalities PRN for pain relief    Consulted and Agree with Plan of Care  Patient       Patient will benefit from skilled therapeutic  intervention in order to improve the following deficits and impairments:  Decreased activity tolerance, Decreased balance, Abnormal gait, Difficulty walking, Pain, Increased edema, Decreased strength, Decreased range of motion  Visit Diagnosis: Chronic pain of right knee  Muscle weakness (generalized)  Difficulty in walking, not elsewhere classified     Problem List Patient Active Problem List   Diagnosis Date Noted  . Guaiac positive stools 10/24/2016  . Precordial pain 12/30/2012  . HTN (hypertension)   . Diabetes mellitus type 2, noninsulin dependent ([facility])   . Hyperlipidemia   . Morbid obesity ([facility])   . CKD (chronic kidney disease) stage 3, GFR 30-59 ml/min   . Anginal pain ([facility]) 12/29/2012    DUNFORD, CHRISTINA P, PTA 03/01/2020, 1:42 PM  [facility] [address] [place], [place], [address] Phone: [phone]   Fax:  [phone]  Name: [name] MRN: [id number] Date of Birth: 07/12/1961

## 2020-03-06 ENCOUNTER — Ambulatory Visit: Payer: Medicare Other | Admitting: Physical Therapy

## 2020-03-06 ENCOUNTER — Other Ambulatory Visit: Payer: Self-pay

## 2020-03-06 ENCOUNTER — Encounter: Payer: Self-pay | Admitting: Physical Therapy

## 2020-03-06 DIAGNOSIS — M25561 Pain in right knee: Secondary | ICD-10-CM | POA: Diagnosis not present

## 2020-03-06 DIAGNOSIS — G8929 Other chronic pain: Secondary | ICD-10-CM

## 2020-03-06 DIAGNOSIS — M6281 Muscle weakness (generalized): Secondary | ICD-10-CM

## 2020-03-06 DIAGNOSIS — R262 Difficulty in walking, not elsewhere classified: Secondary | ICD-10-CM

## 2020-03-06 NOTE — Therapy (Addendum)
[facility] [place], [place], [address] Phone: [phone]   Fax:  [phone]  Physical Therapy Treatment PHYSICAL THERAPY DISCHARGE SUMMARY  Visits from Start of Care: 3  Current functional level related to goals / functional outcomes: See below   Remaining deficits: See goals   Education / Equipment: HEP Plan: Patient agrees to discharge.  Patient goals were not met. Patient is being discharged due to not returning since the last visit.  ?????     Patient Details  Name: [name] MRN: [id number] Date of Birth: 11-08-1960 Referring Provider (PT): Ledora Bottcher, MD   Encounter Date: 03/06/2020  PT End of Session - 03/06/20 0850    Visit Number  3    Number of Visits  12    Date for PT Re-Evaluation  04/17/20    Authorization Type  Progress note every 10th visit; KX modifier at 15th visit    PT Start Time  0816    PT Stop Time  0900    PT Time Calculation (min)  44 min    Activity Tolerance  Patient limited by pain    Behavior During Therapy  [facility] for tasks assessed/performed       Past Medical History:  Diagnosis Date  . Anginal pain ([place]) 12/29/2012  . Chronic low back pain   . CKD (chronic kidney disease) stage 3, GFR 30-59 ml/min   . Diabetes mellitus type 2, noninsulin dependent ([place])   . Exertional shortness of breath   . GERD (gastroesophageal reflux disease)   . H/O hiatal hernia   . Headache(784.0)    "not daily, not weekly, but very often" (12/30/2012)  . HTN (hypertension)   . Hyperlipidemia   . Morbid obesity ([place])   . Osteoarthritis    "hands" (12/30/2012)    Past Surgical History:  Procedure Laterality Date  . CARPAL TUNNEL RELEASE Left 01/08/2018   Procedure: LEFT CARPAL TUNNEL RELEASE;  Surgeon: Daryll Brod, MD;  Location: [place];  Service: Orthopedics;  Laterality: Left;  axillary block  . ELBOW ARTHROPLASTY Left 1990's   "3 spurs; ?14 staples" (12/30/2012)   . LEFT HEART CATHETERIZATION WITH CORONARY ANGIOGRAM N/A 12/31/2012   Procedure: LEFT HEART CATHETERIZATION WITH CORONARY ANGIOGRAM;  Surgeon: Sherren Mocha, MD;  Location: [facility] CATH LAB;  Service: Cardiovascular;  Laterality: N/A;  . LUMBAR [place] SURGERY  2010; 2011   "2 ruptured discs; 2 pinched nerves"  . SHOULDER SURGERY Right   . ULNAR NERVE TRANSPOSITION Left 01/08/2018   Procedure: DECOMPRESSION LEFT ULNAR AT ELBOW;  Surgeon: Daryll Brod, MD;  Location: [place];  Service: Orthopedics;  Laterality: Left;  axillary block    There were no vitals filed for this visit.  Subjective Assessment - 03/06/20 0816    Subjective  COVID-19 screening performed upon arrival.Patient reported increased pain in right medial knee, required ice and meds over weekend    Pertinent History  HTN, DM, CKD, history of Anginal pain    Limitations  Walking;Standing;House hold activities    How long can you stand comfortably?  short periods    How long can you walk comfortably?  short periods    Patient Stated Goals  decrease pain    Currently in Pain?  Yes    Pain Score  10-Worst pain ever    Pain Location  Knee    Pain Orientation  Right;Medial    Pain Descriptors / Indicators  Discomfort    Pain Type  Acute pain    Pain Onset  1 to 4 weeks ago    Pain Frequency  Constant    Aggravating Factors   everything    Pain Relieving Factors  rest                        [facility] Adult PT Treatment/Exercise - 03/06/20 0001      Knee/Hip Exercises: Aerobic   Nustep  L1 x39mn ROM only UE/LE, monitored      Modalities   Modalities  Iontophoresis;Ultrasound;Vasopneumatic      Ultrasound   Ultrasound Location  medial right knee    Ultrasound Parameters  1.2w/cm2/50%/3.371m x1079m   Ultrasound Goals  Pain;Edema      Iontophoresis   Type of Iontophoresis  Dexamethasone    Location  medial knee    Dose  1.0 1 of 6    Time  8      Vasopneumatic   Number Minutes Vasopneumatic    15 minutes    Vasopnuematic Location   Knee    Vasopneumatic Pressure  Low    Vasopneumatic Temperature   34 for edema                  PT Long Term Goals - 03/01/20 1333      PT LONG TERM GOAL #1   Title  Patient will be independent with HEP and progression.    Time  6    Period  Weeks    Status  On-going      PT LONG TERM GOAL #2   Title  Patient will demonstrate 0-110+ degrees of right knee AROM to improve ability to perform functional tasks.    Time  6    Period  Weeks    Status  On-going      PT LONG TERM GOAL #3   Title  Patient will demonstrate 4/5 or greater right knee MMT to improve stability during functional tasks.    Time  6    Period  Weeks    Status  On-going      PT LONG TERM GOAL #4   Title  Patient will report ability to perform ADLs and home activites with pain less than or equal to 4/10    Time  6    Period  Weeks    Status  On-going            Plan - 03/06/20 [id number] Clinical Impression Statement  Patient tolerated treatment fair due to increased pain in right medial knee. Today focused on gentle conservative treatment to reduce pain. Today ROM in knee gentle slow pace followed by [place] and VASO to reduce pain. Patient has palpable pain in right medial knee. Patient unable to perform any activities at home and increased pain with no relief. Goals ongoing at this time due to pain deficts.    Personal Factors and Comorbidities  Comorbidity 3+;Time since onset of injury/illness/exacerbation;Age    Comorbidities  HTN, DM, CKD, history of Anginal pain    Examination-Activity Limitations  Bathing;Transfers;Locomotion Level;Stairs;Stand    Stability/Clinical Decision Making  Stable/Uncomplicated    Rehab Potential  Fair    PT Frequency  2x / week    PT Duration  6 weeks    PT Treatment/Interventions  ADLs/Self Care Home Management;Cryotherapy;Electrical Stimulation;Iontophoresis 4mg45m Dexamethasone;Moist Heat;Ultrasound;Stair  training;Functional mobility training;Therapeutic activities;Therapeutic exercise;Balance training;Neuromuscular re-education;Manual techniques;Passive range of motion;Patient/family education;Vasopneumatic Device;Taping    PT Next Visit Plan  assess pain and ionto and then cont with nustep, pain free strengthening and ROM, modalities PRN for pain relief    Consulted and Agree with Plan of Care  Patient       Patient will benefit from skilled therapeutic intervention in order to improve the following deficits and impairments:  Decreased activity tolerance, Decreased balance, Abnormal gait, Difficulty walking, Pain, Increased edema, Decreased strength, Decreased range of motion  Visit Diagnosis: Chronic pain of right knee  Muscle weakness (generalized)  Difficulty in walking, not elsewhere classified     Problem List Patient Active Problem List   Diagnosis Date Noted  . Guaiac positive stools 10/24/2016  . Precordial pain 12/30/2012  . HTN (hypertension)   . Diabetes mellitus type 2, noninsulin dependent ([place])   . Hyperlipidemia   . Morbid obesity ([place])   . CKD (chronic kidney disease) stage 3, GFR 30-59 ml/min   . Anginal pain ([place]) 12/29/2012    Katrese Shell P, PTA 03/06/2020, 9:03 AM  [facility] [place], [place], [address] Phone: [phone]   Fax:  [phone]  Name: [name] MRN: [id number] Date of Birth: April 05, 1961

## 2020-03-08 ENCOUNTER — Encounter: Payer: Medicare Other | Admitting: Physical Therapy

## 2020-09-22 ENCOUNTER — Ambulatory Visit: Payer: Medicare Other | Attending: Family Medicine | Admitting: Physical Therapy

## 2020-09-22 ENCOUNTER — Other Ambulatory Visit: Payer: Self-pay

## 2020-09-22 DIAGNOSIS — M25561 Pain in right knee: Secondary | ICD-10-CM | POA: Insufficient documentation

## 2020-09-22 DIAGNOSIS — R262 Difficulty in walking, not elsewhere classified: Secondary | ICD-10-CM | POA: Insufficient documentation

## 2020-09-22 DIAGNOSIS — M6281 Muscle weakness (generalized): Secondary | ICD-10-CM | POA: Insufficient documentation

## 2020-09-22 DIAGNOSIS — G8929 Other chronic pain: Secondary | ICD-10-CM | POA: Diagnosis present

## 2020-09-22 DIAGNOSIS — R600 Localized edema: Secondary | ICD-10-CM | POA: Diagnosis present

## 2020-09-22 NOTE — Therapy (Addendum)
[facility] [address] [place], [place], [address] Phone: [phone]   Fax:  [phone]  Physical Therapy Evaluation  Patient Details  Name: [name] MRN: [id number] Date of Birth: January 31, 1961 Referring Provider (PT): Delano Metz MD   Encounter Date: 09/22/2020   PT End of Session - 09/22/20 1218    Visit Number 1    Number of Visits 12    Date for PT Re-Evaluation 12/21/20    Authorization Type Progress note every 10th visit; KX modifier at 15th visit    PT Start Time 0817    PT Stop Time 0853    PT Time Calculation (min) 36 min    Activity Tolerance Patient limited by pain    Behavior During Therapy [facility] for tasks assessed/performed           Past Medical History:  Diagnosis Date  . Anginal pain ([facility]) 12/29/2012  . Chronic low back pain   . CKD (chronic kidney disease) stage 3, GFR 30-59 ml/min   . Diabetes mellitus type 2, noninsulin dependent ([facility])   . Exertional shortness of breath   . GERD (gastroesophageal reflux disease)   . H/O hiatal hernia   . Headache(784.0)    "not daily, not weekly, but very often" (12/30/2012)  . HTN (hypertension)   . Hyperlipidemia   . Morbid obesity ([facility])   . Osteoarthritis    "hands" (12/30/2012)    Past Surgical History:  Procedure Laterality Date  . CARPAL TUNNEL RELEASE Left 01/08/2018   Procedure: LEFT CARPAL TUNNEL RELEASE;  Surgeon: Cindee Salt, MD;  Location: [facility];  Service: Orthopedics;  Laterality: Left;  axillary block  . ELBOW ARTHROPLASTY Left 1990's   "3 spurs; ?14 staples" (12/30/2012)  . LEFT HEART CATHETERIZATION WITH CORONARY ANGIOGRAM N/A 12/31/2012   Procedure: LEFT HEART CATHETERIZATION WITH CORONARY ANGIOGRAM;  Surgeon: Tonny Bollman, MD;  Location: [facility] CATH LAB;  Service: Cardiovascular;  Laterality: N/A;  . LUMBAR [place] SURGERY  2010; 2011   "2 ruptured discs; 2 pinched nerves"  . SHOULDER SURGERY Right   . ULNAR NERVE  TRANSPOSITION Left 01/08/2018   Procedure: DECOMPRESSION LEFT ULNAR AT ELBOW;  Surgeon: Cindee Salt, MD;  Location: [facility];  Service: Orthopedics;  Laterality: Left;  axillary block    There were no vitals filed for this visit.    Subjective Assessment - 09/22/20 1211    Subjective COVID-19 screen performed prior to patient entering clinic.  The patient presents to the clinic today with a right knee injury as the result of twisting his right knee x 2 while walking in the woods.  He had been on a walker with a knee brace and then to a cane but is now ambulating unassisted.  His pain at rest today is a 2/10 but will rise to higher levels if he walks a lot.  He states he is cautious, however, as he knee has given way.    Pertinent History HTN, DM, CKD, history of Anginal pain, lumbar suregry.    Limitations Walking;Standing;House hold activities    How long can you stand comfortably? short periods    How long can you walk comfortably? short periods    Patient Stated Goals decrease pain and increase function (walking/standing).    Currently in Pain? Yes    Pain Score 2     Pain Location Knee    Pain Orientation Right;Medial    Pain Descriptors / Indicators Aching;Sore;Throbbing    Pain Type  Acute pain    Pain Onset More than a month ago    Pain Frequency Intermittent    Aggravating Factors  'Walking A lot."    Pain Relieving Factors "Relax"              [facility] PT Assessment - 09/22/20 0001      Assessment   Medical Diagnosis Right pateela chondromalacia.    Referring Provider (PT) Delano Metz MD    Onset Date/Surgical Date --   January 25, 2020 (per patient).     Precautions   Precaution Comments Pain-free ther ex.      Restrictions   Weight Bearing Restrictions No      Balance Screen   Has the patient fallen in the past 6 months No    Has the patient had a decrease in activity level because of a fear of falling?  No    Is the patient reluctant to leave  their home because of a fear of falling?  No      Home Environment   Living Environment Private residence      Prior Function   Level of Independence Independent      Observation/Other Assessments   Focus on Therapeutic Outcomes (FOTO)  62% limitation.      Circumferential Edema   Circumferential - Right RT ~ 1.5 cms > LT.      Posture/Postural Control   Posture Comments RT LE ER (tibial ER).  Patient states this is related to an injurt many years ago.      PROM   Right Knee Extension 0    Right Knee Flexion 120      Strength   Right Hip Flexion 4+/5    Right Knee Extension 4+/5      Palpation   Palpation comment Tender to palpation over patient's right knee medial joint line and pes Anserine region.      Ambulation/Gait   Gait Comments The patient is ambulating with his right LE held in external rotation and knee in extension.                      Objective measurements completed on examination: See above findings.       [facility] Adult PT Treatment/Exercise - 09/22/20 0001      Vasopneumatic   Number Minutes Vasopneumatic  15 minutes    Vasopnuematic Location  --   Right knee.   Vasopneumatic Pressure Low                       PT Long Term Goals - 09/22/20 1235      PT LONG TERM GOAL #1   Title Patient will be independent with HEP and progression.    Time 6    Period Weeks    Status New      PT LONG TERM GOAL #2   Title Patient walk a community distance with pain not > 2-3/10.    Time 6    Period Weeks    Status New      PT LONG TERM GOAL #3   Title Patient ambulate with normal right knee flexion during the swing phase.    Time 6    Period Weeks    Status New      PT LONG TERM GOAL #4   Title Patient perform a reciprocating stair gait with one railing.    Time 6    Period Weeks    Status New  Plan - 09/22/20 1229    Clinical Impression Statement The patient presents to [facility] with c/o right knee  pain due to a twisting injury in April of this year.  He is exhibiting full right knee active range of motion.  He demonstrates minimal right knee edema.  He exhibits a very protective gait pattern with his knee held in extension as he feels his knee may give way.  he is tender to palpation over his righ tknee medial joint line. His fucntional mobility is currently impaired and he exhibits a FOTO limitation score of 62%.  Patient will benefit from skilled physical therapy intervention to address deficits and pain.    Personal Factors and Comorbidities Comorbidity 3+;Time since onset of injury/illness/exacerbation;Age;Other    Comorbidities HTN, DM, CKD, history of Anginal pain, lumbar surgery.    Examination-Activity Limitations Locomotion Level;Other    Examination-Participation Restrictions Other    Stability/Clinical Decision Making Stable/Uncomplicated    Rehab Potential Good    PT Frequency 2x / week    PT Duration 6 weeks    PT Treatment/Interventions ADLs/Self Care Home Management;Cryotherapy;Electrical Stimulation;Iontophoresis 4mg /ml Dexamethasone;Moist Heat;Ultrasound;Functional mobility training;Therapeutic activities;Therapeutic exercise;Neuromuscular re-education;Manual techniques;Passive range of motion;Patient/family education;Vasopneumatic Device;Taping    PT Next Visit Plan Nustep with progression to stationary bike per patient tolerance, pain-free right LE ther ex.  Modalities as needed.    Consulted and Agree with Plan of Care Patient           Patient will benefit from skilled therapeutic intervention in order to improve the following deficits and impairments:     Visit Diagnosis: Chronic pain of right knee - Plan: PT plan of care cert/re-cert  Localized edema - Plan: PT plan of care cert/re-cert     Problem List Patient Active Problem List   Diagnosis Date Noted  . Guaiac positive stools 10/24/2016  . Precordial pain 12/30/2012  . HTN (hypertension)   . Diabetes  mellitus type 2, noninsulin dependent ([facility])   . Hyperlipidemia   . Morbid obesity ([facility])   . CKD (chronic kidney disease) stage 3, GFR 30-59 ml/min ([facility])   . Anginal pain ([facility]) 12/29/2012    Aprille Sawhney, 12/31/2012 MPT 09/22/2020, 12:41 PM  [facility] [address] [place], [place], [place] Phone: [phone]   Fax:  [phone]  Name: [name] MRN: Ezzie Dural Date of Birth: 07/29/1961

## 2020-09-25 ENCOUNTER — Ambulatory Visit: Payer: Medicare Other | Admitting: Physical Therapy

## 2020-09-25 ENCOUNTER — Other Ambulatory Visit: Payer: Self-pay

## 2020-09-25 DIAGNOSIS — G8929 Other chronic pain: Secondary | ICD-10-CM

## 2020-09-25 DIAGNOSIS — M6281 Muscle weakness (generalized): Secondary | ICD-10-CM

## 2020-09-25 DIAGNOSIS — M25561 Pain in right knee: Secondary | ICD-10-CM | POA: Diagnosis not present

## 2020-09-25 DIAGNOSIS — R262 Difficulty in walking, not elsewhere classified: Secondary | ICD-10-CM

## 2020-09-25 DIAGNOSIS — R600 Localized edema: Secondary | ICD-10-CM

## 2020-09-25 NOTE — Therapy (Signed)
[facility] [address] [place], [place], [address] Phone: [phone]   Fax:  [phone]  Physical Therapy Treatment  Patient Details  Name: [name] MRN: [id number] Date of Birth: 11-18-1960 Referring Provider (PT): Delano Metz MD   Encounter Date: 09/25/2020   PT End of Session - 09/25/20 0738    Visit Number 2    Number of Visits 12    Date for PT Re-Evaluation 12/21/20    Authorization Type Progress note every 10th visit; KX modifier at 15th visit    PT Start Time 0730    PT Stop Time 0818    PT Time Calculation (min) 48 min    Activity Tolerance Patient tolerated treatment well    Behavior During Therapy [facility] for tasks assessed/performed           Past Medical History:  Diagnosis Date  . Anginal pain (HCC) 12/29/2012  . Chronic low back pain   . CKD (chronic kidney disease) stage 3, GFR 30-59 ml/min   . Diabetes mellitus type 2, noninsulin dependent (HCC)   . Exertional shortness of breath   . GERD (gastroesophageal reflux disease)   . H/O hiatal hernia   . Headache(784.0)    "not daily, not weekly, but very often" (12/30/2012)  . HTN (hypertension)   . Hyperlipidemia   . Morbid obesity (HCC)   . Osteoarthritis    "hands" (12/30/2012)    Past Surgical History:  Procedure Laterality Date  . CARPAL TUNNEL RELEASE Left 01/08/2018   Procedure: LEFT CARPAL TUNNEL RELEASE;  Surgeon: Cindee Salt, MD;  Location: [facility];  Service: Orthopedics;  Laterality: Left;  axillary block  . ELBOW ARTHROPLASTY Left 1990's   "3 spurs; ?14 staples" (12/30/2012)  . LEFT HEART CATHETERIZATION WITH CORONARY ANGIOGRAM N/A 12/31/2012   Procedure: LEFT HEART CATHETERIZATION WITH CORONARY ANGIOGRAM;  Surgeon: Tonny Bollman, MD;  Location: [facility] CATH [facility];  Service: Cardiovascular;  Laterality: N/A;  . LUMBAR DISC SURGERY  2010; 2011   "2 ruptured discs; 2 pinched nerves"  . SHOULDER SURGERY Right   . ULNAR NERVE  TRANSPOSITION Left 01/08/2018   Procedure: DECOMPRESSION LEFT ULNAR AT ELBOW;  Surgeon: Cindee Salt, MD;  Location: [facility];  Service: Orthopedics;  Laterality: Left;  axillary block    There were no vitals filed for this visit.   Subjective Assessment - 09/25/20 0736    Subjective COVID-19 screening performed upon arrival. Patient reported "some soreness" today.    Pertinent History HTN, DM, CKD, history of Anginal pain, lumbar suregry.    Limitations Walking;Standing;House hold activities    How long can you stand comfortably? short periods    How long can you walk comfortably? short periods    Patient Stated Goals decrease pain and increase function (walking/standing).    Currently in Pain? Yes    Pain Score 2     Pain Location Knee    Pain Orientation Right;Medial    Pain Descriptors / Indicators Discomfort;Sore    Pain Type Acute pain    Pain Onset More than a month ago    Pain Frequency Intermittent    Aggravating Factors  prolong walking    Pain Relieving Factors at rest                             [facility] Adult PT Treatment/Exercise - 09/25/20 0001      Knee/Hip Exercises: Aerobic   Nustep N3  x1min UE/LE, monitored      Knee/Hip Exercises: Standing   Heel Raises Both;20 reps    Hip Abduction Stengthening;Right;2 sets;20 reps;Knee straight    Hip Extension Stengthening;Right;2 sets;10 reps;Knee straight    Forward Step Up Right;2 sets;10 reps;Step Height: 6";Hand Hold: 2      Knee/Hip Exercises: Seated   Long Arc Quad Strengthening;Right;2 sets;10 reps;Weights    Long Arc Quad Weight 3 lbs.    Hamstring Curl Strengthening;Right;20 reps    Hamstring Limitations green band      Knee/Hip Exercises: Supine   Straight Leg Raises Strengthening;Right;2 sets;10 reps    Other Supine Knee/Hip Exercises clamshell with green band x30      Vasopneumatic   Number Minutes Vasopneumatic  15 minutes    Vasopnuematic Location  Knee     Vasopneumatic Pressure Low    Vasopneumatic Temperature  for edema and pain                       PT Long Term Goals - 09/25/20 7169      PT LONG TERM GOAL #1   Title Patient will be independent with HEP and progression.    Time 6    Period Weeks    Status On-going      PT LONG TERM GOAL #2   Title Patient walk a community distance with pain not > 2-3/10.    Time 6    Period Weeks    Status On-going      PT LONG TERM GOAL #3   Title Patient ambulate with normal right knee flexion during the swing phase.    Time 6    Period Weeks    Status On-going      PT LONG TERM GOAL #4   Title Patient perform a reciprocating stair gait with one railing.    Time 6    Period Weeks    Status On-going                 Plan - 09/25/20 0804    Clinical Impression Statement Today patient tolerated treatment well and able to progress with right knee strengthening exericses with no reported increased pain. Patient has reported low level soreness and more with any prolong walking. Patient reported swelling in knee and ice and rest helps. Patient current goals ongoing. Responded well to VASO today. Felt better after treatment.    Personal Factors and Comorbidities Comorbidity 3+;Time since onset of injury/illness/exacerbation;Age;Other    Comorbidities HTN, DM, CKD, history of Anginal pain, lumbar surgery.    Examination-Activity Limitations Locomotion Level;Other    Examination-Participation Restrictions Other    Stability/Clinical Decision Making Stable/Uncomplicated    Rehab Potential Good    PT Frequency 2x / week    PT Duration 6 weeks    PT Treatment/Interventions ADLs/Self Care Home Management;Cryotherapy;Electrical Stimulation;Iontophoresis 4mg /ml Dexamethasone;Moist Heat;Ultrasound;Functional mobility training;Therapeutic activities;Therapeutic exercise;Neuromuscular re-education;Manual techniques;Passive range of motion;Patient/family education;Vasopneumatic  Device;Taping    PT Next Visit Plan cont with POC for Nustep with progression to stationary bike per patient tolerance, pain-free right LE ther ex.  Modalities as needed.    Consulted and Agree with Plan of Care Patient           Patient will benefit from skilled therapeutic intervention in order to improve the following deficits and impairments:  Decreased activity tolerance,Decreased balance,Abnormal gait,Difficulty walking,Pain,Increased edema,Decreased strength,Decreased range of motion  Visit Diagnosis: Chronic pain of right knee  Localized edema  Difficulty in walking, not elsewhere classified  Muscle weakness (generalized)     Problem List Patient Active Problem List   Diagnosis Date Noted  . Guaiac positive stools 10/24/2016  . Precordial pain 12/30/2012  . HTN (hypertension)   . Diabetes mellitus type 2, noninsulin dependent (HCC)   . Hyperlipidemia   . Morbid obesity (HCC)   . CKD (chronic kidney disease) stage 3, GFR 30-59 ml/min (HCC)   . Anginal pain (HCC) 12/29/2012    Lupita Rosales P, PTA 09/25/2020, 8:20 AM  [facility] [address] [place], [place], [address] Phone: [phone]   Fax:  [phone]  Name: KEVONTAY BURKS MRN: [id number] Date of Birth: 11-01-1960

## 2020-09-27 ENCOUNTER — Other Ambulatory Visit: Payer: Self-pay

## 2020-09-27 ENCOUNTER — Ambulatory Visit: Payer: Medicare Other | Admitting: Physical Therapy

## 2020-09-27 ENCOUNTER — Encounter: Payer: Self-pay | Admitting: Physical Therapy

## 2020-09-27 DIAGNOSIS — R262 Difficulty in walking, not elsewhere classified: Secondary | ICD-10-CM

## 2020-09-27 DIAGNOSIS — G8929 Other chronic pain: Secondary | ICD-10-CM

## 2020-09-27 DIAGNOSIS — R600 Localized edema: Secondary | ICD-10-CM

## 2020-09-27 DIAGNOSIS — M25561 Pain in right knee: Secondary | ICD-10-CM | POA: Diagnosis not present

## 2020-09-27 DIAGNOSIS — M6281 Muscle weakness (generalized): Secondary | ICD-10-CM

## 2020-09-27 NOTE — Therapy (Signed)
[facility] [address] [place], [place], [address] Phone: [phone]   Fax:  [phone]  Physical Therapy Treatment  Patient Details  Name: [name] MRN: [id number] Date of Birth: [birth year] Referring Provider (PT): Delano Metz MD   Encounter Date: 09/27/2020   PT End of Session - 09/27/20 0738    Visit Number 3    Number of Visits 12    Date for PT Re-Evaluation 12/21/20    Authorization Type Progress note every 10th visit; KX modifier at 15th visit    PT Start Time 0732    PT Stop Time 0815    PT Time Calculation (min) 43 min    Activity Tolerance Patient tolerated treatment well    Behavior During Therapy [facility] for tasks assessed/performed           Past Medical History:  Diagnosis Date  . Anginal pain (HCC) 12/29/2012  . Chronic low back pain   . CKD (chronic kidney disease) stage 3, GFR 30-59 ml/min (HCC)   . Diabetes mellitus type 2, noninsulin dependent (HCC)   . Exertional shortness of breath   . GERD (gastroesophageal reflux disease)   . H/O hiatal hernia   . Headache(784.0)    "not daily, not weekly, but very often" (12/30/2012)  . HTN (hypertension)   . Hyperlipidemia   . Morbid obesity (HCC)   . Osteoarthritis    "hands" (12/30/2012)    Past Surgical History:  Procedure Laterality Date  . CARPAL TUNNEL RELEASE Left 01/08/2018   Procedure: LEFT CARPAL TUNNEL RELEASE;  Surgeon: Cindee Salt, MD;  Location: [facility];  Service: Orthopedics;  Laterality: Left;  axillary block  . ELBOW ARTHROPLASTY Left 1990's   "3 spurs; ?14 staples" (12/30/2012)  . LEFT HEART CATHETERIZATION WITH CORONARY ANGIOGRAM N/A 12/31/2012   Procedure: LEFT HEART CATHETERIZATION WITH CORONARY ANGIOGRAM;  Surgeon: Tonny Bollman, MD;  Location: [facility] CATH LAB;  Service: Cardiovascular;  Laterality: N/A;  . LUMBAR DISC SURGERY  2010; 2011   "2 ruptured discs; 2 pinched nerves"  . SHOULDER SURGERY Right   . ULNAR  NERVE TRANSPOSITION Left 01/08/2018   Procedure: DECOMPRESSION LEFT ULNAR AT ELBOW;  Surgeon: Cindee Salt, MD;  Location: [facility];  Service: Orthopedics;  Laterality: Left;  axillary block    There were no vitals filed for this visit.   Subjective Assessment - 09/27/20 0737    Subjective COVID-19 screening performed upon arrival. Patient reported "soreness" today.    Pertinent History HTN, DM, CKD, history of Anginal pain, lumbar suregry.    Limitations Walking;Standing;House hold activities    How long can you stand comfortably? short periods    How long can you walk comfortably? short periods    Patient Stated Goals decrease pain and increase function (walking/standing).    Currently in Pain? Yes    Pain Score 7     Pain Location Knee    Pain Orientation Right    Pain Descriptors / Indicators Sore    Pain Type Acute pain    Pain Onset More than a month ago    Pain Frequency Intermittent              [facility] PT Assessment - 09/27/20 0001      Assessment   Medical Diagnosis Right pateela chondromalacia.    Referring Provider (PT) Delano Metz MD    Onset Date/Surgical Date 01/25/20    Next MD Visit None      Precautions  Precautions Other (comment)    Precaution Comments Pain-free ther ex.      Restrictions   Weight Bearing Restrictions No                         [facility] Adult PT Treatment/Exercise - 09/27/20 0001      Knee/Hip Exercises: Aerobic   Nustep L3 x17 min      Knee/Hip Exercises: Standing   Rocker Board 3 minutes      Knee/Hip Exercises: Seated   Hamstring Curl Strengthening;Right;20 reps    Hamstring Limitations red theraband      Knee/Hip Exercises: Supine   Short Arc Quad Sets Strengthening;Right;2 sets;10 reps;Limitations    Short Arc Quad Sets Limitations 2#    Straight Leg Raises Strengthening;Right;2 sets;10 reps      Modalities   Modalities Vasopneumatic      Vasopneumatic   Number Minutes Vasopneumatic   15 minutes    Vasopnuematic Location  Knee    Vasopneumatic Pressure Low    Vasopneumatic Temperature  for edema and pain                       PT Long Term Goals - 09/25/20 8416      PT LONG TERM GOAL #1   Title Patient will be independent with HEP and progression.    Time 6    Period Weeks    Status On-going      PT LONG TERM GOAL #2   Title Patient walk a community distance with pain not > 2-3/10.    Time 6    Period Weeks    Status On-going      PT LONG TERM GOAL #3   Title Patient ambulate with normal right knee flexion during the swing phase.    Time 6    Period Weeks    Status On-going      PT LONG TERM GOAL #4   Title Patient perform a reciprocating stair gait with one railing.    Time 6    Period Weeks    Status On-going                 Plan - 09/27/20 0817    Clinical Impression Statement Patient presented in clinic with reports of increased soreness of R knee. Patient noted soreness since previous PT session. Patient guided through light stretching and strengthening with no complaints of any increased pain. Patient reports intermittant buckling of R knee and greatest pain still with ambulating. Normal vasopneumatic response noted following removal of the modality.    Personal Factors and Comorbidities Comorbidity 3+;Time since onset of injury/illness/exacerbation;Age;Other    Comorbidities HTN, DM, CKD, history of Anginal pain, lumbar surgery.    Examination-Activity Limitations Locomotion Level;Other    Examination-Participation Restrictions Other    Stability/Clinical Decision Making Stable/Uncomplicated    Rehab Potential Good    PT Frequency 2x / week    PT Duration 6 weeks    PT Treatment/Interventions ADLs/Self Care Home Management;Cryotherapy;Electrical Stimulation;Iontophoresis 4mg /ml Dexamethasone;Moist Heat;Ultrasound;Functional mobility training;Therapeutic activities;Therapeutic exercise;Neuromuscular re-education;Manual  techniques;Passive range of motion;Patient/family education;Vasopneumatic Device;Taping    PT Next Visit Plan cont with POC for Nustep with progression to stationary bike per patient tolerance, pain-free right LE ther ex.  Modalities as needed.    PT Home Exercise Plan see patient education section    Consulted and Agree with Plan of Care Patient           Patient  will benefit from skilled therapeutic intervention in order to improve the following deficits and impairments:  Decreased activity tolerance,Decreased balance,Abnormal gait,Difficulty walking,Pain,Increased edema,Decreased strength,Decreased range of motion  Visit Diagnosis: Chronic pain of right knee  Localized edema  Difficulty in walking, not elsewhere classified  Muscle weakness (generalized)     Problem List Patient Active Problem List   Diagnosis Date Noted  . Guaiac positive stools 10/24/2016  . Precordial pain 12/30/2012  . HTN (hypertension)   . Diabetes mellitus type 2, noninsulin dependent (HCC)   . Hyperlipidemia   . Morbid obesity (HCC)   . CKD (chronic kidney disease) stage 3, GFR 30-59 ml/min (HCC)   . Anginal pain (HCC) 12/29/2012    Marvell Fuller, PTA 09/27/2020, 8:23 AM  [facility] [address] [place], [place], [address] Phone: [phone]   Fax:  [phone]  Name: BRENTYN SEEHAFER MRN: [id number] Date of Birth: 1961-10-04

## 2020-10-02 ENCOUNTER — Ambulatory Visit: Payer: Medicare Other | Admitting: Physical Therapy

## 2020-10-04 ENCOUNTER — Encounter: Payer: Self-pay | Admitting: Physical Therapy

## 2020-10-04 ENCOUNTER — Other Ambulatory Visit: Payer: Self-pay

## 2020-10-04 ENCOUNTER — Ambulatory Visit: Payer: Medicare Other | Admitting: Physical Therapy

## 2020-10-04 DIAGNOSIS — M6281 Muscle weakness (generalized): Secondary | ICD-10-CM

## 2020-10-04 DIAGNOSIS — M25561 Pain in right knee: Secondary | ICD-10-CM | POA: Diagnosis not present

## 2020-10-04 DIAGNOSIS — R262 Difficulty in walking, not elsewhere classified: Secondary | ICD-10-CM

## 2020-10-04 DIAGNOSIS — R600 Localized edema: Secondary | ICD-10-CM

## 2020-10-04 NOTE — Therapy (Signed)
[facility] [address] [place], [place], [address] Phone: [phone]   Fax:  [phone]  Physical Therapy Treatment  Patient Details  Name: [name] MRN: [id number] Date of Birth: 1961-09-25 Referring Provider (PT): Delano Metz MD   Encounter Date: 10/04/2020   PT End of Session - 10/04/20 9371    Visit Number 4    Number of Visits 12    Date for PT Re-Evaluation 12/21/20    Authorization Type Progress note every 10th visit; KX modifier at 15th visit    PT Start Time 0731    PT Stop Time 0815    PT Time Calculation (min) 44 min    Activity Tolerance Patient tolerated treatment well    Behavior During Therapy [facility] for tasks assessed/performed           Past Medical History:  Diagnosis Date  . Anginal pain (HCC) 12/29/2012  . Chronic low back pain   . CKD (chronic kidney disease) stage 3, GFR 30-59 ml/min (HCC)   . Diabetes mellitus type 2, noninsulin dependent (HCC)   . Exertional shortness of breath   . GERD (gastroesophageal reflux disease)   . H/O hiatal hernia   . Headache(784.0)    "not daily, not weekly, but very often" (12/30/2012)  . HTN (hypertension)   . Hyperlipidemia   . Morbid obesity (HCC)   . Osteoarthritis    "hands" (12/30/2012)    Past Surgical History:  Procedure Laterality Date  . CARPAL TUNNEL RELEASE Left 01/08/2018   Procedure: LEFT CARPAL TUNNEL RELEASE;  Surgeon: Cindee Salt, MD;  Location: [facility];  Service: Orthopedics;  Laterality: Left;  axillary block  . ELBOW ARTHROPLASTY Left 1990's   "3 spurs; ?14 staples" (12/30/2012)  . LEFT HEART CATHETERIZATION WITH CORONARY ANGIOGRAM N/A 12/31/2012   Procedure: LEFT HEART CATHETERIZATION WITH CORONARY ANGIOGRAM;  Surgeon: Tonny Bollman, MD;  Location: [facility] CATH LAB;  Service: Cardiovascular;  Laterality: N/A;  . LUMBAR DISC SURGERY  2010; 2011   "2 ruptured discs; 2 pinched nerves"  . SHOULDER SURGERY Right   . ULNAR  NERVE TRANSPOSITION Left 01/08/2018   Procedure: DECOMPRESSION LEFT ULNAR AT ELBOW;  Surgeon: Cindee Salt, MD;  Location: [facility];  Service: Orthopedics;  Laterality: Left;  axillary block    There were no vitals filed for this visit.   Subjective Assessment - 10/04/20 0832    Subjective COVID-19 screening performed upon arrival. Patient reported "soreness" today.    Pertinent History HTN, DM, CKD, history of Anginal pain, lumbar suregry.    Limitations Walking;Standing;House hold activities    How long can you stand comfortably? short periods    How long can you walk comfortably? short periods    Patient Stated Goals decrease pain and increase function (walking/standing).    Currently in Pain? Yes    Pain Score 2     Pain Location Knee    Pain Orientation Right    Pain Descriptors / Indicators Sore    Pain Type Acute pain    Pain Onset More than a month ago    Pain Frequency Constant              [facility] PT Assessment - 10/04/20 0001      Assessment   Medical Diagnosis Right pateela chondromalacia.    Referring Provider (PT) Royston Sinner Corrington MD    Onset Date/Surgical Date 01/25/20    Hand Dominance Right    Next MD Visit None  Precautions   Precautions Other (comment)    Precaution Comments Pain-free ther ex.      Restrictions   Weight Bearing Restrictions No                         [facility] Adult PT Treatment/Exercise - 10/04/20 0001      Exercises   Exercises Knee/Hip      Knee/Hip Exercises: Aerobic   Nustep L4 x17 min      Knee/Hip Exercises: Standing   Terminal Knee Extension Strengthening;Right;20 reps;Theraband    Theraband Level (Terminal Knee Extension) Level 2 (Red)    Step Down Right;2 sets;10 reps;Hand Hold: 2;Step Height: 4"    Rocker Board 2 minutes      Knee/Hip Exercises: Seated   Long Arc Quad Strengthening;Right;2 sets;10 reps;Weights    Long Arc Quad Weight 3 lbs.      Modalities   Modalities Vasopneumatic       Vasopneumatic   Number Minutes Vasopneumatic  15 minutes    Vasopnuematic Location  Knee    Vasopneumatic Pressure Low    Vasopneumatic Temperature  34/soreness                       PT Long Term Goals - 09/25/20 0738      PT LONG TERM GOAL #1   Title Patient will be independent with HEP and progression.    Time 6    Period Weeks    Status On-going      PT LONG TERM GOAL #2   Title Patient walk a community distance with pain not > 2-3/10.    Time 6    Period Weeks    Status On-going      PT LONG TERM GOAL #3   Title Patient ambulate with normal right knee flexion during the swing phase.    Time 6    Period Weeks    Status On-going      PT LONG TERM GOAL #4   Title Patient perform a reciprocating stair gait with one railing.    Time 6    Period Weeks    Status On-going                 Plan - 10/04/20 7425    Clinical Impression Statement Patient presented in clinic with reports of low level soreness of R knee. Patient able to progress to light strengthening of R quad. Patient reported increased R knee soreness to 3/10 during therex session. Patient indicated that most soreness was in posterior R knee. Normal vasopneumatic response noted following removal of the modality.    Personal Factors and Comorbidities Comorbidity 3+;Time since onset of injury/illness/exacerbation;Age;Other    Comorbidities HTN, DM, CKD, history of Anginal pain, lumbar surgery.    Examination-Activity Limitations Locomotion Level;Other    Examination-Participation Restrictions Other    Stability/Clinical Decision Making Stable/Uncomplicated    Rehab Potential Good    PT Frequency 2x / week    PT Duration 6 weeks    PT Treatment/Interventions ADLs/Self Care Home Management;Cryotherapy;Electrical Stimulation;Iontophoresis 4mg /ml Dexamethasone;Moist Heat;Ultrasound;Functional mobility training;Therapeutic activities;Therapeutic exercise;Neuromuscular re-education;Manual  techniques;Passive range of motion;Patient/family education;Vasopneumatic Device;Taping    PT Next Visit Plan cont with POC for Nustep with progression to stationary bike per patient tolerance, pain-free right LE ther ex.  Modalities as needed.    PT Home Exercise Plan see patient education section    Consulted and Agree with Plan of Care Patient  Patient will benefit from skilled therapeutic intervention in order to improve the following deficits and impairments:  Decreased activity tolerance,Decreased balance,Abnormal gait,Difficulty walking,Pain,Increased edema,Decreased strength,Decreased range of motion  Visit Diagnosis: Chronic pain of right knee  Localized edema  Difficulty in walking, not elsewhere classified  Muscle weakness (generalized)     Problem List Patient Active Problem List   Diagnosis Date Noted  . Guaiac positive stools 10/24/2016  . Precordial pain 12/30/2012  . HTN (hypertension)   . Diabetes mellitus type 2, noninsulin dependent (HCC)   . Hyperlipidemia   . Morbid obesity (HCC)   . CKD (chronic kidney disease) stage 3, GFR 30-59 ml/min (HCC)   . Anginal pain (HCC) 12/29/2012    Marvell Fuller, PTA 10/04/2020, 8:43 AM  [facility] [address] [place], [place], [address] Phone: [phone]   Fax:  [phone]  Name: [name] MRN: [id number] Date of Birth: [birth year]

## 2020-10-09 ENCOUNTER — Ambulatory Visit: Payer: Medicare Other | Admitting: Physical Therapy

## 2020-10-09 ENCOUNTER — Other Ambulatory Visit: Payer: Self-pay

## 2020-10-09 DIAGNOSIS — M25561 Pain in right knee: Secondary | ICD-10-CM | POA: Diagnosis not present

## 2020-10-09 DIAGNOSIS — R600 Localized edema: Secondary | ICD-10-CM

## 2020-10-09 DIAGNOSIS — M6281 Muscle weakness (generalized): Secondary | ICD-10-CM

## 2020-10-09 DIAGNOSIS — R262 Difficulty in walking, not elsewhere classified: Secondary | ICD-10-CM

## 2020-10-09 NOTE — Therapy (Signed)
[facility] [address] [place], [place], [address] Phone: [phone]   Fax:  [phone]  Physical Therapy Treatment  Patient Details  Name: [name] MRN: [id number] Date of Birth: 1961-05-30 Referring Provider (PT): Delano Metz MD   Encounter Date: 10/09/2020   PT End of Session - 10/09/20 0739    Visit Number 5    Number of Visits 12    Date for PT Re-Evaluation 12/21/20    Authorization Type Progress note every 10th visit; KX modifier at 15th visit    PT Start Time 0730    PT Stop Time 0822    PT Time Calculation (min) 52 min    Activity Tolerance Patient tolerated treatment well    Behavior During Therapy [facility] for tasks assessed/performed           Past Medical History:  Diagnosis Date  . Anginal pain (HCC) 12/29/2012  . Chronic low back pain   . CKD (chronic kidney disease) stage 3, GFR 30-59 ml/min (HCC)   . Diabetes mellitus type 2, noninsulin dependent (HCC)   . Exertional shortness of breath   . GERD (gastroesophageal reflux disease)   . H/O hiatal hernia   . Headache(784.0)    "not daily, not weekly, but very often" (12/30/2012)  . HTN (hypertension)   . Hyperlipidemia   . Morbid obesity (HCC)   . Osteoarthritis    "hands" (12/30/2012)    Past Surgical History:  Procedure Laterality Date  . CARPAL TUNNEL RELEASE Left 01/08/2018   Procedure: LEFT CARPAL TUNNEL RELEASE;  Surgeon: Cindee Salt, MD;  Location: [facility];  Service: Orthopedics;  Laterality: Left;  axillary block  . ELBOW ARTHROPLASTY Left 1990's   "3 spurs; ?14 staples" (12/30/2012)  . LEFT HEART CATHETERIZATION WITH CORONARY ANGIOGRAM N/A 12/31/2012   Procedure: LEFT HEART CATHETERIZATION WITH CORONARY ANGIOGRAM;  Surgeon: Tonny Bollman, MD;  Location: [facility] CATH LAB;  Service: Cardiovascular;  Laterality: N/A;  . LUMBAR DISC SURGERY  2010; 2011   "2 ruptured discs; 2 pinched nerves"  . SHOULDER SURGERY Right   . ULNAR  NERVE TRANSPOSITION Left 01/08/2018   Procedure: DECOMPRESSION LEFT ULNAR AT ELBOW;  Surgeon: Cindee Salt, MD;  Location: [facility];  Service: Orthopedics;  Laterality: Left;  axillary block    There were no vitals filed for this visit.   Subjective Assessment - 10/09/20 0738    Subjective COVID-19 screening performed upon arrival. Patient arrived with ongoing discomfort    Pertinent History HTN, DM, CKD, history of Anginal pain, lumbar suregry.    Limitations Walking;Standing;House hold activities    How long can you stand comfortably? short periods    How long can you walk comfortably? short periods    Patient Stated Goals decrease pain and increase function (walking/standing).    Currently in Pain? Yes    Pain Score 2     Pain Location Knee    Pain Orientation Right    Pain Descriptors / Indicators Sore    Pain Type Acute pain    Pain Onset More than a month ago    Pain Frequency Constant    Aggravating Factors  prolong walking    Pain Relieving Factors rest                             [facility] Adult PT Treatment/Exercise - 10/09/20 0001      Knee/Hip Exercises: Aerobic   Nustep L5  x52min UE/LE activity, monitored      Knee/Hip Exercises: Standing   Terminal Knee Extension Strengthening;Right;20 reps;Theraband;10 reps    Theraband Level (Terminal Knee Extension) Level 2 (Red)    Forward Step Up Right;2 sets;10 reps;Step Height: 4"    Step Down Right;2 sets;10 reps;Hand Hold: 2;Step Height: 4"    Rocker Board 2 minutes      Knee/Hip Exercises: Supine   Short Arc Quad Sets Strengthening;Right;3 sets;10 reps    Short Arc Quad Sets Limitations 3# with add squeeze for VMO activation    Bridges Strengthening;20 reps    Straight Leg Raises Strengthening;Right;2 sets;10 reps      Vasopneumatic   Number Minutes Vasopneumatic  15 minutes    Vasopnuematic Location  Knee    Vasopneumatic Pressure Low    Vasopneumatic Temperature  34/soreness                   PT Education - 10/09/20 0803    Education Details HEP with red band    Person(s) Educated Patient    Methods Explanation;Demonstration;Handout    Comprehension Verbalized understanding;Returned demonstration               PT Long Term Goals - 10/09/20 0740      PT LONG TERM GOAL #1   Title Patient will be independent with HEP and progression.    Baseline issued HEP today 10/09/20    Time 6    Period Weeks    Status On-going      PT LONG TERM GOAL #2   Title Patient walk a community distance with pain not > 2-3/10.    Baseline Patient unable to tolerate community distance at this time 10/09/20    Time 6    Period Weeks    Status On-going      PT LONG TERM GOAL #3   Title Patient ambulate with normal right knee flexion during the swing phase.    Baseline ongoing deficts with ambulation 10/09/20    Time 6    Period Weeks    Status On-going      PT LONG TERM GOAL #4   Title Patient perform a reciprocating stair gait with one railing.    Baseline unable at this time 10/09/20    Time 6    Period Weeks    Status On-going                 Plan - 10/09/20 0810    Clinical Impression Statement Patient tolerated treatment well today. Patient able to progress with PRE's today to strengthen right knee. Patient reported ongoing weakness and discomfort esp with prolong walking and standing activity. HEP issued today with red t-band. Patient current goals ongoing due to limitations.    Personal Factors and Comorbidities Comorbidity 3+;Time since onset of injury/illness/exacerbation;Age;Other    Comorbidities HTN, DM, CKD, history of Anginal pain, lumbar surgery.    Examination-Activity Limitations Locomotion Level;Other    Examination-Participation Restrictions Other    Stability/Clinical Decision Making Stable/Uncomplicated    Rehab Potential Good    PT Frequency 2x / week    PT Duration 6 weeks    PT Treatment/Interventions ADLs/Self Care Home  Management;Cryotherapy;Electrical Stimulation;Iontophoresis 4mg /ml Dexamethasone;Moist Heat;Ultrasound;Functional mobility training;Therapeutic activities;Therapeutic exercise;Neuromuscular re-education;Manual techniques;Passive range of motion;Patient/family education;Vasopneumatic Device;Taping    PT Next Visit Plan cont with POC for Nustep with progression to stationary bike per patient tolerance, pain-free right LE ther ex.  Modalities as needed.    Consulted and Agree with Plan  of Care Patient           Patient will benefit from skilled therapeutic intervention in order to improve the following deficits and impairments:  Decreased activity tolerance,Decreased balance,Abnormal gait,Difficulty walking,Pain,Increased edema,Decreased strength,Decreased range of motion  Visit Diagnosis: Chronic pain of right knee  Localized edema  Difficulty in walking, not elsewhere classified  Muscle weakness (generalized)     Problem List Patient Active Problem List   Diagnosis Date Noted  . Guaiac positive stools 10/24/2016  . Precordial pain 12/30/2012  . HTN (hypertension)   . Diabetes mellitus type 2, noninsulin dependent (HCC)   . Hyperlipidemia   . Morbid obesity (HCC)   . CKD (chronic kidney disease) stage 3, GFR 30-59 ml/min (HCC)   . Anginal pain (HCC) 12/29/2012    Warrene Kapfer P, PTA 10/09/2020, 8:22 AM  [facility] [address] [place], [place], [address] Phone: [phone]   Fax:  [phone]  Name: [name] MRN: [id number] Date of Birth: [birth year]

## 2020-10-09 NOTE — Patient Instructions (Signed)
    Strengthening: Hip Abduction (Side-Lying)   Tighten muscles on front of left thigh, then lift leg __5__ inches from surface, keeping knee locked.  Repeat __10__ times per set. Do __2__ sets per session. Do _2___ sessions per day.    Terminal Knee Extension (Standing)    Facing anchor with right knee slightly bent and tubing just above knee, gently pull knee back straight. Do not overextend knee. Repeat _10___ times per set. Do __1-3__ sets per session. Do _1-2___ sessions per day.      Strengthening: Hip Abduction (Side-Lying)  Strengthening: Straight Leg Raise (Phase 1)  Repeat _10___ times per set. Do __2__ sets per session. Do __2__ sessions per day.     Bridging   Slowly raise buttocks from floor, keeping stomach tight. Repeat _10___ times per set. Do __2__ sets per session. Do __2__ sessions per day.   Straight Leg Raise   Tighten stomach and slowly raise locked right leg __4__ inches from floor. Repeat __10-30__ times per set. Do __2__ sets per session. Do __2__ sessions per day.

## 2020-10-11 ENCOUNTER — Ambulatory Visit: Payer: Medicare Other | Admitting: Physical Therapy

## 2020-10-11 ENCOUNTER — Encounter: Payer: Self-pay | Admitting: Physical Therapy

## 2020-10-11 ENCOUNTER — Other Ambulatory Visit: Payer: Self-pay

## 2020-10-11 DIAGNOSIS — M6281 Muscle weakness (generalized): Secondary | ICD-10-CM

## 2020-10-11 DIAGNOSIS — M25561 Pain in right knee: Secondary | ICD-10-CM | POA: Diagnosis not present

## 2020-10-11 DIAGNOSIS — G8929 Other chronic pain: Secondary | ICD-10-CM

## 2020-10-11 DIAGNOSIS — R600 Localized edema: Secondary | ICD-10-CM

## 2020-10-11 DIAGNOSIS — R262 Difficulty in walking, not elsewhere classified: Secondary | ICD-10-CM

## 2020-10-11 NOTE — Therapy (Signed)
[facility] [address] [place], [place], [address] Phone: [phone]   Fax:  [phone]  Physical Therapy Treatment  Patient Details  Name: [name] MRN: [id number] Date of Birth: 1961/10/13 Referring Provider (PT): Delano Metz MD   Encounter Date: 10/11/2020   PT End of Session - 10/11/20 0815    Visit Number 6    Number of Visits 12    Date for PT Re-Evaluation 12/21/20    Authorization Type Progress note every 10th visit; KX modifier at 15th visit    PT Start Time 0731    PT Stop Time 0815    PT Time Calculation (min) 44 min    Activity Tolerance Patient tolerated treatment well    Behavior During Therapy [facility] for tasks assessed/performed           Past Medical History:  Diagnosis Date  . Anginal pain (HCC) 12/29/2012  . Chronic low back pain   . CKD (chronic kidney disease) stage 3, GFR 30-59 ml/min (HCC)   . Diabetes mellitus type 2, noninsulin dependent (HCC)   . Exertional shortness of breath   . GERD (gastroesophageal reflux disease)   . H/O hiatal hernia   . Headache(784.0)    "not daily, not weekly, but very often" (12/30/2012)  . HTN (hypertension)   . Hyperlipidemia   . Morbid obesity (HCC)   . Osteoarthritis    "hands" (12/30/2012)    Past Surgical History:  Procedure Laterality Date  . CARPAL TUNNEL RELEASE Left 01/08/2018   Procedure: LEFT CARPAL TUNNEL RELEASE;  Surgeon: Cindee Salt, MD;  Location: [facility];  Service: Orthopedics;  Laterality: Left;  axillary block  . ELBOW ARTHROPLASTY Left 1990's   "3 spurs; ?14 staples" (12/30/2012)  . LEFT HEART CATHETERIZATION WITH CORONARY ANGIOGRAM N/A 12/31/2012   Procedure: LEFT HEART CATHETERIZATION WITH CORONARY ANGIOGRAM;  Surgeon: Tonny Bollman, MD;  Location: [facility] CATH LAB;  Service: Cardiovascular;  Laterality: N/A;  . LUMBAR DISC SURGERY  2010; 2011   "2 ruptured discs; 2 pinched nerves"  . SHOULDER SURGERY Right   . ULNAR  NERVE TRANSPOSITION Left 01/08/2018   Procedure: DECOMPRESSION LEFT ULNAR AT ELBOW;  Surgeon: Cindee Salt, MD;  Location: [facility];  Service: Orthopedics;  Laterality: Left;  axillary block    There were no vitals filed for this visit.   Subjective Assessment - 10/11/20 0732    Subjective COVID-19 screening performed upon arrival. Patient arrived with ongoing soreness although he did HEP 3x yesterday.    Pertinent History HTN, DM, CKD, history of Anginal pain, lumbar suregry.    Limitations Walking;Standing;House hold activities    How long can you stand comfortably? short periods    How long can you walk comfortably? short periods    Patient Stated Goals decrease pain and increase function (walking/standing).    Currently in Pain? Yes    Pain Score 5     Pain Location Knee    Pain Descriptors / Indicators Sore    Pain Type Acute pain    Pain Onset More than a month ago    Pain Frequency Constant              [facility] PT Assessment - 10/11/20 0001      Assessment   Medical Diagnosis Right pateela chondromalacia.    Referring Provider (PT) Royston Sinner Corrington MD    Onset Date/Surgical Date 01/25/20    Hand Dominance Right    Next MD Visit None  Precautions   Precautions Other (comment)    Precaution Comments Pain-free ther ex.      Restrictions   Weight Bearing Restrictions No                         [facility] Adult PT Treatment/Exercise - 10/11/20 0001      Knee/Hip Exercises: Stretches   Passive Hamstring Stretch Right;3 reps;30 seconds      Knee/Hip Exercises: Aerobic   Nustep L3 x21min UE/LE activity, monitored      Knee/Hip Exercises: Standing   Lateral Step Up Right;2 sets;10 reps;Hand Hold: 2;Step Height: 6"    Forward Step Up Right;2 sets;10 reps;Step Height: 6"    Rocker Board 2 minutes      Knee/Hip Exercises: Seated   Long Arc Quad Strengthening;Right;2 sets;10 reps;Weights    Long Arc Quad Weight 4 lbs.      Knee/Hip  Exercises: Supine   Straight Leg Raises Strengthening;Right;2 sets;10 reps      Modalities   Modalities Vasopneumatic      Vasopneumatic   Number Minutes Vasopneumatic  15 minutes    Vasopnuematic Location  Knee    Vasopneumatic Pressure Low    Vasopneumatic Temperature  34/soreness                       PT Long Term Goals - 10/09/20 0740      PT LONG TERM GOAL #1   Title Patient will be independent with HEP and progression.    Baseline issued HEP today 10/09/20    Time 6    Period Weeks    Status On-going      PT LONG TERM GOAL #2   Title Patient walk a community distance with pain not > 2-3/10.    Baseline Patient unable to tolerate community distance at this time 10/09/20    Time 6    Period Weeks    Status On-going      PT LONG TERM GOAL #3   Title Patient ambulate with normal right knee flexion during the swing phase.    Baseline ongoing deficts with ambulation 10/09/20    Time 6    Period Weeks    Status On-going      PT LONG TERM GOAL #4   Title Patient perform a reciprocating stair gait with one railing.    Baseline unable at this time 10/09/20    Time 6    Period Weeks    Status On-going                 Plan - 10/11/20 5465    Clinical Impression Statement Patient presented in clinic with reports of soreness in posterior knee. Although he reports doing HEP 3x day. Patient guided through light weightbearing therex with some discomfort at medial R knee with forward step ups. Patient educated to limit HEP to 2x per day to limit soreness. Patient's popliteal fossa is more palpably swollen than L popliteal fossa. Patient experiences muscle fatigue fairly quickly with SAQ as well. Normal vasopneumatic response noted following removal of the modality.    Personal Factors and Comorbidities Comorbidity 3+;Time since onset of injury/illness/exacerbation;Age;Other    Comorbidities HTN, DM, CKD, history of Anginal pain, lumbar surgery.     Examination-Activity Limitations Locomotion Level;Other    Examination-Participation Restrictions Other    Stability/Clinical Decision Making Stable/Uncomplicated    Rehab Potential Good    PT Frequency 2x / week    PT  Duration 6 weeks    PT Treatment/Interventions ADLs/Self Care Home Management;Cryotherapy;Electrical Stimulation;Iontophoresis 4mg /ml Dexamethasone;Moist Heat;Ultrasound;Functional mobility training;Therapeutic activities;Therapeutic exercise;Neuromuscular re-education;Manual techniques;Passive range of motion;Patient/family education;Vasopneumatic Device;Taping    PT Next Visit Plan cont with POC for Nustep with progression to stationary bike per patient tolerance, pain-free right LE ther ex.  Modalities as needed.    PT Home Exercise Plan see patient education section    Consulted and Agree with Plan of Care Patient           Patient will benefit from skilled therapeutic intervention in order to improve the following deficits and impairments:  Decreased activity tolerance,Decreased balance,Abnormal gait,Difficulty walking,Pain,Increased edema,Decreased strength,Decreased range of motion  Visit Diagnosis: Chronic pain of right knee  Localized edema  Difficulty in walking, not elsewhere classified  Muscle weakness (generalized)     Problem List Patient Active Problem List   Diagnosis Date Noted  . Guaiac positive stools 10/24/2016  . Precordial pain 12/30/2012  . HTN (hypertension)   . Diabetes mellitus type 2, noninsulin dependent (HCC)   . Hyperlipidemia   . Morbid obesity (HCC)   . CKD (chronic kidney disease) stage 3, GFR 30-59 ml/min (HCC)   . Anginal pain (HCC) 12/29/2012    12/31/2012, PTA 10/11/2020, 8:35 AM  [facility] [address] [place], [place], [place] Phone: [phone]   Fax:  [phone]  Name: [name] MRN: Ezzie Dural Date of Birth: [birth year]

## 2020-10-16 ENCOUNTER — Other Ambulatory Visit: Payer: Self-pay

## 2020-10-16 ENCOUNTER — Encounter: Payer: Self-pay | Admitting: Physical Therapy

## 2020-10-16 ENCOUNTER — Ambulatory Visit: Payer: Medicare Other | Attending: Family Medicine | Admitting: Physical Therapy

## 2020-10-16 DIAGNOSIS — M25561 Pain in right knee: Secondary | ICD-10-CM | POA: Insufficient documentation

## 2020-10-16 DIAGNOSIS — G8929 Other chronic pain: Secondary | ICD-10-CM | POA: Diagnosis present

## 2020-10-16 DIAGNOSIS — R262 Difficulty in walking, not elsewhere classified: Secondary | ICD-10-CM | POA: Diagnosis present

## 2020-10-16 DIAGNOSIS — M6281 Muscle weakness (generalized): Secondary | ICD-10-CM | POA: Diagnosis present

## 2020-10-16 DIAGNOSIS — R600 Localized edema: Secondary | ICD-10-CM | POA: Diagnosis present

## 2020-10-16 NOTE — Therapy (Signed)
[facility] [place], [place], [address] Phone: [phone]   Fax:  [phone]  Physical Therapy Treatment  Patient Details  Name: [name] MRN: [id number] Date of Birth: November 01, 1960 Referring Provider (PT): Ledora Bottcher MD   Encounter Date: 10/16/2020   PT End of Session - 10/16/20 0737    Visit Number 7    Number of Visits 12    Date for PT Re-Evaluation 12/21/20    Authorization Type Progress note every 10th visit; KX modifier at 15th visit    PT Start Time 0731    PT Stop Time 0814    PT Time Calculation (min) 43 min    Activity Tolerance Patient tolerated treatment well    Behavior During Therapy [facility] for tasks assessed/performed           Past Medical History:  Diagnosis Date  . Anginal pain ([place]) 12/29/2012  . Chronic low back pain   . CKD (chronic kidney disease) stage 3, GFR 30-59 ml/min (HCC)   . Diabetes mellitus type 2, noninsulin dependent ([place])   . Exertional shortness of breath   . GERD (gastroesophageal reflux disease)   . H/O hiatal hernia   . Headache(784.0)    "not daily, not weekly, but very often" (12/30/2012)  . HTN (hypertension)   . Hyperlipidemia   . Morbid obesity ([place])   . Osteoarthritis    "hands" (12/30/2012)    Past Surgical History:  Procedure Laterality Date  . CARPAL TUNNEL RELEASE Left 01/08/2018   Procedure: LEFT CARPAL TUNNEL RELEASE;  Surgeon: Daryll Brod, MD;  Location: [place];  Service: Orthopedics;  Laterality: Left;  axillary block  . ELBOW ARTHROPLASTY Left 1990's   "3 spurs; ?14 staples" (12/30/2012)  . LEFT HEART CATHETERIZATION WITH CORONARY ANGIOGRAM N/A 12/31/2012   Procedure: LEFT HEART CATHETERIZATION WITH CORONARY ANGIOGRAM;  Surgeon: Sherren Mocha, MD;  Location: [facility] CATH LAB;  Service: Cardiovascular;  Laterality: N/A;  . LUMBAR [place] SURGERY  2010; 2011   "2 ruptured discs; 2 pinched nerves"  . SHOULDER SURGERY Right   . ULNAR  NERVE TRANSPOSITION Left 01/08/2018   Procedure: DECOMPRESSION LEFT ULNAR AT ELBOW;  Surgeon: Daryll Brod, MD;  Location: [place];  Service: Orthopedics;  Laterality: Left;  axillary block    There were no vitals filed for this visit.   Subjective Assessment - 10/16/20 0737    Subjective COVID-19 screening performed upon arrival. Patient arrived    Pertinent History HTN, DM, CKD, history of Anginal pain, lumbar suregry.    Limitations Walking;Standing;House hold activities    How long can you stand comfortably? short periods    How long can you walk comfortably? short periods    Patient Stated Goals decrease pain and increase function (walking/standing).    Currently in Pain? Yes    Pain Score 4     Pain Location Knee    Pain Orientation Right    Pain Descriptors / Indicators Aching;Discomfort    Pain Type Acute pain    Pain Onset More than a month ago    Pain Frequency Constant              [facility] PT Assessment - 10/16/20 0001      Assessment   Medical Diagnosis Right pateela chondromalacia.    Referring Provider (PT) Talbert Forest Corrington MD    Onset Date/Surgical Date 01/25/20    Hand Dominance Right    Next MD Visit None  Precautions   Precautions Other (comment)    Precaution Comments Pain-free ther ex.      Restrictions   Weight Bearing Restrictions No                         [facility] Adult PT Treatment/Exercise - 10/16/20 0001      Knee/Hip Exercises: Aerobic   Nustep L4 x33min UE/LE activity, monitored      Knee/Hip Exercises: Standing   Terminal Knee Extension Strengthening;Right;2 sets;10 reps;Theraband    Theraband Level (Terminal Knee Extension) Level 2 (Red)    Hip Abduction Stengthening;Right;20 reps;Knee straight    Forward Step Up Right;2 sets;10 reps;Step Height: 6"    Rocker Board 2 minutes      Knee/Hip Exercises: Seated   Long Arc Quad Strengthening;Right;2 sets;10 reps;Weights    Long Arc Quad Weight 4 lbs.     Long [employer] Limitations with ball squeeze      Knee/Hip Exercises: Supine   Straight Leg Raises Strengthening;Right;2 sets;10 reps      Modalities   Modalities Vasopneumatic      Vasopneumatic   Number Minutes Vasopneumatic  15 minutes    Vasopnuematic Location  Knee    Vasopneumatic Pressure Low    Vasopneumatic Temperature  34/soreness                       PT Long Term Goals - 10/09/20 0740      PT LONG TERM GOAL #1   Title Patient will be independent with HEP and progression.    Baseline issued HEP today 10/09/20    Time 6    Period Weeks    Status On-going      PT LONG TERM GOAL #2   Title Patient walk a community distance with pain not > 2-3/10.    Baseline Patient unable to tolerate community distance at this time 10/09/20    Time 6    Period Weeks    Status On-going      PT LONG TERM GOAL #3   Title Patient ambulate with normal right knee flexion during the swing phase.    Baseline ongoing deficts with ambulation 10/09/20    Time 6    Period Weeks    Status On-going      PT LONG TERM GOAL #4   Title Patient perform a reciprocating stair gait with one railing.    Baseline unable at this time 10/09/20    Time 6    Period Weeks    Status On-going                 Plan - 10/16/20 8527    Clinical Impression Statement Patient presented in clinic with reports of ache of R knee which he correlated to cold, rain/snow mix weather. Patient limited himself to HEP twice daily over the weekend. Patient able to complete lightly resisted therex fairly well with reports of pain with LAQ/ball squeeze. Patient's R quad continues to fatigue quickly with SLR as well. Normal vasopneumatic response noted following removal of the modality.    Personal Factors and Comorbidities Comorbidity 3+;Time since onset of injury/illness/exacerbation;Age;Other    Comorbidities HTN, DM, CKD, history of Anginal pain, lumbar surgery.    Examination-Activity Limitations  Locomotion Level;Other    Examination-Participation Restrictions Other    Stability/Clinical Decision Making Stable/Uncomplicated    Rehab Potential Good    PT Frequency 2x / week    PT Duration 6 weeks  PT Treatment/Interventions ADLs/Self Care Home Management;Cryotherapy;Electrical Stimulation;Iontophoresis 4mg /ml Dexamethasone;Moist Heat;Ultrasound;Functional mobility training;Therapeutic activities;Therapeutic exercise;Neuromuscular re-education;Manual techniques;Passive range of motion;Patient/family education;Vasopneumatic Device;Taping    PT Next Visit Plan cont with POC for Nustep with progression to stationary bike per patient tolerance, pain-free right LE ther ex.  Modalities as needed.    PT Home Exercise Plan see patient education section    Consulted and Agree with Plan of Care Patient           Patient will benefit from skilled therapeutic intervention in order to improve the following deficits and impairments:  Decreased activity tolerance,Decreased balance,Abnormal gait,Difficulty walking,Pain,Increased edema,Decreased strength,Decreased range of motion  Visit Diagnosis: Chronic pain of right knee  Localized edema  Difficulty in walking, not elsewhere classified  Muscle weakness (generalized)     Problem List Patient Active Problem List   Diagnosis Date Noted  . Guaiac positive stools 10/24/2016  . Precordial pain 12/30/2012  . HTN (hypertension)   . Diabetes mellitus type 2, noninsulin dependent (HCC)   . Hyperlipidemia   . Morbid obesity (HCC)   . CKD (chronic kidney disease) stage 3, GFR 30-59 ml/min (HCC)   . Anginal pain (HCC) 12/29/2012    12/31/2012, PTA 10/16/2020, 8:35 AM  [facility] [address] [place], [place], [place] Phone: [phone]   Fax:  [phone]  Name: [name] MRN: Ezzie Dural Date of Birth: May 09, 1961

## 2020-10-18 ENCOUNTER — Other Ambulatory Visit: Payer: Self-pay

## 2020-10-18 ENCOUNTER — Ambulatory Visit: Payer: Medicare Other | Admitting: Physical Therapy

## 2020-10-18 ENCOUNTER — Encounter: Payer: Self-pay | Admitting: Physical Therapy

## 2020-10-18 DIAGNOSIS — R600 Localized edema: Secondary | ICD-10-CM

## 2020-10-18 DIAGNOSIS — R262 Difficulty in walking, not elsewhere classified: Secondary | ICD-10-CM

## 2020-10-18 DIAGNOSIS — M6281 Muscle weakness (generalized): Secondary | ICD-10-CM

## 2020-10-18 DIAGNOSIS — M25561 Pain in right knee: Secondary | ICD-10-CM | POA: Diagnosis not present

## 2020-10-18 DIAGNOSIS — G8929 Other chronic pain: Secondary | ICD-10-CM

## 2020-10-18 NOTE — Therapy (Signed)
[facility] [place], [place], [address] Phone: [phone]   Fax:  [phone]  Physical Therapy Treatment  Patient Details  Name: LEWAYNE PAULEY MRN: [id number] Date of Birth: [birth year] Referring Provider (PT): Ledora Bottcher MD   Encounter Date: 10/18/2020   PT End of Session - 10/18/20 0744    Visit Number 8    Number of Visits 12    Date for PT Re-Evaluation 12/21/20    Authorization Type Progress note every 10th visit; KX modifier at 15th visit    PT Start Time 0730    PT Stop Time 0814    PT Time Calculation (min) 44 min    Activity Tolerance Patient tolerated treatment well    Behavior During Therapy [facility] for tasks assessed/performed           Past Medical History:  Diagnosis Date  . Anginal pain ([place]) 12/29/2012  . Chronic low back pain   . CKD (chronic kidney disease) stage 3, GFR 30-59 ml/min (HCC)   . Diabetes mellitus type 2, noninsulin dependent ([place])   . Exertional shortness of breath   . GERD (gastroesophageal reflux disease)   . H/O hiatal hernia   . Headache(784.0)    "not daily, not weekly, but very often" (12/30/2012)  . HTN (hypertension)   . Hyperlipidemia   . Morbid obesity ([place])   . Osteoarthritis    "hands" (12/30/2012)    Past Surgical History:  Procedure Laterality Date  . CARPAL TUNNEL RELEASE Left 01/08/2018   Procedure: LEFT CARPAL TUNNEL RELEASE;  Surgeon: Daryll Brod, MD;  Location: [facility];  Service: Orthopedics;  Laterality: Left;  axillary block  . ELBOW ARTHROPLASTY Left 1990's   "3 spurs; ?14 staples" (12/30/2012)  . LEFT HEART CATHETERIZATION WITH CORONARY ANGIOGRAM N/A 12/31/2012   Procedure: LEFT HEART CATHETERIZATION WITH CORONARY ANGIOGRAM;  Surgeon: Sherren Mocha, MD;  Location: [facility] CATH LAB;  Service: Cardiovascular;  Laterality: N/A;  . LUMBAR [place] SURGERY  2010; 2011   "2 ruptured discs; 2 pinched nerves"  . SHOULDER SURGERY Right   . ULNAR  NERVE TRANSPOSITION Left 01/08/2018   Procedure: DECOMPRESSION LEFT ULNAR AT ELBOW;  Surgeon: Daryll Brod, MD;  Location: [facility];  Service: Orthopedics;  Laterality: Left;  axillary block    There were no vitals filed for this visit.   Subjective Assessment - 10/18/20 0742    Subjective COVID-19 screening performed upon arrival. Patient feeling "so-so"    Pertinent History HTN, DM, CKD, history of Anginal pain, lumbar suregry.    Limitations Walking;Standing;House hold activities    How long can you stand comfortably? short periods    How long can you walk comfortably? short periods    Patient Stated Goals decrease pain and increase function (walking/standing).    Currently in Pain? Yes    Pain Score 4     Pain Location Knee    Pain Orientation Right    Pain Descriptors / Indicators Aching;Discomfort    Pain Type Acute pain    Pain Onset More than a month ago    Pain Frequency Constant              [facility] PT Assessment - 10/18/20 0001      Assessment   Medical Diagnosis Right pateela chondromalacia.    Referring Provider (PT) Talbert Forest Corrington MD    Onset Date/Surgical Date 01/25/20    Hand Dominance Right    Next MD Visit None  Precautions   Precautions Other (comment)    Precaution Comments Pain-free ther ex.                         [facility] Adult PT Treatment/Exercise - 10/18/20 0001      Knee/Hip Exercises: Aerobic   Nustep L4 x87min UE/LE activity, monitored      Knee/Hip Exercises: Standing   Hip Abduction Stengthening;Right;Knee straight;3 sets;10 reps    Abduction Limitations attempted bilateral but with pain with single leg stance with UE support    Rocker Board 2 minutes      Knee/Hip Exercises: Seated   Long Arc Quad Strengthening;Right;2 sets;10 reps;Weights    Long Arc Quad Weight 4 lbs.    Long [employer] Limitations with ball squeeze    Hamstring Curl Strengthening;Right;20 reps    Hamstring Limitations red theraband     Sit to [employer] 2 sets;10 reps;with UE support   slight staggered stance for R LE strengthening     Knee/Hip Exercises: Supine   Short Arc Quad Sets Strengthening;Right;3 sets;10 reps    Short Arc Quad Sets Limitations 4#      Modalities   Modalities Vasopneumatic      Vasopneumatic   Number Minutes Vasopneumatic  15 minutes    Vasopnuematic Location  Knee    Vasopneumatic Pressure Low    Vasopneumatic Temperature  34/soreness                       PT Long Term Goals - 10/09/20 0740      PT LONG TERM GOAL #1   Title Patient will be independent with HEP and progression.    Baseline issued HEP today 10/09/20    Time 6    Period Weeks    Status On-going      PT LONG TERM GOAL #2   Title Patient walk a community distance with pain not > 2-3/10.    Baseline Patient unable to tolerate community distance at this time 10/09/20    Time 6    Period Weeks    Status On-going      PT LONG TERM GOAL #3   Title Patient ambulate with normal right knee flexion during the swing phase.    Baseline ongoing deficts with ambulation 10/09/20    Time 6    Period Weeks    Status On-going      PT LONG TERM GOAL #4   Title Patient perform a reciprocating stair gait with one railing.    Baseline unable at this time 10/09/20    Time 6    Period Weeks    Status On-going                 Plan - 10/18/20 0804    Clinical Impression Statement Patient arrived with ongoing R knee pain. Patient guided through TEs for LE strengthening with fair response. Patient attempted to perform L hip abduction for R SLS but unable secondary to pain. All other TEs were performed well. No adverse affects upon removal of vasopneumatic device.    Personal Factors and Comorbidities Comorbidity 3+;Time since onset of injury/illness/exacerbation;Age;Other    Comorbidities HTN, DM, CKD, history of Anginal pain, lumbar surgery.    Examination-Activity Limitations Locomotion Level;Other     Examination-Participation Restrictions Other    Stability/Clinical Decision Making Stable/Uncomplicated    Rehab Potential Good    PT Frequency 2x / week    PT Duration 6 weeks  PT Treatment/Interventions ADLs/Self Care Home Management;Cryotherapy;Electrical Stimulation;Iontophoresis 4mg /ml Dexamethasone;Moist Heat;Ultrasound;Functional mobility training;Therapeutic activities;Therapeutic exercise;Neuromuscular re-education;Manual techniques;Passive range of motion;Patient/family education;Vasopneumatic Device;Taping    PT Next Visit Plan cont with POC for Nustep with progression to stationary bike per patient tolerance, pain-free right LE ther ex.  Modalities as needed.    PT Home Exercise Plan see patient education section    Consulted and Agree with Plan of Care Patient           Patient will benefit from skilled therapeutic intervention in order to improve the following deficits and impairments:  Decreased activity tolerance,Decreased balance,Abnormal gait,Difficulty walking,Pain,Increased edema,Decreased strength,Decreased range of motion  Visit Diagnosis: Chronic pain of right knee  Localized edema  Difficulty in walking, not elsewhere classified  Muscle weakness (generalized)     Problem List Patient Active Problem List   Diagnosis Date Noted  . Guaiac positive stools 10/24/2016  . Precordial pain 12/30/2012  . HTN (hypertension)   . Diabetes mellitus type 2, noninsulin dependent (HCC)   . Hyperlipidemia   . Morbid obesity (HCC)   . CKD (chronic kidney disease) stage 3, GFR 30-59 ml/min (HCC)   . Anginal pain (HCC) 12/29/2012    12/31/2012, PT, DPT 10/18/2020, 8:09 AM  [facility] [address] [place], [place], [place] Phone: [phone]   Fax:  [phone]  Name: THELMA VIANA MRN: Ezzie Dural Date of Birth: 12-Aug-1961

## 2020-10-23 ENCOUNTER — Encounter: Payer: Self-pay | Admitting: Physical Therapy

## 2020-10-23 ENCOUNTER — Ambulatory Visit: Payer: Medicare Other | Admitting: Physical Therapy

## 2020-10-23 ENCOUNTER — Other Ambulatory Visit: Payer: Self-pay

## 2020-10-23 DIAGNOSIS — R262 Difficulty in walking, not elsewhere classified: Secondary | ICD-10-CM

## 2020-10-23 DIAGNOSIS — R600 Localized edema: Secondary | ICD-10-CM

## 2020-10-23 DIAGNOSIS — M6281 Muscle weakness (generalized): Secondary | ICD-10-CM

## 2020-10-23 DIAGNOSIS — G8929 Other chronic pain: Secondary | ICD-10-CM

## 2020-10-23 DIAGNOSIS — M25561 Pain in right knee: Secondary | ICD-10-CM | POA: Diagnosis not present

## 2020-10-23 NOTE — Therapy (Signed)
[facility] [address] [place], [place], [address] Phone: [phone]   Fax:  [phone]  Physical Therapy Treatment  Patient Details  Name: [name] MRN: [id number] Date of Birth: Mar 18, 1961 Referring Provider (PT): Delano Metz MD   Encounter Date: 10/23/2020   PT End of Session - 10/23/20 0740    Visit Number 9    Number of Visits 12    Date for PT Re-Evaluation 12/21/20    Authorization Type Progress note every 10th visit; KX modifier at 15th visit    PT Start Time 0730    PT Stop Time 0814    PT Time Calculation (min) 44 min    Activity Tolerance Patient tolerated treatment well    Behavior During Therapy [facility] for tasks assessed/performed           Past Medical History:  Diagnosis Date  . Anginal pain (HCC) 12/29/2012  . Chronic low back pain   . CKD (chronic kidney disease) stage 3, GFR 30-59 ml/min (HCC)   . Diabetes mellitus type 2, noninsulin dependent (HCC)   . Exertional shortness of breath   . GERD (gastroesophageal reflux disease)   . H/O hiatal hernia   . Headache(784.0)    "not daily, not weekly, but very often" (12/30/2012)  . HTN (hypertension)   . Hyperlipidemia   . Morbid obesity (HCC)   . Osteoarthritis    "hands" (12/30/2012)    Past Surgical History:  Procedure Laterality Date  . CARPAL TUNNEL RELEASE Left 01/08/2018   Procedure: LEFT CARPAL TUNNEL RELEASE;  Surgeon: Cindee Salt, MD;  Location: [facility];  Service: Orthopedics;  Laterality: Left;  axillary block  . ELBOW ARTHROPLASTY Left 1990's   "3 spurs; ?14 staples" (12/30/2012)  . LEFT HEART CATHETERIZATION WITH CORONARY ANGIOGRAM N/A 12/31/2012   Procedure: LEFT HEART CATHETERIZATION WITH CORONARY ANGIOGRAM;  Surgeon: Tonny Bollman, MD;  Location: [facility] CATH LAB;  Service: Cardiovascular;  Laterality: N/A;  . LUMBAR [place] SURGERY  2010; 2011   "2 ruptured discs; 2 pinched nerves"  . SHOULDER SURGERY Right   . ULNAR  NERVE TRANSPOSITION Left 01/08/2018   Procedure: DECOMPRESSION LEFT ULNAR AT ELBOW;  Surgeon: Cindee Salt, MD;  Location: [facility];  Service: Orthopedics;  Laterality: Left;  axillary block    There were no vitals filed for this visit.   Subjective Assessment - 10/23/20 0738    Subjective COVID-19 screening performed upon arrival. Patient reports right knee feeling "not bad"    Pertinent History HTN, DM, CKD, history of Anginal pain, lumbar suregry.    Limitations Walking;Standing;House hold activities    How long can you stand comfortably? short periods    How long can you walk comfortably? short periods    Patient Stated Goals decrease pain and increase function (walking/standing).    Currently in Pain? Yes   did not provide number on pain scale             [facility] PT Assessment - 10/23/20 0001      Assessment   Medical Diagnosis Right pateela chondromalacia.    Referring Provider (PT) Royston Sinner Corrington MD    Onset Date/Surgical Date 01/25/20    Hand Dominance Right    Next MD Visit None      Precautions   Precautions Other (comment)    Precaution Comments Pain-free ther ex.  [facility] Adult PT Treatment/Exercise - 10/23/20 0001      Knee/Hip Exercises: Aerobic   Nustep L5 x85min UE/LE activity, monitored      Knee/Hip Exercises: Machines for Strengthening   Cybex Knee Extension 10# 2x10    Cybex Knee Flexion 20# 2x10      Knee/Hip Exercises: Standing   Forward Step Up Right;2 sets;10 reps;Step Height: 6"    Rocker Board 4 minutes      Knee/Hip Exercises: Supine   Straight Leg Raises Strengthening;Right;2 sets;10 reps      Modalities   Modalities Vasopneumatic      Vasopneumatic   Number Minutes Vasopneumatic  15 minutes    Vasopnuematic Location  Knee    Vasopneumatic Pressure Low    Vasopneumatic Temperature  34/soreness                       PT Long Term Goals - 10/09/20 0740      PT LONG  TERM GOAL #1   Title Patient will be independent with HEP and progression.    Baseline issued HEP today 10/09/20    Time 6    Period Weeks    Status On-going      PT LONG TERM GOAL #2   Title Patient walk a community distance with pain not > 2-3/10.    Baseline Patient unable to tolerate community distance at this time 10/09/20    Time 6    Period Weeks    Status On-going      PT LONG TERM GOAL #3   Title Patient ambulate with normal right knee flexion during the swing phase.    Baseline ongoing deficts with ambulation 10/09/20    Time 6    Period Weeks    Status On-going      PT LONG TERM GOAL #4   Title Patient perform a reciprocating stair gait with one railing.    Baseline unable at this time 10/09/20    Time 6    Period Weeks    Status On-going                 Plan - 10/23/20 0757    Clinical Impression Statement Patient responded well to the progression of strengthening exercises. Patient was able to perform all exercises with good technique. Patient denied any significant increase of pain throughout TEs. No adverse affects upon removal of modalities.    Personal Factors and Comorbidities Comorbidity 3+;Time since onset of injury/illness/exacerbation;Age;Other    Comorbidities HTN, DM, CKD, history of Anginal pain, lumbar surgery.    Examination-Activity Limitations Locomotion Level;Other    Examination-Participation Restrictions Other    Stability/Clinical Decision Making Stable/Uncomplicated    Rehab Potential Good    PT Frequency 2x / week    PT Duration 6 weeks    PT Treatment/Interventions ADLs/Self Care Home Management;Cryotherapy;Electrical Stimulation;Iontophoresis 4mg /ml Dexamethasone;Moist Heat;Ultrasound;Functional mobility training;Therapeutic activities;Therapeutic exercise;Neuromuscular re-education;Manual techniques;Passive range of motion;Patient/family education;Vasopneumatic Device;Taping    PT Next Visit Plan Progress note; cont with POC for  Nustep with progression to stationary bike per patient tolerance, pain-free right LE ther ex.  Modalities as needed.    PT Home Exercise Plan see patient education section    Consulted and Agree with Plan of Care Patient           Patient will benefit from skilled therapeutic intervention in order to improve the following deficits and impairments:  Decreased activity tolerance,Decreased balance,Abnormal gait,Difficulty walking,Pain,Increased edema,Decreased strength,Decreased range of motion  Visit Diagnosis:  Chronic pain of right knee  Localized edema  Difficulty in walking, not elsewhere classified  Muscle weakness (generalized)     Problem List Patient Active Problem List   Diagnosis Date Noted  . Guaiac positive stools 10/24/2016  . Precordial pain 12/30/2012  . HTN (hypertension)   . Diabetes mellitus type 2, noninsulin dependent (HCC)   . Hyperlipidemia   . Morbid obesity (HCC)   . CKD (chronic kidney disease) stage 3, GFR 30-59 ml/min (HCC)   . Anginal pain (HCC) 12/29/2012    Guss Bunde, PT, DPT 10/23/2020, 8:22 AM  [facility] [address] [place], [place], [address] Phone: [phone]   Fax:  [phone]  Name: [name] MRN: [id number] Date of Birth: July 16, 1961

## 2020-10-25 ENCOUNTER — Other Ambulatory Visit: Payer: Self-pay

## 2020-10-25 ENCOUNTER — Ambulatory Visit: Payer: Medicare Other | Admitting: Physical Therapy

## 2020-10-25 DIAGNOSIS — M25561 Pain in right knee: Secondary | ICD-10-CM

## 2020-10-25 DIAGNOSIS — R600 Localized edema: Secondary | ICD-10-CM

## 2020-10-25 DIAGNOSIS — R262 Difficulty in walking, not elsewhere classified: Secondary | ICD-10-CM

## 2020-10-25 DIAGNOSIS — M6281 Muscle weakness (generalized): Secondary | ICD-10-CM

## 2020-10-25 DIAGNOSIS — G8929 Other chronic pain: Secondary | ICD-10-CM

## 2020-10-25 NOTE — Therapy (Addendum)
[facility] [place], [place], [address] Phone: [phone]   Fax:  [phone]  Physical Therapy Treatment  Progress Note Reporting Period 09/22/2020 to 10/25/2020  See note below for Objective Data and Assessment of Progress/Goals. Patient is making progress towards goals with decreased pain in R knee. Gabriela Eves, PT, DPT    Patient Details  Name: [name] MRN: [id number] Date of Birth: Oct 20, 1960 Referring Provider (PT): Ledora Bottcher MD   Encounter Date: 10/25/2020   PT End of Session - 10/25/20 0740    Visit Number 10    Number of Visits 12    Date for PT Re-Evaluation 12/21/20    Authorization Type Progress note every 10th visit; KX modifier at 15th visit    PT Start Time 0730    PT Stop Time 0814    PT Time Calculation (min) 44 min    Activity Tolerance Patient tolerated treatment well    Behavior During Therapy [facility] for tasks assessed/performed           Past Medical History:  Diagnosis Date  . Anginal pain ([place]) 12/29/2012  . Chronic low back pain   . CKD (chronic kidney disease) stage 3, GFR 30-59 ml/min (HCC)   . Diabetes mellitus type 2, noninsulin dependent ([place])   . Exertional shortness of breath   . GERD (gastroesophageal reflux disease)   . H/O hiatal hernia   . Headache(784.0)    "not daily, not weekly, but very often" (12/30/2012)  . HTN (hypertension)   . Hyperlipidemia   . Morbid obesity ([place])   . Osteoarthritis    "hands" (12/30/2012)    Past Surgical History:  Procedure Laterality Date  . CARPAL TUNNEL RELEASE Left 01/08/2018   Procedure: LEFT CARPAL TUNNEL RELEASE;  Surgeon: Daryll Brod, MD;  Location: [facility];  Service: Orthopedics;  Laterality: Left;  axillary block  . ELBOW ARTHROPLASTY Left 1990's   "3 spurs; ?14 staples" (12/30/2012)  . LEFT HEART CATHETERIZATION WITH CORONARY ANGIOGRAM N/A 12/31/2012   Procedure: LEFT HEART CATHETERIZATION WITH  CORONARY ANGIOGRAM;  Surgeon: Sherren Mocha, MD;  Location: [facility] CATH LAB;  Service: Cardiovascular;  Laterality: N/A;  . LUMBAR [place] SURGERY  2010; 2011   "2 ruptured discs; 2 pinched nerves"  . SHOULDER SURGERY Right   . ULNAR NERVE TRANSPOSITION Left 01/08/2018   Procedure: DECOMPRESSION LEFT ULNAR AT ELBOW;  Surgeon: Daryll Brod, MD;  Location: [facility];  Service: Orthopedics;  Laterality: Left;  axillary block    There were no vitals filed for this visit.   Subjective Assessment - 10/25/20 0739    Subjective COVID-19 screening performed upon arrival. Patient reported no pain.    Pertinent History HTN, DM, CKD, history of Anginal pain, lumbar suregry.    Limitations Walking;Standing;House hold activities    How long can you stand comfortably? short periods    How long can you walk comfortably? short periods    Patient Stated Goals decrease pain and increase function (walking/standing).    Currently in Pain? No/denies                             [facility] Adult PT Treatment/Exercise - 10/25/20 0001      Knee/Hip Exercises: Aerobic   Nustep L5 x64mn UE/LE activity, monitored      Knee/Hip Exercises: Machines for Strengthening   Cybex Knee Extension 10# 3x10    Cybex Knee Flexion 20# 3x10  Knee/Hip Exercises: Standing   Terminal Knee Extension Strengthening;Right;10 reps;Theraband;3 sets    Theraband Level (Terminal Knee Extension) Other (comment)    Terminal Knee Extension Limitations blue XTS    Hip Abduction Limitations    Abduction Limitations unable to perfrom due to difficulty with SLS    Forward Step Up Right;10 reps;Step Height: 6";3 sets      Vasopneumatic   Number Minutes Vasopneumatic  15 minutes    Vasopnuematic Location  Knee    Vasopneumatic Pressure Low    Vasopneumatic Temperature  34/soreness                       PT Long Term Goals - 10/25/20 0741      PT LONG TERM GOAL #1   Title Patient will be  independent with HEP and progression.    Baseline Met 10/26/19    Time 6    Period Weeks    Status Achieved      PT LONG TERM GOAL #2   Title Patient walk a community distance with pain not > 2-3/10.    Baseline Patient walked about 5 min a few times a week, yet not walking a communuity distance 10/25/20    Time 6    Period Weeks    Status On-going      PT LONG TERM GOAL #3   Title Patient ambulate with normal right knee flexion during the swing phase.    Baseline ongoing slight deficts with ambulation 10/25/20    Time 6    Period Weeks    Status On-going      PT LONG TERM GOAL #4   Title Patient perform a reciprocating stair gait with one railing.    Baseline doing well with a reciprocating gait 10/25/20    Time 6    Period Weeks    Status Achieved                 Plan - 10/25/20 2878    Clinical Impression Statement Patient tolerated treatment well today. Patient has overall improved with less pain and able to perform ADL's with greater ease. Patient reported no pain today and has started a light walking program. Patient has not been able to walk a community distance but able to walk 2mn at a time. Patient doing well with HEP and has Met LTG#1 and #4 today with remaing progressing.    Personal Factors and Comorbidities Comorbidity 3+;Time since onset of injury/illness/exacerbation;Age;Other    Comorbidities HTN, DM, CKD, history of Anginal pain, lumbar surgery.    Examination-Activity Limitations Locomotion Level;Other    Examination-Participation Restrictions Other    Stability/Clinical Decision Making Stable/Uncomplicated    Rehab Potential Good    PT Frequency 2x / week    PT Duration 6 weeks    PT Treatment/Interventions ADLs/Self Care Home Management;Cryotherapy;Electrical Stimulation;Iontophoresis 467mml Dexamethasone;Moist Heat;Ultrasound;Functional mobility training;Therapeutic activities;Therapeutic exercise;Neuromuscular re-education;Manual techniques;Passive  range of motion;Patient/family education;Vasopneumatic Device;Taping    PT Next Visit Plan cont with POC pain-free right LE ther ex.  DC next week    Consulted and Agree with Plan of Care Patient           Patient will benefit from skilled therapeutic intervention in order to improve the following deficits and impairments:  Decreased activity tolerance,Decreased balance,Abnormal gait,Difficulty walking,Pain,Increased edema,Decreased strength,Decreased range of motion  Visit Diagnosis: Chronic pain of right knee  Localized edema  Difficulty in walking, not elsewhere classified  Muscle weakness (generalized)     Problem  List Patient Active Problem List   Diagnosis Date Noted  . Guaiac positive stools 10/24/2016  . Precordial pain 12/30/2012  . HTN (hypertension)   . Diabetes mellitus type 2, noninsulin dependent ([place])   . Hyperlipidemia   . Morbid obesity ([place])   . CKD (chronic kidney disease) stage 3, GFR 30-59 ml/min (HCC)   . Anginal pain [facility]) 12/29/2012    Ladean Raya, PTA 10/25/20 8:33 AM   [facility] [address] [place], [place], [address] Phone: [phone]   Fax:  [phone]  Name: [name] MRN: [id number] Date of Birth: Apr 24, 1961

## 2020-10-30 ENCOUNTER — Ambulatory Visit: Payer: Medicare Other | Admitting: Physical Therapy

## 2020-11-01 ENCOUNTER — Encounter: Payer: Medicare Other | Admitting: Physical Therapy

## 2020-11-06 ENCOUNTER — Encounter: Payer: Self-pay | Admitting: Physical Therapy

## 2020-11-06 ENCOUNTER — Ambulatory Visit: Payer: Medicare Other | Admitting: Physical Therapy

## 2020-11-06 ENCOUNTER — Other Ambulatory Visit: Payer: Self-pay

## 2020-11-06 DIAGNOSIS — G8929 Other chronic pain: Secondary | ICD-10-CM

## 2020-11-06 DIAGNOSIS — M25561 Pain in right knee: Secondary | ICD-10-CM

## 2020-11-06 DIAGNOSIS — R262 Difficulty in walking, not elsewhere classified: Secondary | ICD-10-CM

## 2020-11-06 DIAGNOSIS — M6281 Muscle weakness (generalized): Secondary | ICD-10-CM

## 2020-11-06 DIAGNOSIS — R600 Localized edema: Secondary | ICD-10-CM

## 2020-11-06 NOTE — Therapy (Addendum)
[facility] [place], [place], [address] Phone: [phone]   Fax:  [phone]  Physical Therapy Treatment PHYSICAL THERAPY DISCHARGE SUMMARY  Visits from Start of Care: 11  Current functional level related to goals / functional outcomes: See below   Remaining deficits: See goals   Education / Equipment: HEP Plan: Patient agrees to discharge.  Patient goals were partially met. Patient is being discharged due to being pleased with the current functional level.  ?????     Patient Details  Name: [name] MRN: [id number] Date of Birth: 12-10-1960 Referring Provider (PT): Ledora Bottcher MD   Encounter Date: 11/06/2020   PT End of Session - 11/06/20 0737    Visit Number 11    Number of Visits 12    Date for PT Re-Evaluation 12/21/20    Authorization Type Progress note every 10th visit; KX modifier at 15th visit    PT Start Time 0730    PT Stop Time 0815    PT Time Calculation (min) 45 min    Activity Tolerance Patient tolerated treatment well    Behavior During Therapy [place] for tasks assessed/performed           Past Medical History:  Diagnosis Date  . Anginal pain ([place]) 12/29/2012  . Chronic low back pain   . CKD (chronic kidney disease) stage 3, GFR 30-59 ml/min (HCC)   . Diabetes mellitus type 2, noninsulin dependent ([place])   . Exertional shortness of breath   . GERD (gastroesophageal reflux disease)   . H/O hiatal hernia   . Headache(784.0)    "not daily, not weekly, but very often" (12/30/2012)  . HTN (hypertension)   . Hyperlipidemia   . Morbid obesity ([place])   . Osteoarthritis    "hands" (12/30/2012)    Past Surgical History:  Procedure Laterality Date  . CARPAL TUNNEL RELEASE Left 01/08/2018   Procedure: LEFT CARPAL TUNNEL RELEASE;  Surgeon: Daryll Brod, MD;  Location: [facility];  Service: Orthopedics;  Laterality: Left;  axillary block  . ELBOW ARTHROPLASTY Left 1990's    "3 spurs; ?14 staples" (12/30/2012)  . LEFT HEART CATHETERIZATION WITH CORONARY ANGIOGRAM N/A 12/31/2012   Procedure: LEFT HEART CATHETERIZATION WITH CORONARY ANGIOGRAM;  Surgeon: Sherren Mocha, MD;  Location: [facility] CATH LAB;  Service: Cardiovascular;  Laterality: N/A;  . LUMBAR [place] SURGERY  2010; 2011   "2 ruptured discs; 2 pinched nerves"  . SHOULDER SURGERY Right   . ULNAR NERVE TRANSPOSITION Left 01/08/2018   Procedure: DECOMPRESSION LEFT ULNAR AT ELBOW;  Surgeon: Daryll Brod, MD;  Location: [facility];  Service: Orthopedics;  Laterality: Left;  axillary block    There were no vitals filed for this visit.   Subjective Assessment - 11/06/20 0736    Subjective COVID-19 screening performed upon arrival. Patient reported about 3/10.    Pertinent History HTN, DM, CKD, history of Anginal pain, lumbar suregry.    Limitations Walking;Standing;House hold activities    How long can you stand comfortably? short periods    How long can you walk comfortably? short periods    Patient Stated Goals decrease pain and increase function (walking/standing).    Currently in Pain? No/denies    Pain Score 3     Pain Location Knee    Pain Orientation Right    Pain Descriptors / Indicators Aching;Discomfort    Pain Type Acute pain    Pain Onset More than a month ago    Pain Frequency  Constant              [facility] PT Assessment - 11/06/20 0001      Assessment   Medical Diagnosis Right pateela chondromalacia.    Referring Provider (PT) Talbert Forest Corrington MD    Onset Date/Surgical Date 01/25/20    Hand Dominance Right    Next MD Visit None      Precautions   Precautions Other (comment)    Precaution Comments Pain-free ther ex.                         [facility] Adult PT Treatment/Exercise - 11/06/20 0001      Knee/Hip Exercises: Aerobic   Nustep L6 x48mn UE/LE activity, monitored      Knee/Hip Exercises: Machines for Strengthening   Cybex Knee Extension 10# 3x10    Cybex  Knee Flexion 20# 3x10      Knee/Hip Exercises: Standing   Heel Raises Both;3 sets;10 reps    Heel Raises Limitations toe raises 3x10    Forward Step Up --      Knee/Hip Exercises: Supine   Straight Leg Raises Strengthening;Right;3 sets;10 reps      Modalities   Modalities Vasopneumatic      Vasopneumatic   Number Minutes Vasopneumatic  15 minutes    Vasopnuematic Location  Knee    Vasopneumatic Pressure Low    Vasopneumatic Temperature  34/soreness                       PT Long Term Goals - 10/25/20 0741      PT LONG TERM GOAL #1   Title Patient will be independent with HEP and progression.    Baseline Met 10/26/19    Time 6    Period Weeks    Status Achieved      PT LONG TERM GOAL #2   Title Patient walk a community distance with pain not > 2-3/10.    Baseline Patient walked about 5 min a few times a week, yet not walking a communuity distance 10/25/20    Time 6    Period Weeks    Status On-going      PT LONG TERM GOAL #3   Title Patient ambulate with normal right knee flexion during the swing phase.    Baseline ongoing slight deficts with ambulation 10/25/20    Time 6    Period Weeks    Status On-going      PT LONG TERM GOAL #4   Title Patient perform a reciprocating stair gait with one railing.    Baseline doing well with a reciprocating gait 10/25/20    Time 6    Period Weeks    Status Achieved                 Plan - 11/06/20 0755    Clinical Impression Statement Patient responded well to therapy session with minimal reports of increased pain. Patient able to complete TEs with good technique and pacing. Patient still with deceased R stance time during gait but overall reports a slow improvement in walking. Patient has one visit remaning then DC. No adverse affects upon removal of modalities.    Personal Factors and Comorbidities Comorbidity 3+;Time since onset of injury/illness/exacerbation;Age;Other    Comorbidities HTN, DM, CKD, history of  Anginal pain, lumbar surgery.    Examination-Activity Limitations Locomotion Level;Other    Examination-Participation Restrictions Other    Stability/Clinical Decision Making Stable/Uncomplicated  Rehab Potential Good    PT Frequency 2x / week    PT Duration 6 weeks    PT Treatment/Interventions ADLs/Self Care Home Management;Cryotherapy;Electrical Stimulation;Iontophoresis 56m/ml Dexamethasone;Moist Heat;Ultrasound;Functional mobility training;Therapeutic activities;Therapeutic exercise;Neuromuscular re-education;Manual techniques;Passive range of motion;Patient/family education;Vasopneumatic Device;Taping    PT Next Visit Plan DC next visit    PT Home Exercise Plan see patient education section    Consulted and Agree with Plan of Care Patient           Patient will benefit from skilled therapeutic intervention in order to improve the following deficits and impairments:  Decreased activity tolerance,Decreased balance,Abnormal gait,Difficulty walking,Pain,Increased edema,Decreased strength,Decreased range of motion  Visit Diagnosis: Chronic pain of right knee  Localized edema  Difficulty in walking, not elsewhere classified  Muscle weakness (generalized)     Problem List Patient Active Problem List   Diagnosis Date Noted  . Guaiac positive stools 10/24/2016  . Precordial pain 12/30/2012  . HTN (hypertension)   . Diabetes mellitus type 2, noninsulin dependent ([place]   . Hyperlipidemia   . [place]bid [place] ([place]   . CKD (chronic kidney disease) stage 3, GFR 30-59 ml/min (HCC)   . Anginal pain ([place] 12/29/2012    KGabriela Eves PT, DPT 11/06/2020, 8:24 AM  [facility] [place] [address]Phone: [phone]  Fax:  [phone] Name: Russell ZENZMRN: [id number] of Birth: 105/20/1962

## 2020-11-08 ENCOUNTER — Encounter: Payer: Medicare Other | Admitting: Physical Therapy

## 2021-04-04 ENCOUNTER — Ambulatory Visit: Payer: Self-pay | Admitting: Orthopedic Surgery

## 2021-04-11 NOTE — Patient Instructions (Addendum)
DUE TO COVID-19 ONLY ONE VISITOR IS ALLOWED TO COME WITH YOU AND STAY IN THE WAITING ROOM ONLY DURING PRE OP AND PROCEDURE DAY OF SURGERY. THE 1 VISITOR  MAY VISIT WITH YOU AFTER SURGERY IN YOUR PRIVATE ROOM DURING VISITING HOURS ONLY!                [name]     Your procedure is scheduled on: 04/18/21   [facility]   Report to admitting at 9:35AM     Call this number if you have problems the morning of surgery [phone]    BRUSH YOUR TEETH MORNING OF SURGERY AND RINSE YOUR MOUTH OUT, NO CHEWING GUM CANDY OR MINTS.   No food after midnight.    You may have clear liquid until 8:30 AM.    At 8:00AM drink pre surgery drink.   Nothing by mouth after 4830 AM.    Take these medicines the morning of surgery with A SIP OF WATER:  Omeprazole        How to Manage Your Diabetes Before and After Surgery  Why is it important to control my blood sugar before and after surgery? Improving blood sugar levels before and after surgery helps healing and can limit problems. A way of improving blood sugar control is eating a healthy diet by:  Eating less sugar and carbohydrates  Increasing activity/exercise  Talking with your doctor about reaching your blood sugar goals High blood sugars (greater than 180 mg/dL) can raise your risk of infections and slow your recovery, so you will need to focus on controlling your diabetes during the weeks before surgery. Make sure that the doctor who takes care of your diabetes knows about your planned surgery including the date and location.  How do I manage my blood sugar before surgery? Check your blood sugar at least 4 times a day, starting 2 days before surgery, to make sure that the level is not too high or low. Check your blood sugar the morning of your surgery when you wake up and every 2 hours until you get to the Short Stay unit. If your blood sugar is less than 70 mg/dL, you will need to treat for low blood  sugar: Do not take insulin. Treat a low blood sugar (less than 70 mg/dL) with  cup of clear juice (cranberry or apple), 4 glucose tablets, OR glucose gel. Recheck blood sugar in 15 minutes after treatment (to make sure it is greater than 70 mg/dL). If your blood sugar is not greater than 70 mg/dL on recheck, call [phone] for further instructions. Report your blood sugar to the short stay nurse when you get to Short Stay.  If you are admitted to the hospital after surgery: Your blood sugar will be checked by the staff and you will probably be given insulin after surgery (instead of oral diabetes medicines) to make sure you have good blood sugar levels. The goal for blood sugar control after surgery is 80-180 mg/dL.   WHAT DO I DO ABOUT MY DIABETES MEDICATION?  Do not take Jardiance the day before surgery  Do not take oral diabetes medicines (pills) the morning of surgery.  THE NIGHT BEFORE SURGERY, take no  insulin.       THE MORNING OF SURGERY, take 40  units of  insulin.  You may not have any metal on your body including              piercings  Do not wear jewelry, lotions, powders or deodorant                         Men may shave face and neck.   Do not bring valuables to the hospital. [facility] IS NOT             RESPONSIBLE   FOR VALUABLES.  Contacts, dentures or bridgework may not be worn into surgery.       Patients discharged the day of surgery will not be allowed to drive home.   IF YOU ARE HAVING SURGERY AND GOING HOME THE SAME DAY, YOU MUST HAVE AN ADULT TO DRIVE YOU HOME AND BE WITH YOU FOR 24 HOURS.   YOU MAY GO HOME BY TAXI OR UBER OR ORTHERWISE, BUT AN ADULT MUST ACCOMPANY YOU HOME AND STAY WITH YOU FOR 24 HOURS.  Name and phone number of your driver:  Special Instructions: N/A              Please read over the following fact sheets you were  given: _____________________________________________________________________             [facility] - Preparing for Surgery Before surgery, you can play an important role.  Because skin is not sterile, your skin needs to be as free of germs as possible.  You can reduce the number of germs on your skin by washing with CHG (chlorahexidine gluconate) soap before surgery.  CHG is an antiseptic cleaner which kills germs and bonds with the skin to continue killing germs even after washing. Please DO NOT use if you have an allergy to CHG or antibacterial soaps.  If your skin becomes reddened/irritated stop using the CHG and inform your nurse when you arrive at Short Stay.   You may shave your face/neck. Please follow these instructions carefully:  1.  Shower with CHG Soap the night before surgery and the  morning of Surgery.  2.  If you choose to wash your hair, wash your hair first as usual with your  normal  shampoo.  3.  After you shampoo, rinse your hair and body thoroughly to remove the  shampoo.                                        4.  Use CHG as you would any other liquid soap.  You can apply chg directly  to the skin and wash                       Gently with a scrungie or clean washcloth.  5.  Apply the CHG Soap to your body ONLY FROM THE NECK DOWN.   Do not use on face/ open                           Wound or open sores. Avoid contact with eyes, ears mouth and genitals (private parts).                       Wash face,  Genitals (private parts) with your normal soap.             6.  Wash thoroughly, paying special attention to the area where your surgery  will be performed.  7.  Thoroughly rinse your body with warm water from the neck down.  8.  DO NOT shower/wash with your normal soap after using and rinsing off  the CHG Soap.             9.  Pat yourself dry with a clean towel.            10.  Wear clean pajamas.            11.  Place clean sheets on your bed the night of your first  shower and do not  sleep with pets. Day of Surgery : Do not apply any lotions/deodorants the morning of surgery.  Please wear clean clothes to the hospital/surgery center.  FAILURE TO FOLLOW THESE INSTRUCTIONS MAY RESULT IN THE CANCELLATION OF YOUR SURGERY PATIENT SIGNATURE_________________________________  NURSE SIGNATURE__________________________________  ________________________________________________________________________   [name]  An incentive spirometer is a tool that can help keep your lungs clear and active. This tool measures how well you are filling your lungs with each breath. Taking long deep breaths may help reverse or decrease the chance of developing breathing (pulmonary) problems (especially infection) following: A long period of time when you are unable to move or be active. BEFORE THE PROCEDURE  If the spirometer includes an indicator to show your best effort, your nurse or respiratory therapist will set it to a desired goal. If possible, sit up straight or lean slightly forward. Try not to slouch. Hold the incentive spirometer in an upright position. INSTRUCTIONS FOR USE  Sit on the edge of your bed if possible, or sit up as far as you can in bed or on a chair. Hold the incentive spirometer in an upright position. Breathe out normally. Place the mouthpiece in your mouth and seal your lips tightly around it. Breathe in slowly and as deeply as possible, raising the piston or the ball toward the top of the column. Hold your breath for 3-5 seconds or for as long as possible. Allow the piston or ball to fall to the bottom of the column. Remove the mouthpiece from your mouth and breathe out normally. Rest for a few seconds and repeat Steps 1 through 7 at least 10 times every 1-2 hours when you are awake. Take your time and take a few normal breaths between deep breaths. The spirometer may include an indicator to show your best effort. Use the indicator as a  goal to work toward during each repetition. After each set of 10 deep breaths, practice coughing to be sure your lungs are clear. If you have an incision (the cut made at the time of surgery), support your incision when coughing by placing a pillow or rolled up towels firmly against it. Once you are able to get out of bed, walk around indoors and cough well. You may stop using the incentive spirometer when instructed by your caregiver.  RISKS AND COMPLICATIONS Take your time so you do not get dizzy or light-headed. If you are in pain, you may need to take or ask for pain medication before doing incentive spirometry. It is harder to take a deep breath if you are having pain. AFTER USE Rest and breathe slowly and easily. It can be helpful to keep track of a log of your progress. Your caregiver can provide you with a simple table to help with this. If you are using the spirometer at home,  follow these instructions: [place] IF:  You are having difficultly using the spirometer. You have trouble using the spirometer as often as instructed. Your pain medication is not giving enough relief while using the spirometer. You develop fever of 100.5 F (38.1 C) or higher. SEEK IMMEDIATE MEDICAL CARE IF:  You cough up bloody sputum that had not been present before. You develop fever of 102 F (38.9 C) or greater. You develop worsening pain at or near the incision site. MAKE SURE YOU:  Understand these instructions. Will watch your condition. Will get help right away if you are not doing well or get worse. Document Released: 02/10/2007 Document Revised: 12/23/2011 Document Reviewed: 04/13/2007 [facility] Patient Information 2014 [place], [place].   ________________________________________________________________________

## 2021-04-12 ENCOUNTER — Encounter (HOSPITAL_COMMUNITY): Payer: Self-pay

## 2021-04-12 ENCOUNTER — Other Ambulatory Visit: Payer: Self-pay

## 2021-04-12 ENCOUNTER — Encounter (HOSPITAL_COMMUNITY)
Admission: RE | Admit: 2021-04-12 | Discharge: 2021-04-12 | Disposition: A | Discharge status: Home / Self Care | Payer: Medicare Other | Source: Ambulatory Visit | Attending: Specialist | Admitting: Specialist

## 2021-04-12 DIAGNOSIS — Z01818 Encounter for other preprocedural examination: Secondary | ICD-10-CM | POA: Diagnosis not present

## 2021-04-12 LAB — GLUCOSE, CAPILLARY: Glucose-Capillary: 147 mg/dL — ABNORMAL HIGH (ref 70–99)

## 2021-04-12 LAB — BASIC METABOLIC PANEL
Anion gap: 10 (ref 5–15)
BUN: 34 mg/dL — ABNORMAL HIGH (ref 8–23)
CO2: 21 mmol/L — ABNORMAL LOW (ref 22–32)
Calcium: 9.7 mg/dL (ref 8.9–10.3)
Chloride: 106 mmol/L (ref 98–111)
Creatinine, Ser: 1.65 mg/dL — ABNORMAL HIGH (ref 0.61–1.24)
GFR, Estimated: 47 mL/min — ABNORMAL LOW (ref >60)
Glucose, Bld: 134 mg/dL — ABNORMAL HIGH (ref 70–99)
Potassium: 4.3 mmol/L (ref 3.5–5.1)
Sodium: 137 mmol/L (ref 135–145)

## 2021-04-12 LAB — CBC
HCT: 44 % (ref 39.0–52.0)
Hemoglobin: 14.6 g/dL (ref 13.0–17.0)
MCH: 29.9 pg (ref 26.0–34.0)
MCHC: 33.2 g/dL (ref 30.0–36.0)
MCV: 90 fL (ref 80.0–100.0)
Platelets: 240 10*3/uL (ref 150–400)
RBC: 4.89 MIL/uL (ref 4.22–5.81)
RDW: 14.3 % (ref 11.5–15.5)
WBC: 10.2 10*3/uL (ref 4.0–10.5)
nRBC: 0 % (ref 0.0–0.2)

## 2021-04-12 NOTE — Progress Notes (Addendum)
COVID Vaccine Completed:Yes Date COVID Vaccine completed:06/30/20 COVID vaccine manufacturer:  Moderna    PCP - Dr. Royston Sinner Corrington LOV 03/20/21 Cardiologist - Dr. Demetrius Charity. [place]  LOV 12/30/12. No longer sees Endocranologist- Dr. Shawnee Knapp LOV 03/14/21  Chest x-ray - no EKG - 04/12/21-chart Stress Test - no. Pt said they couldn't finish the stress test and he had the cardiac cath with Dr. Dayle Points. ECHO - no Cardiac Cath - 01/10/2013-epic Pacemaker/ICD device last checked:NA  Sleep Study - no Stop bang score was 6. PCP notified CPAP - no  Fasting Blood Sugar - 100-145 Checks Blood Sugar __QD___ times a day  Blood Thinner Instructions:ASA 81/ Dr. Salvadore Farber Aspirin Instructions:stop 5 days prior to DOS/ Dr. Shelle Iron Last Dose:04/13/21  Anesthesia review: yes  Patient denies shortness of breath, fever, cough and chest pain at PAT appointment Pt's BMI is 42.6 he uses a cane and doesn't move very much. He reports no SOB doing house work or with ADLs  Patient verbalized understanding of instructions that were given to them at the PAT appointment. Patient was also instructed that they will need to review over the PAT instructions again at home before surgery. yes

## 2021-04-13 ENCOUNTER — Ambulatory Visit: Payer: Self-pay | Admitting: Orthopedic Surgery

## 2021-04-13 LAB — HEMOGLOBIN A1C
Hgb A1c MFr Bld: 6.5 % — ABNORMAL HIGH (ref 4.8–5.6)
Mean Plasma Glucose: 140 mg/dL

## 2021-04-13 NOTE — H&P (Signed)
[name] is an 60 y.o. male.   Chief Complaint: right knee pain HPI: Visit For: Follow up (to discuss MRI) Location: right; knee Duration: 13 months; 01/25/20 Severity: pain level 4/10 Alleviating Factors: OTC medication (Tylenol); The patient was prescribed Methocarbamol and Meloxicam by his PCP. Associated Symptoms: no catching/locking; popping/clicking; instability  Past Medical History:  Diagnosis Date   Anginal pain (HCC) 12/29/2012   CKD (chronic kidney disease) stage 3, GFR 30-59 ml/min (HCC)    Diabetes mellitus type 2, noninsulin dependent (HCC)    GERD (gastroesophageal reflux disease)    H/O hiatal hernia    Headache(784.0)    "not daily, not weekly, but very often" (12/30/2012)   HTN (hypertension)    Hyperlipidemia    Morbid obesity (HCC)    Osteoarthritis    "hands" (12/30/2012)    Past Surgical History:  Procedure Laterality Date   CARPAL TUNNEL RELEASE Left 01/08/2018   Procedure: LEFT CARPAL TUNNEL RELEASE;  Surgeon: Cindee Salt, MD;  Location: [facility];  Service: Orthopedics;  Laterality: Left;  axillary block   ELBOW ARTHROPLASTY Left 1990's   "3 spurs; ?14 staples" (12/30/2012)   LEFT HEART CATHETERIZATION WITH CORONARY ANGIOGRAM N/A 12/31/2012   Procedure: LEFT HEART CATHETERIZATION WITH CORONARY ANGIOGRAM;  Surgeon: Tonny Bollman, MD;  Location: [facility] CATH LAB;  Service: Cardiovascular;  Laterality: N/A;   LUMBAR DISC SURGERY  2010; 2011   "2 ruptured discs; 2 pinched nerves"   SHOULDER SURGERY Right    ULNAR NERVE TRANSPOSITION Left 01/08/2018   Procedure: DECOMPRESSION LEFT ULNAR AT ELBOW;  Surgeon: Cindee Salt, MD;  Location:  SURGERY CENTER;  Service: Orthopedics;  Laterality: Left;  axillary block    No family history on file. Social History:  reports that he quit smoking about 38 years ago. His smoking use included cigarettes. He has a 12.00 pack-year smoking history. He quit smokeless tobacco use about 38 years ago.   His smokeless tobacco use included snuff. He reports that he does not drink alcohol and does not use drugs.  Allergies:  Allergies  Allergen Reactions   Quinolones Rash    Other reaction(s): Other (See Comments) "my mouth broke out" "my mouth broke out"    Tequin [Gatifloxacin] Other (See Comments)    "my mouth broke out"   Metformin And Related Nausea Only   Current medications: Baby Aspirin 81 mg chewable tablet ergocalciferol (vitamin D2) 1,250 mcg (50,000 unit) capsule famotidine 20 mg tablet gabapentin 100 mg capsule Jardiance 10 mg tablet meloxicam 15 mg tablet methocarbamoL 500 mg tablet olmesartan 40 mg-hydrochlorothiazide 25 mg tablet omeprazole 40 mg capsule,delayed release One-A-Day Men's Multivitamin rosuvastatin 20 mg tablet Senokot Toujeo Max U-300 SoloStar 300 unit/mL (3 mL) subcutaneous insulin pen Trulicity 4.5 mg/0.5 mL subcutaneous pen injector Tylenol Extra Strength Rapid Release 500 mg tablet Vitamin C   Results for orders placed or performed during the hospital encounter of 04/12/21 (from the past 48 hour(s))  Glucose, capillary     Status: Abnormal   Collection Time: 04/12/21  9:07 AM  Result Value Ref Range   Glucose-Capillary 147 (H) 70 - 99 mg/dL    Comment: Glucose reference range applies only to samples taken after fasting for at least 8 hours.  Hemoglobin A1c per protocol     Status: Abnormal   Collection Time: 04/12/21  9:45 AM  Result Value Ref Range   Hgb A1c MFr Bld 6.5 (H) 4.8 - 5.6 %    Comment: (NOTE)  Prediabetes: 5.7 - 6.4         Diabetes: >6.4         Glycemic control for adults with diabetes: <7.0    Mean Plasma Glucose 140 mg/dL    Comment: (NOTE) Performed At: [facility]  [address] [place], [place] [id number] Jolene Schimke MD YT:[id number]   CBC     Status: None   Collection Time: 04/12/21  9:45 AM  Result Value Ref Range   WBC 10.2 4.0 - 10.5 K/uL   RBC 4.89 4.22 - 5.81 MIL/uL   Hemoglobin  14.6 13.0 - 17.0 g/dL   HCT 77.1 16.5 - 79.0 %   MCV 90.0 80.0 - 100.0 fL   MCH 29.9 26.0 - 34.0 pg   MCHC 33.2 30.0 - 36.0 g/dL   RDW 38.3 33.8 - 32.9 %   Platelets 240 150 - 400 K/uL   nRBC 0.0 0.0 - 0.2 %    Comment: Performed at [facility], [address], [place], [place] [address]  Basic metabolic panel     Status: Abnormal   Collection Time: 04/12/21  9:45 AM  Result Value Ref Range   Sodium 137 135 - 145 mmol/L   Potassium 4.3 3.5 - 5.1 mmol/L   Chloride 106 98 - 111 mmol/L   CO2 21 (L) 22 - 32 mmol/L   Glucose, Bld 134 (H) 70 - 99 mg/dL    Comment: Glucose reference range applies only to samples taken after fasting for at least 8 hours.   BUN 34 (H) 8 - 23 mg/dL   Creatinine, Ser 0.60 (H) 0.61 - 1.24 mg/dL   Calcium 9.7 8.9 - 04.5 mg/dL   GFR, Estimated 47 (L) >60 mL/min    Comment: (NOTE) Calculated using the CKD-EPI Creatinine Equation (2021)    Anion gap 10 5 - 15    Comment: Performed at [facility], [address], [place], [place] [address]   No results found.  Review of Systems  Constitutional: Negative.   HENT: Negative.    Eyes: Negative.   Respiratory: Negative.    Cardiovascular: Negative.   Gastrointestinal: Negative.   Endocrine: Negative.   Genitourinary: Negative.   Musculoskeletal:  Positive for arthralgias, joint swelling and myalgias.  Skin: Negative.   Neurological: Negative.   Psychiatric/Behavioral: Negative.     There were no vitals taken for this visit. Physical Exam Constitutional:      Appearance: Normal appearance.  HENT:     Head: Normocephalic.     Right Ear: External ear normal.     Left Ear: External ear normal.     Nose: Nose normal.     Mouth/Throat:     Pharynx: Oropharynx is clear.  Eyes:     Conjunctiva/sclera: Conjunctivae normal.  Cardiovascular:     Rate and Rhythm: Normal rate.     Pulses: Normal pulses.  Pulmonary:     Effort: Pulmonary effort is normal.     Breath  sounds: Normal breath sounds.  Abdominal:     General: Bowel sounds are normal.  Musculoskeletal:     Cervical back: Normal range of motion.     Comments: Right knee is tender medial joint line equivocal McMurray. Mild effusion to moderate effusion. Ranges 0-1 30. No DVT.  Skin:    General: Skin is warm and dry.  Neurological:     Mental Status: He is alert.    MRI of his right knee demonstrates a chronic root equivalent tear of  the posterior horn of the medial meniscus with progressive meniscal degeneration. Chronic mildly depressed posterior medial tibial subchondral fracture with persistent marrow abnormalities suggestive of osteonecrosis. Chronic tear of the lateral meniscus. Mild patellofemoral chondromalacia. Partial tear of the ACL   Assessment/Plan Impression:  Patient is status post a posterior medial tibial plateau fracture from last year. Treated initially with activity modification. Limited weightbearing. Cortisone injection viscosupplementation without avail.  He has locking and giving way of the knee as well.  MRI indicating posterior horn medial meniscus root equivalent tear. Tear of the lateral meniscus. Partial tear of the ACL. 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  His BMI is near 45 which precludes any consideration of a knee replacement I discussed this. He very well had it may have symptoms either related to a combination of his meniscus tear to his area of osteonecrosis versus either of the 1 separately. He has been dealing with this for over a year. It is significantly disabled by his symptoms.  I discussed the risk  and benefits of knee arthroscopy including no changes in their symptoms worsening in their symptoms DVT, PE, anesthetic complications etc. Surgical possibilities include chondroplasty, microfracture, partial meniscectomy, plica excision etc. We also discussed the possible need for repeat arthroscopy in the future as well as possible continued treatment including corticosteroid injections and possible Visco supplementation. In addition we discussed the possibility of even eventually required a total knee replacement if significant arthritis encountered. Also indicating that it is an outpatient procedure. 1-2 days on crutches. Postoperative DVT prophylaxis with aspirin if tolerated. Follow-up in the office in 2 weeks following the surgery. Possible consideration of formal of supervised physical therapy as well.  Preoperative clearance.  We discussed the need for weight reduction. He is an independent diabetes. Discussed control of his blood glucose levels.  Plan: Right knee scope, partial medial and lateral meniscectomy and deb, microfracture vs abrasion tibia plateau, subchondroplasty  Dorothy Spark, PA-C for Dr Shelle Iron 04/13/2021, 12:49 PM

## 2021-04-13 NOTE — H&P (View-Only) (Signed)
[name] is an 60 y.o. male.   Chief Complaint: right knee pain HPI: Visit For: Follow up (to discuss MRI) Location: right; knee Duration: 13 months; 01/25/20 Severity: pain level 4/10 Alleviating Factors: OTC medication (Tylenol); The patient was prescribed Methocarbamol and Meloxicam by his PCP. Associated Symptoms: no catching/locking; popping/clicking; instability  Past Medical History:  Diagnosis Date   Anginal pain (HCC) 12/29/2012   CKD (chronic kidney disease) stage 3, GFR 30-59 ml/min (HCC)    Diabetes mellitus type 2, noninsulin dependent (HCC)    GERD (gastroesophageal reflux disease)    H/O hiatal hernia    Headache(784.0)    "not daily, not weekly, but very often" (12/30/2012)   HTN (hypertension)    Hyperlipidemia    Morbid obesity (HCC)    Osteoarthritis    "hands" (12/30/2012)    Past Surgical History:  Procedure Laterality Date   CARPAL TUNNEL RELEASE Left 01/08/2018   Procedure: LEFT CARPAL TUNNEL RELEASE;  Surgeon: Cindee Salt, MD;  Location: [facility];  Service: Orthopedics;  Laterality: Left;  axillary block   ELBOW ARTHROPLASTY Left 1990's   "3 spurs; ?14 staples" (12/30/2012)   LEFT HEART CATHETERIZATION WITH CORONARY ANGIOGRAM N/A 12/31/2012   Procedure: LEFT HEART CATHETERIZATION WITH CORONARY ANGIOGRAM;  Surgeon: Tonny Bollman, MD;  Location: [facility] CATH LAB;  Service: Cardiovascular;  Laterality: N/A;   LUMBAR DISC SURGERY  2010; 2011   "2 ruptured discs; 2 pinched nerves"   SHOULDER SURGERY Right    ULNAR NERVE TRANSPOSITION Left 01/08/2018   Procedure: DECOMPRESSION LEFT ULNAR AT ELBOW;  Surgeon: Cindee Salt, MD;  Location:  SURGERY CENTER;  Service: Orthopedics;  Laterality: Left;  axillary block    No family history on file. Social History:  reports that he quit smoking about 38 years ago. His smoking use included cigarettes. He has a 12.00 pack-year smoking history. He quit smokeless tobacco use about 38 years ago.   His smokeless tobacco use included snuff. He reports that he does not drink alcohol and does not use drugs.  Allergies:  Allergies  Allergen Reactions   Quinolones Rash    Other reaction(s): Other (See Comments) "my mouth broke out" "my mouth broke out"    Tequin [Gatifloxacin] Other (See Comments)    "my mouth broke out"   Metformin And Related Nausea Only   Current medications: Baby Aspirin 81 mg chewable tablet ergocalciferol (vitamin D2) 1,250 mcg (50,000 unit) capsule famotidine 20 mg tablet gabapentin 100 mg capsule Jardiance 10 mg tablet meloxicam 15 mg tablet methocarbamoL 500 mg tablet olmesartan 40 mg-hydrochlorothiazide 25 mg tablet omeprazole 40 mg capsule,delayed release One-A-Day Men's Multivitamin rosuvastatin 20 mg tablet Senokot Toujeo Max U-300 SoloStar 300 unit/mL (3 mL) subcutaneous insulin pen Trulicity 4.5 mg/0.5 mL subcutaneous pen injector Tylenol Extra Strength Rapid Release 500 mg tablet Vitamin C   Results for orders placed or performed during the hospital encounter of 04/12/21 (from the past 48 hour(s))  Glucose, capillary     Status: Abnormal   Collection Time: 04/12/21  9:07 AM  Result Value Ref Range   Glucose-Capillary 147 (H) 70 - 99 mg/dL    Comment: Glucose reference range applies only to samples taken after fasting for at least 8 hours.  Hemoglobin A1c per protocol     Status: Abnormal   Collection Time: 04/12/21  9:45 AM  Result Value Ref Range   Hgb A1c MFr Bld 6.5 (H) 4.8 - 5.6 %    Comment: (NOTE)  Prediabetes: 5.7 - 6.4         Diabetes: >6.4         Glycemic control for adults with diabetes: <7.0    Mean Plasma Glucose 140 mg/dL    Comment: (NOTE) Performed At: [facility]  [address] [place], [place] [id number] Jolene Schimke MD YT:[id number]   CBC     Status: None   Collection Time: 04/12/21  9:45 AM  Result Value Ref Range   WBC 10.2 4.0 - 10.5 K/uL   RBC 4.89 4.22 - 5.81 MIL/uL   Hemoglobin  14.6 13.0 - 17.0 g/dL   HCT 77.1 16.5 - 79.0 %   MCV 90.0 80.0 - 100.0 fL   MCH 29.9 26.0 - 34.0 pg   MCHC 33.2 30.0 - 36.0 g/dL   RDW 38.3 33.8 - 32.9 %   Platelets 240 150 - 400 K/uL   nRBC 0.0 0.0 - 0.2 %    Comment: Performed at [facility], [address], [place], [place] [address]  Basic metabolic panel     Status: Abnormal   Collection Time: 04/12/21  9:45 AM  Result Value Ref Range   Sodium 137 135 - 145 mmol/L   Potassium 4.3 3.5 - 5.1 mmol/L   Chloride 106 98 - 111 mmol/L   CO2 21 (L) 22 - 32 mmol/L   Glucose, Bld 134 (H) 70 - 99 mg/dL    Comment: Glucose reference range applies only to samples taken after fasting for at least 8 hours.   BUN 34 (H) 8 - 23 mg/dL   Creatinine, Ser 0.60 (H) 0.61 - 1.24 mg/dL   Calcium 9.7 8.9 - 04.5 mg/dL   GFR, Estimated 47 (L) >60 mL/min    Comment: (NOTE) Calculated using the CKD-EPI Creatinine Equation (2021)    Anion gap 10 5 - 15    Comment: Performed at [facility], [address], [place], [place] [address]   No results found.  Review of Systems  Constitutional: Negative.   HENT: Negative.    Eyes: Negative.   Respiratory: Negative.    Cardiovascular: Negative.   Gastrointestinal: Negative.   Endocrine: Negative.   Genitourinary: Negative.   Musculoskeletal:  Positive for arthralgias, joint swelling and myalgias.  Skin: Negative.   Neurological: Negative.   Psychiatric/Behavioral: Negative.     There were no vitals taken for this visit. Physical Exam Constitutional:      Appearance: Normal appearance.  HENT:     Head: Normocephalic.     Right Ear: External ear normal.     Left Ear: External ear normal.     Nose: Nose normal.     Mouth/Throat:     Pharynx: Oropharynx is clear.  Eyes:     Conjunctiva/sclera: Conjunctivae normal.  Cardiovascular:     Rate and Rhythm: Normal rate.     Pulses: Normal pulses.  Pulmonary:     Effort: Pulmonary effort is normal.     Breath  sounds: Normal breath sounds.  Abdominal:     General: Bowel sounds are normal.  Musculoskeletal:     Cervical back: Normal range of motion.     Comments: Right knee is tender medial joint line equivocal McMurray. Mild effusion to moderate effusion. Ranges 0-1 30. No DVT.  Skin:    General: Skin is warm and dry.  Neurological:     Mental Status: He is alert.    MRI of his right knee demonstrates a chronic root equivalent tear of  the posterior horn of the medial meniscus with progressive meniscal degeneration. Chronic mildly depressed posterior medial tibial subchondral fracture with persistent marrow abnormalities suggestive of osteonecrosis. Chronic tear of the lateral meniscus. Mild patellofemoral chondromalacia. Partial tear of the ACL   Assessment/Plan Impression:  Patient is status post a posterior medial tibial plateau fracture from last year. Treated initially with activity modification. Limited weightbearing. Cortisone injection viscosupplementation without avail.  He has locking and giving way of the knee as well.  MRI indicating posterior horn medial meniscus root equivalent tear. Tear of the lateral meniscus. Partial tear of the ACL. 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  His BMI is near 45 which precludes any consideration of a knee replacement I discussed this. He very well had it may have symptoms either related to a combination of his meniscus tear to his area of osteonecrosis versus either of the 1 separately. He has been dealing with this for over a year. It is significantly disabled by his symptoms.  I discussed the risk  and benefits of knee arthroscopy including no changes in their symptoms worsening in their symptoms DVT, PE, anesthetic complications etc. Surgical possibilities include chondroplasty, microfracture, partial meniscectomy, plica excision etc. We also discussed the possible need for repeat arthroscopy in the future as well as possible continued treatment including corticosteroid injections and possible Visco supplementation. In addition we discussed the possibility of even eventually required a total knee replacement if significant arthritis encountered. Also indicating that it is an outpatient procedure. 1-2 days on crutches. Postoperative DVT prophylaxis with aspirin if tolerated. Follow-up in the office in 2 weeks following the surgery. Possible consideration of formal of supervised physical therapy as well.  Preoperative clearance.  We discussed the need for weight reduction. He is an independent diabetes. Discussed control of his blood glucose levels.  Plan: Right knee scope, partial medial and lateral meniscectomy and deb, microfracture vs abrasion tibia plateau, subchondroplasty  Dorothy Spark, PA-C for Dr Shelle Iron 04/13/2021, 12:49 PM

## 2021-04-17 NOTE — Anesthesia Preprocedure Evaluation (Addendum)
Anesthesia Evaluation  Patient identified by MRN, date of birth, ID band Patient awake    Reviewed: Allergy & Precautions, NPO status , Patient's Chart, lab work & pertinent test results  Airway Mallampati: II  TM Distance: >3 FB Neck ROM: Full    Dental no notable dental hx. (+) Teeth Intact, Dental Advisory Given,    Pulmonary former smoker,    Pulmonary exam normal breath sounds clear to auscultation       Cardiovascular hypertension, Pt. on medications Normal cardiovascular exam Rhythm:Regular Rate:Normal     Neuro/Psych negative neurological ROS  negative psych ROS   GI/Hepatic Neg liver ROS, hiatal hernia, GERD  Medicated,  Endo/Other  diabetesMorbid obesity  Renal/GU Renal diseaseLab Results      Component                Value               Date                      CREATININE               1.65 (H)            04/12/2021                BUN                      34 (H)              04/12/2021                NA                       137                 04/12/2021                K                        4.3                 04/12/2021                CL                       106                 04/12/2021                CO2                      21 (L)              04/12/2021                Musculoskeletal  (+) Arthritis ,   Abdominal (+) + obese,   Peds  Hematology Lab Results      Component                Value               Date                      WBC                      10.2  04/12/2021                HGB                      14.6                04/12/2021                HCT                      44.0                04/12/2021                MCV                      90.0                04/12/2021                PLT                      240                 04/12/2021              Anesthesia Other Findings All: Quinolones, Tequin, Metformin  Reproductive/Obstetrics                             Anesthesia Physical Anesthesia Plan  ASA: 3  Anesthesia Plan: General   Post-op Pain Management:  Regional for Post-op pain   Induction: Intravenous  PONV Risk Score and Plan: 2 and Treatment may vary due to age or medical condition, Ondansetron and Midazolam  Airway Management Planned: LMA  Additional Equipment: None  Intra-op Plan:   Post-operative Plan:   Informed Consent:     Dental advisory given  Plan Discussed with: CRNA and Anesthesiologist  Anesthesia Plan Comments: (LMAl w R adductor)       Anesthesia Quick Evaluation

## 2021-04-18 ENCOUNTER — Encounter (HOSPITAL_COMMUNITY): Payer: Self-pay | Admitting: Specialist

## 2021-04-18 ENCOUNTER — Ambulatory Visit (HOSPITAL_COMMUNITY): Payer: Medicare Other | Admitting: Anesthesiology

## 2021-04-18 ENCOUNTER — Ambulatory Visit (HOSPITAL_COMMUNITY)
Admission: RE | Admit: 2021-04-18 | Discharge: 2021-04-18 | Disposition: A | Discharge status: Home / Self Care | Payer: Medicare Other | Attending: Specialist | Admitting: Specialist

## 2021-04-18 ENCOUNTER — Encounter (HOSPITAL_COMMUNITY)
Admission: RE | Disposition: A | Discharge status: Home / Self Care | Payer: Self-pay | Source: Home / Self Care | Attending: Specialist

## 2021-04-18 DIAGNOSIS — Z791 Long term (current) use of non-steroidal anti-inflammatories (NSAID): Secondary | ICD-10-CM | POA: Diagnosis not present

## 2021-04-18 DIAGNOSIS — Z6841 Body Mass Index (BMI) 40.0 and over, adult: Secondary | ICD-10-CM | POA: Diagnosis not present

## 2021-04-18 DIAGNOSIS — Z881 Allergy status to other antibiotic agents status: Secondary | ICD-10-CM | POA: Insufficient documentation

## 2021-04-18 DIAGNOSIS — M87261 Osteonecrosis due to previous trauma, right tibia: Secondary | ICD-10-CM | POA: Diagnosis not present

## 2021-04-18 DIAGNOSIS — M23200 Derangement of unspecified lateral meniscus due to old tear or injury, right knee: Secondary | ICD-10-CM | POA: Insufficient documentation

## 2021-04-18 DIAGNOSIS — Z87891 Personal history of nicotine dependence: Secondary | ICD-10-CM | POA: Diagnosis not present

## 2021-04-18 DIAGNOSIS — N183 Chronic kidney disease, stage 3 unspecified: Secondary | ICD-10-CM | POA: Insufficient documentation

## 2021-04-18 DIAGNOSIS — I129 Hypertensive chronic kidney disease with stage 1 through stage 4 chronic kidney disease, or unspecified chronic kidney disease: Secondary | ICD-10-CM | POA: Insufficient documentation

## 2021-04-18 DIAGNOSIS — E1122 Type 2 diabetes mellitus with diabetic chronic kidney disease: Secondary | ICD-10-CM | POA: Insufficient documentation

## 2021-04-18 DIAGNOSIS — M23231 Derangement of other medial meniscus due to old tear or injury, right knee: Secondary | ICD-10-CM | POA: Insufficient documentation

## 2021-04-18 DIAGNOSIS — S83241A Other tear of medial meniscus, current injury, right knee, initial encounter: Secondary | ICD-10-CM | POA: Diagnosis present

## 2021-04-18 HISTORY — PX: KNEE ARTHROSCOPY WITH SUBCHONDROPLASTY: SHX6732

## 2021-04-18 LAB — GLUCOSE, CAPILLARY
Glucose-Capillary: 115 mg/dL — ABNORMAL HIGH (ref 70–99)
Glucose-Capillary: 94 mg/dL (ref 70–99)

## 2021-04-18 SURGERY — ARTHROSCOPY, KNEE, WITH SUBCHONDROPLASTY
Anesthesia: General | Site: Knee | Laterality: Right

## 2021-04-18 MED ORDER — HYDROMORPHONE HCL 1 MG/ML IJ SOLN: 0.2500 mg | INTRAMUSCULAR | Status: DC | PRN

## 2021-04-18 MED ORDER — BUPIVACAINE-EPINEPHRINE 0.5% -1:200000 IJ SOLN
INTRAMUSCULAR | Status: DC | PRN
  Administered 2021-04-18: 20 mL

## 2021-04-18 MED ORDER — LACTATED RINGERS IR SOLN
Status: DC | PRN
  Administered 2021-04-18: 6000 mL

## 2021-04-18 MED ORDER — EPHEDRINE SULFATE 50 MG/ML IJ SOLN
INTRAMUSCULAR | Status: DC | PRN
  Administered 2021-04-18 (×2): 10 mg via INTRAVENOUS
  Administered 2021-04-18: 5 mg via INTRAVENOUS

## 2021-04-18 MED ORDER — ONDANSETRON HCL 4 MG/2ML IJ SOLN
INTRAMUSCULAR | Status: DC | PRN
  Administered 2021-04-18: 4 mg via INTRAVENOUS

## 2021-04-18 MED ORDER — ACETAMINOPHEN 10 MG/ML IV SOLN: 1000.0000 mg | Freq: Once | INTRAVENOUS | Status: DC | PRN

## 2021-04-18 MED ORDER — ESMOLOL HCL 100 MG/10ML IV SOLN
INTRAVENOUS | Status: AC
  Filled 2021-04-18: qty 10

## 2021-04-18 MED ORDER — PROPOFOL 10 MG/ML IV BOLUS
INTRAVENOUS | Status: AC
  Filled 2021-04-18: qty 20

## 2021-04-18 MED ORDER — LACTATED RINGERS IV SOLN: INTRAVENOUS | Status: DC

## 2021-04-18 MED ORDER — DEXAMETHASONE SODIUM PHOSPHATE 10 MG/ML IJ SOLN
INTRAMUSCULAR | Status: DC | PRN
  Administered 2021-04-18: 10 mg via INTRAVENOUS

## 2021-04-18 MED ORDER — EPINEPHRINE PF 1 MG/ML IJ SOLN
INTRAMUSCULAR | Status: DC | PRN
  Administered 2021-04-18: 1 mg

## 2021-04-18 MED ORDER — DOCUSATE SODIUM 100 MG PO CAPS: 100.0000 mg | ORAL_CAPSULE | Freq: Two times a day (BID) | ORAL | 0 refills | Status: AC

## 2021-04-18 MED ORDER — FENTANYL CITRATE (PF) 100 MCG/2ML IJ SOLN
INTRAMUSCULAR | Status: AC
  Administered 2021-04-18: 100 ug
  Filled 2021-04-18: qty 2

## 2021-04-18 MED ORDER — CHLORHEXIDINE GLUCONATE 0.12 % MT SOLN
15.0000 mL | Freq: Once | OROMUCOSAL | Status: AC
  Administered 2021-04-18: 15 mL via OROMUCOSAL

## 2021-04-18 MED ORDER — EPINEPHRINE PF 1 MG/ML IJ SOLN
INTRAMUSCULAR | Status: AC
  Filled 2021-04-18: qty 1

## 2021-04-18 MED ORDER — PROPOFOL 500 MG/50ML IV EMUL
INTRAVENOUS | Status: DC | PRN
  Administered 2021-04-18: 190 mg via INTRAVENOUS

## 2021-04-18 MED ORDER — ORAL CARE MOUTH RINSE: 15.0000 mL | Freq: Once | OROMUCOSAL | Status: AC

## 2021-04-18 MED ORDER — FENTANYL CITRATE (PF) 100 MCG/2ML IJ SOLN
INTRAMUSCULAR | Status: AC
  Filled 2021-04-18: qty 2

## 2021-04-18 MED ORDER — MIDAZOLAM HCL 2 MG/2ML IJ SOLN
INTRAMUSCULAR | Status: AC
  Filled 2021-04-18: qty 2

## 2021-04-18 MED ORDER — BUPIVACAINE-EPINEPHRINE 0.5% -1:200000 IJ SOLN
INTRAMUSCULAR | Status: AC
  Filled 2021-04-18: qty 1

## 2021-04-18 MED ORDER — CEFAZOLIN IN SODIUM CHLORIDE 3-0.9 GM/100ML-% IV SOLN
3.0000 g | INTRAVENOUS | Status: AC
  Administered 2021-04-18: 3 g via INTRAVENOUS
  Filled 2021-04-18: qty 100

## 2021-04-18 MED ORDER — ASPIRIN EC 81 MG PO TBEC: 81.0000 mg | DELAYED_RELEASE_TABLET | Freq: Every day | ORAL | 1 refills | Status: AC

## 2021-04-18 MED ORDER — LIDOCAINE HCL (CARDIAC) PF 100 MG/5ML IV SOSY
PREFILLED_SYRINGE | INTRAVENOUS | Status: DC | PRN
  Administered 2021-04-18: 100 mg via INTRATRACHEAL

## 2021-04-18 MED ORDER — MIDAZOLAM HCL 2 MG/2ML IJ SOLN
INTRAMUSCULAR | Status: AC
  Administered 2021-04-18: 2 mg
  Filled 2021-04-18: qty 2

## 2021-04-18 MED ORDER — PROPOFOL 1000 MG/100ML IV EMUL
INTRAVENOUS | Status: AC
  Filled 2021-04-18: qty 100

## 2021-04-18 MED ORDER — LIDOCAINE 2% (20 MG/ML) 5 ML SYRINGE
INTRAMUSCULAR | Status: AC
  Filled 2021-04-18: qty 5

## 2021-04-18 MED ORDER — OXYCODONE HCL 5 MG/5ML PO SOLN: 5.0000 mg | Freq: Once | ORAL | Status: DC | PRN

## 2021-04-18 MED ORDER — OXYCODONE HCL 5 MG PO TABS: 5.0000 mg | ORAL_TABLET | ORAL | 0 refills | Status: DC | PRN

## 2021-04-18 MED ORDER — ONDANSETRON HCL 4 MG/2ML IJ SOLN
4.0000 mg | Freq: Once | INTRAMUSCULAR | Status: DC | PRN
Start: 2021-04-18 — End: 2021-04-18

## 2021-04-18 MED ORDER — FENTANYL CITRATE (PF) 100 MCG/2ML IJ SOLN
INTRAMUSCULAR | Status: DC | PRN
  Administered 2021-04-18 (×2): 50 ug via INTRAVENOUS

## 2021-04-18 MED ORDER — DEXMEDETOMIDINE (PRECEDEX) IN NS 20 MCG/5ML (4 MCG/ML) IV SYRINGE
PREFILLED_SYRINGE | INTRAVENOUS | Status: DC | PRN
  Administered 2021-04-18: 12 ug via INTRAVENOUS

## 2021-04-18 MED ORDER — AMISULPRIDE (ANTIEMETIC) 5 MG/2ML IV SOLN: 10.0000 mg | Freq: Once | INTRAVENOUS | Status: DC | PRN

## 2021-04-18 MED ORDER — OXYCODONE HCL 5 MG PO TABS
5.0000 mg | ORAL_TABLET | Freq: Once | ORAL | Status: DC | PRN
Start: 2021-04-18 — End: 2021-04-18

## 2021-04-18 SURGICAL SUPPLY — 35 items
BAG COUNTER SPONGE SURGICOUNT (BAG) IMPLANT
BLADE SHAVER TORPEDO 4X13 (MISCELLANEOUS) ×2 IMPLANT
BLADE SURG SZ11 CARB STEEL (BLADE) ×2 IMPLANT
BNDG ELASTIC 6X5.8 VLCR STR LF (GAUZE/BANDAGES/DRESSINGS) IMPLANT
BOOTIES KNEE HIGH SLOAN (MISCELLANEOUS) ×4 IMPLANT
CANNULA ACUFLEX KIT 5X76 (CANNULA) IMPLANT
COVER SURGICAL LIGHT HANDLE (MISCELLANEOUS) ×2 IMPLANT
DISSECTOR 3.5MM X 13CM (MISCELLANEOUS) IMPLANT
DISSECTOR 3.5MM X 13CM CVD (MISCELLANEOUS) IMPLANT
DISSECTOR 4.0MMX13CM CVD (MISCELLANEOUS) IMPLANT
DRSG EMULSION OIL 3X3 NADH (GAUZE/BANDAGES/DRESSINGS) IMPLANT
DRSG PAD ABDOMINAL 8X10 ST (GAUZE/BANDAGES/DRESSINGS) IMPLANT
DURAPREP 26ML APPLICATOR (WOUND CARE) ×2 IMPLANT
ELECT MENISCUS 165MM 90D (ELECTRODE) IMPLANT
GLOVE SRG 8 PF TXTR STRL LF DI (GLOVE) ×6 IMPLANT
GLOVE SURG POLYISO LF SZ7.5 (GLOVE) ×4 IMPLANT
GLOVE SURG POLYISO LF SZ8 (GLOVE) ×4 IMPLANT
GLOVE SURG UNDER POLY LF SZ7.5 (GLOVE) ×2 IMPLANT
GLOVE SURG UNDER POLY LF SZ8 (GLOVE) ×12
GOWN STRL REUS W/TWL LRG LVL3 (GOWN DISPOSABLE) ×2 IMPLANT
GOWN STRL REUS W/TWL XL LVL3 (GOWN DISPOSABLE) ×4 IMPLANT
KIT BASIN OR (CUSTOM PROCEDURE TRAY) ×2 IMPLANT
KIT TURNOVER KIT A (KITS) ×2 IMPLANT
MANIFOLD NEPTUNE II (INSTRUMENTS) ×2 IMPLANT
PACK ARTHROSCOPY WL (CUSTOM PROCEDURE TRAY) ×2 IMPLANT
PADDING CAST COTTON 6X4 STRL (CAST SUPPLIES) IMPLANT
PENCIL SMOKE EVACUATOR (MISCELLANEOUS) IMPLANT
PORT APPOLLO RF 90DEGREE MULTI (SURGICAL WAND) IMPLANT
PROBE BIPOLAR ATHRO 135MM 90D (MISCELLANEOUS) IMPLANT
SUT ETHILON 4 0 PS 2 18 (SUTURE) ×2 IMPLANT
TOWEL OR 17X26 10 PK STRL BLUE (TOWEL DISPOSABLE) ×2 IMPLANT
TUBING ARTHROSCOPY IRRIG 16FT (MISCELLANEOUS) ×2 IMPLANT
WAND APOLLORF SJ50 AR-9845 (SURGICAL WAND) ×6 IMPLANT
WIPE CHG CHLORHEXIDINE 2% (PERSONAL CARE ITEMS) IMPLANT
WRAP KNEE MAXI GEL POST OP (GAUZE/BANDAGES/DRESSINGS) ×2 IMPLANT

## 2021-04-18 NOTE — Brief Op Note (Signed)
04/18/2021  1:27 PM  PATIENT:  [name]  60 y.o. male  PRE-OPERATIVE DIAGNOSIS:  Right knee medial and lateral meniscus tear,avascular necrosis of tibial plateau,  POST-OPERATIVE DIAGNOSIS:  right knee medal and lateral meniscus tears  PROCEDURE:  Procedure(s) with comments: KNEE ARTHROSCOPY WITH PARTIAL MEDIAL AND LATERAL MENISCECTOMY AND DEBRIDEMENT,MICROFRACTURE VERSES ABRASION AND SUBCHONDROPLASTY (Right) -  SURGEON:  Surgeon(s) and Role:    Jene Every, MD - Primary  PHYSICIAN ASSISTANT:   ASSISTANTS: Bissell   ANESTHESIA:   general    EBL:  50 mL   BLOOD ADMINISTERED:none  DRAINS: none   LOCAL MEDICATIONS USED:  MARCAINE     SPECIMEN:  No Specimen  DISPOSITION OF SPECIMEN:  N/A  COUNTS:  YES  TOURNIQUET:  * No tourniquets in log *  DICTATION: .Other Dictation: Dictation Number [id number]  PLAN OF CARE: Discharge to home after PACU  PATIENT DISPOSITION:  PACU - hemodynamically stable.   Delay start of Pharmacological VTE agent (>24hrs) due to surgical blood loss or risk of bleeding: no

## 2021-04-18 NOTE — Discharge Instructions (Signed)
ARTHROSCOPIC KNEE SURGERY HOME CARE INSTRUCTIONS   PAIN You will be expected to have a moderate amount of pain in the affected knee for approximately two weeks.  However, the first two to four days will be the most severe in terms of the pain you will experience.  Prescriptions have been provided for you to take as needed for the pain.  The pain can be markedly reduced by using the ice/compressive bandage given.  Exchange the ice packs whenever they thaw.  During the night, keep the bandage on because it will still provide some compression for the swelling.  Also, keep the leg elevated on pillows above your heart, and this will help alleviate the pain and swelling.  MEDICATION Prescriptions have been provided to take as needed for pain. To prevent blood clots, take Aspirin 325mg  daily with a meal if not on a blood thinner and if no history of stomach ulcers.  ACTIVITY It is preferred that you stay on bedrest for approximately 24 hours.  However, you may go to the bathroom with help.  After this, you can start to be up and about progressively more.  Remember that the swelling may still increase after three to four days if you are up and doing too much.    Use your walker for comfort and safety and maintain toe-touch weightbearing on the right leg only.  DRESSING Keep the current dressing as dry as possible.  Two days after your surgery, you may remove the ice/compressive wrap, and surgical dressing.  You may now take a shower, but do not scrub the sounds directly with soap.  Let water rinse over these and gently wipe with your hand.  Reapply band-aids over the puncture wounds and more gauze if needed.  A slight amount of thin drainage can be normal at this time, and do not let it frighten you.  Reapply the ice/compressive wrap.  You may now repeat this every day each time you shower.  SYMPTOMS TO REPORT TO YOUR DOCTOR  -Extreme pain.  -Extreme swelling.  -Temperature above 101 degrees that does not  come down with acetaminophen     (Tylenol).  -Any changes in the feeling, color or movement of your toes.  -Extreme redness, heat, swelling or drainage at your incision  EXERCISE It is preferred that you begin to exercise on the day of your surgery.  Straight leg raises and short arc quads should be begun the afternoon or evening of surgery and continued until you come back for your follow-up appointment.   Attached is an instruction sheet on how to perform these two simple exercises.  Do these at least three times per day if not more.  You may bend your knee as much as is comfortable.  The puncture wounds may occasionally be slightly uncomfortable with bending of the knee.  Do not let this frighten you.  It is important to keep your knee motion, but do not overdo it.  If you have significant pain, simply do not bend the knee as far.   You will be given more exercises to perform at your first return visit.    RETURN APPOINTMENT Please make an appointment to be seen by your doctor in 10-14 days from your surgery.  Patient Signature:  ________________________________________________________  Nurse's Signature:  ________________________________________________________

## 2021-04-18 NOTE — Interval H&P Note (Signed)
History and Physical Interval Note:  04/18/2021 11:29 AM  [name]  has presented today for surgery, with the diagnosis of Right knee medial and lateral meniscus tear,avascular necrosis of tibial plateau,.  The various methods of treatment have been discussed with the patient and family. After consideration of risks, benefits and other options for treatment, the patient has consented to  Procedure(s) with comments: KNEE ARTHROSCOPY WITH PARTIAL MEDIAL AND LATERAL MENISCECTOMY AND DEBRIDEMENT,MICROFRACTURE VERSES ABRASION AND SUBCHONDROPLASTY (Right) - as a surgical intervention.  The patient's history has been reviewed, patient examined, no change in status, stable for surgery.  I have reviewed the patient's chart and labs.  Questions were answered to the patient's satisfaction.   Possible subchondroplasty  [name]

## 2021-04-18 NOTE — Anesthesia Procedure Notes (Signed)
Procedure Name: LMA Insertion Date/Time: 04/18/2021 12:07 PM Performed by: Brock Ra, CRNA Pre-anesthesia Checklist: Patient identified, Emergency Drugs available, Suction available, Patient being monitored and Timeout performed Patient Re-evaluated:Patient Re-evaluated prior to induction Oxygen Delivery Method: Circle system utilized Preoxygenation: Pre-oxygenation with 100% oxygen Induction Type: IV induction Ventilation: Mask ventilation without difficulty LMA: LMA inserted LMA Size: 5.0 Number of attempts: 1 Placement Confirmation: positive ETCO2 and breath sounds checked- equal and bilateral Dental Injury: Teeth and Oropharynx as per pre-operative assessment

## 2021-04-18 NOTE — Anesthesia Postprocedure Evaluation (Signed)
Anesthesia Post Note  Patient: [name]  Procedure(s) Performed: KNEE ARTHROSCOPY WITH PARTIAL MEDIAL AND LATERAL MENISCECTOMY AND DEBRIDEMENT,MICROFRACTURE medial tibial plateau (Right: Knee)     Patient location during evaluation: PACU Anesthesia Type: General Level of consciousness: awake and alert and oriented Pain management: pain level controlled Vital Signs Assessment: post-procedure vital signs reviewed and stable Respiratory status: spontaneous breathing, nonlabored ventilation and respiratory function stable Cardiovascular status: blood pressure returned to baseline and stable Postop Assessment: no apparent nausea or vomiting Anesthetic complications: no   No notable events documented.  Last Vitals:  Vitals:   04/18/21 1415 04/18/21 1430  BP: 121/79 123/66  Pulse: 68 64  Resp: 16   Temp:  36.4 C  SpO2: 100% 94%    Last Pain:  Vitals:   04/18/21 1430  TempSrc:   PainSc: 0-No pain                 Orlo Brickle A.

## 2021-04-18 NOTE — Transfer of Care (Signed)
Immediate Anesthesia Transfer of Care Note  Patient: Dreyson Mishkin [facility]  Procedure(s) Performed: KNEE ARTHROSCOPY WITH PARTIAL MEDIAL AND LATERAL MENISCECTOMY AND DEBRIDEMENT,MICROFRACTURE medial tibial plateau (Right: Knee)  Patient Location: PACU  Anesthesia Type:General  Level of Consciousness: awake  Airway & Oxygen Therapy: Patient Spontanous Breathing and Patient connected to face mask oxygen  Post-op Assessment: Report given to RN and Post -op Vital signs reviewed and stable  Post vital signs: Reviewed and stable  Last Vitals:  Vitals Value Taken Time  BP 136/73   Temp    Pulse 65 04/18/21 1335  Resp 13 04/18/21 1335  SpO2 98 % 04/18/21 1335  Vitals shown include unvalidated device data.  Last Pain:  Vitals:   04/18/21 0932  TempSrc: Oral  PainSc: 4       Patients Stated Pain Goal: 3 (04/18/21 0932)  Complications: No notable events documented.

## 2021-04-18 NOTE — Progress Notes (Signed)
AssistedDr. Houser with right, ultrasound guided, adductor canal block. Side rails up, monitors on throughout procedure. See vital signs in flow sheet. Tolerated Procedure well.  

## 2021-04-19 ENCOUNTER — Encounter (HOSPITAL_COMMUNITY): Payer: Self-pay | Admitting: Specialist

## 2021-04-19 NOTE — Op Note (Signed)
NAME: [name], [name]. MEDICAL RECORD NO: [id number] ACCOUNT NO: [id number] DATE OF BIRTH: [birth year] FACILITY: [facility] LOCATION: [facility] PHYSICIAN: Javier Docker, MD  Operative Report   DATE OF PROCEDURE: 04/18/2021  PREOPERATIVE DIAGNOSIS:   1.  Medial and lateral meniscus tears of the right knee. 2.  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  POSTOPERATIVE DIAGNOSIS:   1.  Medial and lateral meniscus tears of the right knee. 2.  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  PROCEDURE PERFORMED:   1.  Right knee arthroscopy. 2.  Partial medial and lateral meniscectomies. 3.  Chondroplasty tibial plateau medially, medial femoral condyle, patella. 4.  Microfracture medial tibial plateau centrally.  ANESTHESIA:  General.  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  Lateral meniscus tear.  He was indicated for knee arthroscopy, partial meniscectomy, chondroplasty, possible abrasion arthroplasty versus microfracture versus subchondroplasty.  Risks and benefits  discussed including bleeding, infection, damage to neurovascular structures.  No change in symptoms, worsening symptoms, DVT, PE, anesthetic complications, etc.  DESCRIPTION OF PROCEDURE:  With the patient in supine position, after induction of adequate general anesthesia, 2 grams Kefzol, the right lower extremity was prepped and draped in the usual sterile fashion.  A lateral parapatellar portal fashioned with a  #11 blade, ingress cannula atraumatically placed.  Irrigant was utilized to insufflate the joint.  Clear synovial fluid was evacuated.  Under direct visualization, a  medial parapatellar portal was fashioned with a #11 blade after localization with an  18-gauge needle, sparing the medial meniscus.  Noted initially was a central portion of the tibial plateau was depressed with chondromalacia overlying that.  This measured approximately 3 x 3 cm.  There was a tear near the posterior horn of the medial  meniscus.  There was displaced segments to the joint.  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  A light chondroplasty was performed here as well as over the tibial plateau.  I introduced a basket and resected a portion of the  root tear that was displaced and then a portion of the lateral meniscus was separated from the root.  We trimmed that and contoured that with combination of upbiting baskets and the Arthro wand.  The remnant was stable to probe palpation portion on  extended back to near the meniscal capsular junction.  Following this, I decreased the pressure to 0 and there was no active bleeding noted.  Re-insufflated to 65 mmHg.  We then looked at the central tibial plateau depression.  It was fairly soft.   Minimal cartilage overlying that and essentially near grade 4 lesion.  I then used a 90-degree awl and then depressed into this area multiple times.  Approximately 6 spaced equally apart.  I had some bleeding bone as a result of this, but it was a softer  bone.  There was also a small section underneath the medial meniscus centrally, the center portion of the mid meniscus.  Approximately 1.5 x 1 cm, grade 4.  I used a 90-degree awl and placed two microfractures.  It was depressing and approximately 2 mm.   Bleeding tissue was noted there as well.  This the medial compartment.  Again, the meniscus  was stable to probe palpation.  No other lesions were noted.  ACL revealed some minor fraying of the ACL laterally.  There was some loose strands that were  debrided.  Anterior drawer revealed a solid endpoint.  There was no empty wall  sign.  Laterally with figure-of-four position utilizing the assistant and an elevated BMI of 43.  There was generalized chondromalacia of the compartment.  A radial tear in the lateral meniscus, I shaved the lateral meniscus to a stable base.  Small radial  tears were noted.  Light chondroplasty was performed of the femoral condyle.  The remnant of the meniscus was stable to probe palpation.  Suprapatellar pouch revealed some grade 3 changes of the patella like chondroplasty performed here, normal patellofemoral tracking.  Gutters unremarkable.  I revisited all compartments.  No further pathology.  Minimal arthroscopic intervention.  Minor  bleeding around the ACL debridement.  This was cauterized.  All instrumentation was then removed.  Portals were closed with 4-0 nylon simple sutures.  0.25% Marcaine with epinephrine was infiltrated in the joint.  Wound was dressed sterilely.  He awoke  without difficulty and transported to the recovery room in satisfactory condition.  The patient tolerated the procedure well.  No complications.    ASSISTANT:  Andrez Grime, PA  Minimal blood loss.  Patient's postoperative plan will be to toe touch weightbear.  Hoping to vascularize the avascular portion of the tibial plateau.     [email or web address] D: 04/19/2021 7:33:48 am T: 04/19/2021 8:44:00 am  JOB: [id number]/ [id number]

## 2021-06-07 ENCOUNTER — Ambulatory Visit: Payer: Medicare Other | Attending: Specialist | Admitting: Physical Therapy

## 2021-06-07 ENCOUNTER — Other Ambulatory Visit: Payer: Self-pay

## 2021-06-07 ENCOUNTER — Encounter: Payer: Self-pay | Admitting: Physical Therapy

## 2021-06-07 DIAGNOSIS — G8929 Other chronic pain: Secondary | ICD-10-CM | POA: Insufficient documentation

## 2021-06-07 DIAGNOSIS — R600 Localized edema: Secondary | ICD-10-CM | POA: Diagnosis present

## 2021-06-07 DIAGNOSIS — R262 Difficulty in walking, not elsewhere classified: Secondary | ICD-10-CM | POA: Insufficient documentation

## 2021-06-07 DIAGNOSIS — M25661 Stiffness of right knee, not elsewhere classified: Secondary | ICD-10-CM | POA: Insufficient documentation

## 2021-06-07 DIAGNOSIS — M25561 Pain in right knee: Secondary | ICD-10-CM | POA: Insufficient documentation

## 2021-06-07 DIAGNOSIS — M6281 Muscle weakness (generalized): Secondary | ICD-10-CM | POA: Insufficient documentation

## 2021-06-07 NOTE — Therapy (Signed)
[facility] [address] [place], [place], [address] Phone: [phone]   Fax:  [phone]  Physical Therapy Evaluation  Patient Details  Name: [name] MRN: [id number] Date of Birth: [birth year] Referring Provider (PT): Jene Every MD   Encounter Date: 06/07/2021   PT End of Session - 06/07/21 0944     Visit Number 1    Number of Visits 12    Date for PT Re-Evaluation 07/05/21    PT Start Time 0855    PT Stop Time 0928    PT Time Calculation (min) 33 min    Activity Tolerance Patient tolerated treatment well    Behavior During Therapy [facility] for tasks assessed/performed             Past Medical History:  Diagnosis Date   Anginal pain (HCC) 12/29/2012   CKD (chronic kidney disease) stage 3, GFR 30-59 ml/min (HCC)    Diabetes mellitus type 2, noninsulin dependent (HCC)    GERD (gastroesophageal reflux disease)    H/O hiatal hernia    Headache(784.0)    "not daily, not weekly, but very often" (12/30/2012)   HTN (hypertension)    Hyperlipidemia    Morbid obesity (HCC)    Osteoarthritis    "hands" (12/30/2012)    Past Surgical History:  Procedure Laterality Date   CARPAL TUNNEL RELEASE Left 01/08/2018   Procedure: LEFT CARPAL TUNNEL RELEASE;  Surgeon: Cindee Salt, MD;  Location: [facility];  Service: Orthopedics;  Laterality: Left;  axillary block   ELBOW ARTHROPLASTY Left 1990's   "3 spurs; ?14 staples" (12/30/2012)   KNEE ARTHROSCOPY WITH SUBCHONDROPLASTY Right 04/18/2021   Procedure: KNEE ARTHROSCOPY WITH PARTIAL MEDIAL AND LATERAL MENISCECTOMY AND DEBRIDEMENT,MICROFRACTURE medial tibial plateau;  Surgeon: Jene Every, MD;  Location: [facility];  Service: Orthopedics;  Laterality: Right;    LEFT HEART CATHETERIZATION WITH CORONARY ANGIOGRAM N/A 12/31/2012   Procedure: LEFT HEART CATHETERIZATION WITH CORONARY ANGIOGRAM;  Surgeon: Tonny Bollman, MD;  Location: [facility] CATH LAB;  Service: Cardiovascular;   Laterality: N/A;   LUMBAR DISC SURGERY  2010; 2011   "2 ruptured discs; 2 pinched nerves"   SHOULDER SURGERY Right    ULNAR NERVE TRANSPOSITION Left 01/08/2018   Procedure: DECOMPRESSION LEFT ULNAR AT ELBOW;  Surgeon: Cindee Salt, MD;  Location: [facility];  Service: Orthopedics;  Laterality: Left;  axillary block    There were no vitals filed for this visit.    Subjective Assessment - 06/07/21 0932     Subjective COVID-19 screen performed prior to patient entering clinic.  The patient presents to the clinic today having injured his right knee due to a twaiting injury on 01/25/2020.  He underwent a right knee arthroscopic performed 04/18/21.  His suregry involved meniscectomies, chondroplasty and microfracturing (please refer to operative report).  He limited his weight bearing over his riht LE until 06/01/21.  Today, he presented to the clinic with a straight cane and wbat over his right LE.  His pain is a low 2/10 at rest today.  Increased standing and walking increases his pain.    Pertinent History Left CTR, left elbow, right shoulder and lumbar surgery.    How long can you walk comfortably? Just began wbat over his right LE 0n 06/01/21 using a cane.  Not walking long distances.    Patient Stated Goals Would like to avoid a total knee replacement.    Currently in Pain? Yes    Pain Score 2  Pain Location Knee    Pain Orientation Right    Pain Descriptors / Indicators Aching    Pain Type Chronic pain    Pain Onset More than a month ago    Aggravating Factors  See above.    Pain Relieving Factors Rest.                [facility] PT Assessment - 06/07/21 0001       Assessment   Medical Diagnosis Right knee arthroscopic surgery    Referring Provider (PT) Jene Every MD    Onset Date/Surgical Date --   04/18/21 (surgery date).     Precautions   Precaution Comments PAIN-FREE RIGHT LE THER EX.      Restrictions   Weight Bearing Restrictions --   Patient wbat over right  LE with a straight cane.     Balance Screen   Has the patient fallen in the past 6 months No    Has the patient had a decrease in activity level because of a fear of falling?  No    Is the patient reluctant to leave their home because of a fear of falling?  No      Home [employer] information [employer] manager residence      Prior Function   Level of Independence Independent      Observation/Other Assessments-Edema    Edema --   RT 2 cms > LT.     ROM / Strength   AROM / PROM / Strength AROM;Strength      AROM   Overall AROM Comments Right knee AROM:  0 to 105 degrees.  Left knee is 115 degrees.      Strength   Overall Strength Comments Right hip strength is 5/5.  Patient easily able to perform a right SAQ without difficulty or pain increase.      Palpation   Palpation comment No notable areas of right right tenderness.      Ambulation/Gait   Gait Comments Slow and purposeful with a tendency for right LE ER with patient using a straight cane and wbat over his right LE.                        Objective measurements completed on examination: See above findings.       [facility] Adult PT Treatment/Exercise - 06/07/21 0001       Modalities   Modalities Vasopneumatic      Vasopneumatic   Number Minutes Vasopneumatic  10 minutes    Vasopnuematic Location  --   Right knee.   Vasopneumatic Pressure Low                         PT Long Term Goals - 06/07/21 1043       PT LONG TERM GOAL #1   Title Patient will be independent with HEP and progression.    Time 4    Period Weeks    Status New      PT LONG TERM GOAL #2   Title Patient walk a community distance with pain not > 2-3/10.    Time 4    Period Weeks    Status New      PT LONG TERM GOAL #3   Title Achieve 115 of active right knee flexion without pain increase.    Time 4    Period Weeks    Status New      PT  LONG TERM GOAL #4   Title Patient perform a reciprocating stair gait  with one railing.    Time 4    Period Weeks    Status New      PT LONG TERM GOAL #5   Title Walk a community distance with right knee pain > 2-3/10.    Time 4    Period Weeks    Status New                    Plan - 06/07/21 0949     Clinical Impression Statement The patient presents to OPPT s/p right knee arthrocopic surgery performed on 04/18/21.  He presented to the clinic today with a straight cane and wbat over his right LE.  He is doing quite well thus far and lacks just 10 degrees of right knee active flexion when contralaterally compared.  He has minimal right knee edema.  He can easily perform a right short arc quad without pain increase.  Patient will benefit from skilled physical therapy intervention to address pain and deficits.    Personal Factors and Comorbidities Comorbidity 1;Other    Comorbidities Left CTR, left elbow, right shoulder and lumbar surgery.    Examination-Activity Limitations Other;Locomotion Level    Examination-Participation Restrictions Other    Stability/Clinical Decision Making Stable/Uncomplicated    Clinical Decision Making Low    Rehab Potential Excellent    PT Duration 4 weeks    PT Treatment/Interventions ADLs/Self Care Home Management;Cryotherapy;Electrical Stimulation;Moist Heat;Gait training;Stair training;Functional mobility training;Therapeutic activities;Therapeutic exercise;Manual techniques;Patient/family education;Passive range of motion    PT Next Visit Plan PLEASE DO FOTO....Marland KitchenPAIN-FREE RIGHT LE THER EX.  Vasopneumatic.    Consulted and Agree with Plan of Care Patient             Patient will benefit from skilled therapeutic intervention in order to improve the following deficits and impairments:  Abnormal gait, Decreased activity tolerance, Pain, Increased edema  Visit Diagnosis: Chronic pain of right knee - Plan: PT plan of care cert/re-cert  Localized edema - Plan: PT plan of care cert/re-cert  Stiffness of right  knee, not elsewhere classified - Plan: PT plan of care cert/re-cert     Problem List Patient Active Problem List   Diagnosis Date Noted   Guaiac positive stools 10/24/2016   Precordial pain 12/30/2012   HTN (hypertension)    Diabetes mellitus type 2, noninsulin dependent (HCC)    Hyperlipidemia    Morbid obesity (HCC)    CKD (chronic kidney disease) stage 3, GFR 30-59 ml/min (HCC)    Anginal pain (HCC) 12/29/2012    Adrienne Delay, [place] MPT 06/07/2021, 10:46 AM  [facility] [address] [place], [place], [address] Phone: [phone]   Fax:  [phone]  Name: [name] MRN: [id number] Date of Birth: 16-Jun-1961

## 2021-06-11 ENCOUNTER — Ambulatory Visit: Payer: Medicare Other

## 2021-06-11 ENCOUNTER — Other Ambulatory Visit: Payer: Self-pay

## 2021-06-11 DIAGNOSIS — M6281 Muscle weakness (generalized): Secondary | ICD-10-CM

## 2021-06-11 DIAGNOSIS — G8929 Other chronic pain: Secondary | ICD-10-CM

## 2021-06-11 DIAGNOSIS — R600 Localized edema: Secondary | ICD-10-CM

## 2021-06-11 DIAGNOSIS — R262 Difficulty in walking, not elsewhere classified: Secondary | ICD-10-CM

## 2021-06-11 DIAGNOSIS — M25661 Stiffness of right knee, not elsewhere classified: Secondary | ICD-10-CM

## 2021-06-11 DIAGNOSIS — M25561 Pain in right knee: Secondary | ICD-10-CM | POA: Diagnosis not present

## 2021-06-11 NOTE — Therapy (Signed)
[facility] [address] [place], [place], [address] Phone: [phone]   Fax:  [phone]  Physical Therapy Treatment  Patient Details  Name: [name] MRN: [id number] Date of Birth: Mar 29, 1961 Referring Provider (PT): Jene Every MD   Encounter Date: 06/11/2021   PT End of Session - 06/11/21 0943     Visit Number 2    Number of Visits 12    Date for PT Re-Evaluation 07/05/21             Past Medical History:  Diagnosis Date   Anginal pain (HCC) 12/29/2012   CKD (chronic kidney disease) stage 3, GFR 30-59 ml/min (HCC)    Diabetes mellitus type 2, noninsulin dependent (HCC)    GERD (gastroesophageal reflux disease)    H/O hiatal hernia    Headache(784.0)    "not daily, not weekly, but very often" (12/30/2012)   HTN (hypertension)    Hyperlipidemia    Morbid obesity (HCC)    Osteoarthritis    "hands" (12/30/2012)    Past Surgical History:  Procedure Laterality Date   CARPAL TUNNEL RELEASE Left 01/08/2018   Procedure: LEFT CARPAL TUNNEL RELEASE;  Surgeon: Cindee Salt, MD;  Location: [facility];  Service: Orthopedics;  Laterality: Left;  axillary block   ELBOW ARTHROPLASTY Left 1990's   "3 spurs; ?14 staples" (12/30/2012)   KNEE ARTHROSCOPY WITH SUBCHONDROPLASTY Right 04/18/2021   Procedure: KNEE ARTHROSCOPY WITH PARTIAL MEDIAL AND LATERAL MENISCECTOMY AND DEBRIDEMENT,MICROFRACTURE medial tibial plateau;  Surgeon: Jene Every, MD;  Location: [facility];  Service: Orthopedics;  Laterality: Right;    LEFT HEART CATHETERIZATION WITH CORONARY ANGIOGRAM N/A 12/31/2012   Procedure: LEFT HEART CATHETERIZATION WITH CORONARY ANGIOGRAM;  Surgeon: Tonny Bollman, MD;  Location: [facility] CATH LAB;  Service: Cardiovascular;  Laterality: N/A;   LUMBAR DISC SURGERY  2010; 2011   "2 ruptured discs; 2 pinched nerves"   SHOULDER SURGERY Right    ULNAR NERVE TRANSPOSITION Left 01/08/2018   Procedure: DECOMPRESSION LEFT  ULNAR AT ELBOW;  Surgeon: Cindee Salt, MD;  Location: [facility];  Service: Orthopedics;  Laterality: Left;  axillary block    There were no vitals filed for this visit.   Subjective Assessment - 06/11/21 0902     Subjective COVID-19 screen performed prior to patient entering clinic.  He reports having a hard time getting going first this ing the morning.    Pertinent History Left CTR, left elbow, right shoulder and lumbar surgery.    How long can you walk comfortably? Just began wbat over his right LE 0n 06/01/21 using a cane.  Not walking long distances.    Patient Stated Goals Would like to avoid a total knee replacement.    Currently in Pain? Yes    Pain Score 2     Pain Location Knee    Pain Orientation Right    Pain Descriptors / Indicators Aching    Pain Onset More than a month ago                [facility] PT Assessment - 06/11/21 0001       Observation/Other Assessments   Focus on Therapeutic Outcomes (FOTO)  60%               [facility] Adult PT Treatment/Exercise - 06/11/21 0001       Exercises   Exercises Knee/Hip      Knee/Hip Exercises: Stretches   Active Hamstring Stretch Both;3 reps;10 seconds    Active  Hamstring Stretch Limitations seated with hip hinge totolerance - needs to bend knee to reduce tension mildly      Knee/Hip Exercises: Aerobic   Nustep 18 mins lvl 4      Knee/Hip Exercises: Seated   Long Arc Quad 15 reps    Long Arc Quad Weight 0 lbs.      Modalities   Modalities Vasopneumatic      Vasopneumatic   Number Minutes Vasopneumatic  15 minutes    Vasopnuematic Location  Knee    Vasopneumatic Pressure Low    Vasopneumatic Temperature  34      Manual Therapy   Manual Therapy Joint mobilization;Soft tissue mobilization    Manual therapy comments seated Edge of plinth              PT Long Term Goals - 06/07/21 1043       PT LONG TERM GOAL #1   Title Patient will be independent with HEP and progression.    Time 4     Period Weeks    Status New      PT LONG TERM GOAL #2   Title Patient walk a community distance with pain not > 2-3/10.    Time 4    Period Weeks    Status New      PT LONG TERM GOAL #3   Title Achieve 115 of active right knee flexion without pain increase.    Time 4    Period Weeks    Status New      PT LONG TERM GOAL #4   Title Patient perform a reciprocating stair gait with one railing.    Time 4    Period Weeks    Status New      PT LONG TERM GOAL #5   Title Walk a community distance with right knee pain > 2-3/10.    Time 4    Period Weeks    Status New                   Plan - 06/11/21 7124     Clinical Impression Statement Patient with fair tolerance to therapy with great response to manual interventions. He reports a decrease of discomfort behinf the knee and states that it feels a little better with the straight leg raises. He continues to demonstrate a slow and guarded pace with gait using a SPC. With further progression of PT, anticipation of reduced deficits to follow.    Personal Factors and Comorbidities Comorbidity 1;Other    Comorbidities Left CTR, left elbow, right shoulder and lumbar surgery.    Examination-Activity Limitations Other;Locomotion Level    Examination-Participation Restrictions Other    Stability/Clinical Decision Making Stable/Uncomplicated    Clinical Decision Making Low    Rehab Potential Excellent    PT Duration 4 weeks    PT Treatment/Interventions ADLs/Self Care Home Management;Cryotherapy;Electrical Stimulation;Moist Heat;Gait training;Stair training;Functional mobility training;Therapeutic activities;Therapeutic exercise;Manual techniques;Patient/family education;Passive range of motion    PT Next Visit Plan continue PAIN-FREE RIGHT LE THER EX.  Vasopneumatic.    Consulted and Agree with Plan of Care Patient             Patient will benefit from skilled therapeutic intervention in order to improve the following  deficits and impairments:  Abnormal gait, Decreased activity tolerance, Pain, Increased edema  Visit Diagnosis: Chronic pain of right knee  Localized edema  Stiffness of right knee, not elsewhere classified  Difficulty in walking, not elsewhere classified  Muscle weakness (generalized)  Problem List Patient Active Problem List   Diagnosis Date Noted   Guaiac positive stools 10/24/2016   Precordial pain 12/30/2012   HTN (hypertension)    Diabetes mellitus type 2, noninsulin dependent (HCC)    Hyperlipidemia    Morbid obesity (HCC)    CKD (chronic kidney disease) stage 3, GFR 30-59 ml/min (HCC)    Anginal pain (HCC) 12/29/2012    Levonne Spiller PT, DPT 06/11/2021, 9:47 AM  [facility] [address] [place], [place], [address] Phone: [phone]   Fax:  [phone]  Name: [name] MRN: [id number] Date of Birth: 1961-04-25

## 2021-06-13 ENCOUNTER — Other Ambulatory Visit: Payer: Self-pay

## 2021-06-13 ENCOUNTER — Ambulatory Visit: Payer: Medicare Other | Admitting: Physical Therapy

## 2021-06-13 DIAGNOSIS — R600 Localized edema: Secondary | ICD-10-CM

## 2021-06-13 DIAGNOSIS — M25561 Pain in right knee: Secondary | ICD-10-CM | POA: Diagnosis not present

## 2021-06-13 DIAGNOSIS — M25661 Stiffness of right knee, not elsewhere classified: Secondary | ICD-10-CM

## 2021-06-13 DIAGNOSIS — G8929 Other chronic pain: Secondary | ICD-10-CM

## 2021-06-13 NOTE — Therapy (Signed)
[facility] [address] [place], [place], [address] Phone: [phone]   Fax:  [phone]  Physical Therapy Treatment  Patient Details  Name: [name] MRN: [id number] Date of Birth: 1960/10/19 Referring Provider (PT): Jene Every MD   Encounter Date: 06/13/2021   PT End of Session - 06/13/21 0853     Visit Number 3    Number of Visits 12    Date for PT Re-Evaluation 07/05/21    PT Start Time 0900    PT Stop Time 0944    PT Time Calculation (min) 44 min    Activity Tolerance Patient tolerated treatment well    Behavior During Therapy [facility] for tasks assessed/performed             Past Medical History:  Diagnosis Date   Anginal pain (HCC) 12/29/2012   CKD (chronic kidney disease) stage 3, GFR 30-59 ml/min (HCC)    Diabetes mellitus type 2, noninsulin dependent (HCC)    GERD (gastroesophageal reflux disease)    H/O hiatal hernia    Headache(784.0)    "not daily, not weekly, but very often" (12/30/2012)   HTN (hypertension)    Hyperlipidemia    Morbid obesity (HCC)    Osteoarthritis    "hands" (12/30/2012)    Past Surgical History:  Procedure Laterality Date   CARPAL TUNNEL RELEASE Left 01/08/2018   Procedure: LEFT CARPAL TUNNEL RELEASE;  Surgeon: Cindee Salt, MD;  Location: [facility];  Service: Orthopedics;  Laterality: Left;  axillary block   ELBOW ARTHROPLASTY Left 1990's   "3 spurs; ?14 staples" (12/30/2012)   KNEE ARTHROSCOPY WITH SUBCHONDROPLASTY Right 04/18/2021   Procedure: KNEE ARTHROSCOPY WITH PARTIAL MEDIAL AND LATERAL MENISCECTOMY AND DEBRIDEMENT,MICROFRACTURE medial tibial plateau;  Surgeon: Jene Every, MD;  Location: [facility];  Service: Orthopedics;  Laterality: Right;    LEFT HEART CATHETERIZATION WITH CORONARY ANGIOGRAM N/A 12/31/2012   Procedure: LEFT HEART CATHETERIZATION WITH CORONARY ANGIOGRAM;  Surgeon: Tonny Bollman, MD;  Location[facility];  Service: Cardiovascular;   Laterality: N/A;   LUMBAR DISC SURGERY  2010; 2011   "2 ruptured discs; 2 pinched nerves"   SHOULDER SURGERY Right    ULNAR NERVE TRANSPOSITION Left 01/08/2018   Procedure: DECOMPRESSION LEFT ULNAR AT ELBOW;  Surgeon: Cindee Salt, MD;  Location: [facility];  Service: Orthopedics;  Laterality: Left;  axillary block    There were no vitals filed for this visit.   Subjective Assessment - 06/13/21 0853     Subjective COVID-19 screen performed prior to patient entering clinic.  Doing well.    Pertinent History Left CTR, left elbow, right shoulder and lumbar surgery.    How long can you walk comfortably? Just began wbat over his right LE 0n 06/01/21 using a cane.  Not walking long distances.    Patient Stated Goals Would like to avoid a total knee replacement.    Currently in Pain? Yes    Pain Score 2     Pain Location Knee    Pain Orientation Right    Pain Descriptors / Indicators Aching    Pain Type Chronic pain    Pain Onset More than a month ago                               [facility] Adult PT Treatment/Exercise - 06/13/21 0001       Exercises   Exercises Knee/Hip  Knee/Hip Exercises: Aerobic   Nustep Level 3 x 15 minutes.      Knee/Hip Exercises: Supine   Short Arc Quad Sets Limitations 3# x 5 minutes with 5 sec extension holds.      Vasopneumatic   Number Minutes Vasopneumatic  15 minutes    Vasopnuematic Location  --   Right knee.   Vasopneumatic Pressure Low      Manual Therapy   Manual Therapy Passive ROM    Passive ROM In supine:  Gentle PROM x 3 minutes to patient's right knee into flexion.                         PT Long Term Goals - 06/07/21 1043       PT LONG TERM GOAL #1   Title Patient will be independent with HEP and progression.    Time 4    Period Weeks    Status New      PT LONG TERM GOAL #2   Title Patient walk a community distance with pain not > 2-3/10.    Time 4    Period Weeks    Status  New      PT LONG TERM GOAL #3   Title Achieve 115 of active right knee flexion without pain increase.    Time 4    Period Weeks    Status New      PT LONG TERM GOAL #4   Title Patient perform a reciprocating stair gait with one railing.    Time 4    Period Weeks    Status New      PT LONG TERM GOAL #5   Title Walk a community distance with right knee pain > 2-3/10.    Time 4    Period Weeks    Status New                   Plan - 06/13/21 0856     Clinical Impression Statement Patient did very well with treatment today.  SAQ's performed with 3# were pain-free.    Personal Factors and Comorbidities Comorbidity 1;Other    Comorbidities Left CTR, left elbow, right shoulder and lumbar surgery.    Stability/Clinical Decision Making Stable/Uncomplicated    Rehab Potential Excellent    PT Duration 4 weeks    PT Treatment/Interventions ADLs/Self Care Home Management;Cryotherapy;Electrical Stimulation;Moist Heat;Gait training;Stair training;Functional mobility training;Therapeutic activities;Therapeutic exercise;Manual techniques;Patient/family education;Passive range of motion    PT Next Visit Plan continue PAIN-FREE RIGHT LE THER EX.  Vasopneumatic.    Consulted and Agree with Plan of Care Patient             Patient will benefit from skilled therapeutic intervention in order to improve the following deficits and impairments:  Abnormal gait, Decreased activity tolerance, Pain, Increased edema  Visit Diagnosis: Chronic pain of right knee  Localized edema  Stiffness of right knee, not elsewhere classified     Problem List Patient Active Problem List   Diagnosis Date Noted   Guaiac positive stools 10/24/2016   Precordial pain 12/30/2012   HTN (hypertension)    Diabetes mellitus type 2, noninsulin dependent (HCC)    Hyperlipidemia    Morbid obesity (HCC)    CKD (chronic kidney disease) stage 3, GFR 30-59 ml/min (HCC)    Anginal pain (HCC) 12/29/2012     Skye Rodarte, [place] MPT 06/13/2021, 9:19 AM  [facility] [address] [place], [place], [address] Phone:  [phone]   Fax:  [phone]  Name: [name] MRN: [id number] Date of Birth: 1961-09-30

## 2021-06-20 ENCOUNTER — Other Ambulatory Visit: Payer: Self-pay

## 2021-06-20 ENCOUNTER — Ambulatory Visit: Payer: Medicare Other | Attending: Specialist | Admitting: Physical Therapy

## 2021-06-20 DIAGNOSIS — R600 Localized edema: Secondary | ICD-10-CM | POA: Insufficient documentation

## 2021-06-20 DIAGNOSIS — M25661 Stiffness of right knee, not elsewhere classified: Secondary | ICD-10-CM | POA: Insufficient documentation

## 2021-06-20 DIAGNOSIS — M6281 Muscle weakness (generalized): Secondary | ICD-10-CM | POA: Insufficient documentation

## 2021-06-20 DIAGNOSIS — G8929 Other chronic pain: Secondary | ICD-10-CM | POA: Diagnosis present

## 2021-06-20 DIAGNOSIS — R262 Difficulty in walking, not elsewhere classified: Secondary | ICD-10-CM | POA: Insufficient documentation

## 2021-06-20 DIAGNOSIS — M25561 Pain in right knee: Secondary | ICD-10-CM | POA: Diagnosis not present

## 2021-06-20 NOTE — Therapy (Signed)
[facility] [address] [place], [place], [address] Phone: [phone]   Fax:  [phone]  Physical Therapy Treatment  Patient Details  Name: [name] MRN: [id number] Date of Birth: 1961/05/07 Referring Provider (PT): Jene Every MD   Encounter Date: 06/20/2021   PT End of Session - 06/20/21 0819     Visit Number 4    Number of Visits 12    Date for PT Re-Evaluation 07/05/21    PT Start Time 0815    PT Stop Time 0858    PT Time Calculation (min) 43 min    Activity Tolerance Patient tolerated treatment well    Behavior During Therapy [facility] for tasks assessed/performed             Past Medical History:  Diagnosis Date   Anginal pain (HCC) 12/29/2012   CKD (chronic kidney disease) stage 3, GFR 30-59 ml/min (HCC)    Diabetes mellitus type 2, noninsulin dependent (HCC)    GERD (gastroesophageal reflux disease)    H/O hiatal hernia    Headache(784.0)    "not daily, not weekly, but very often" (12/30/2012)   HTN (hypertension)    Hyperlipidemia    Morbid obesity (HCC)    Osteoarthritis    "hands" (12/30/2012)    Past Surgical History:  Procedure Laterality Date   CARPAL TUNNEL RELEASE Left 01/08/2018   Procedure: LEFT CARPAL TUNNEL RELEASE;  Surgeon: Cindee Salt, MD;  Location: [facility];  Service: Orthopedics;  Laterality: Left;  axillary block   ELBOW ARTHROPLASTY Left 1990's   "3 spurs; ?14 staples" (12/30/2012)   KNEE ARTHROSCOPY WITH SUBCHONDROPLASTY Right 04/18/2021   Procedure: KNEE ARTHROSCOPY WITH PARTIAL MEDIAL AND LATERAL MENISCECTOMY AND DEBRIDEMENT,MICROFRACTURE medial tibial plateau;  Surgeon: Jene Every, MD;  Location: [facility];  Service: Orthopedics;  Laterality: Right;    LEFT HEART CATHETERIZATION WITH CORONARY ANGIOGRAM N/A 12/31/2012   Procedure: LEFT HEART CATHETERIZATION WITH CORONARY ANGIOGRAM;  Surgeon: Tonny Bollman, MD;  Location: [facility];  Service: Cardiovascular;   Laterality: N/A;   LUMBAR DISC SURGERY  2010; 2011   "2 ruptured discs; 2 pinched nerves"   SHOULDER SURGERY Right    ULNAR NERVE TRANSPOSITION Left 01/08/2018   Procedure: DECOMPRESSION LEFT ULNAR AT ELBOW;  Surgeon: Cindee Salt, MD;  Location: [facility];  Service: Orthopedics;  Laterality: Left;  axillary block    There were no vitals filed for this visit.   Subjective Assessment - 06/20/21 0815     Subjective COVID-19 screen performed prior to patient entering clinic.  Patient reported just minimal discomfort posterior right knee.    Pertinent History Left CTR, left elbow, right shoulder and lumbar surgery.    How long can you walk comfortably? Just began wbat over his right LE 0n 06/01/21 using a cane.  Not walking long distances.    Patient Stated Goals Would like to avoid a total knee replacement.    Currently in Pain? Yes    Pain Score 2     Pain Location Knee    Pain Orientation Right;Posterior    Pain Descriptors / Indicators Discomfort;Aching    Pain Type Chronic pain    Pain Onset More than a month ago    Pain Frequency Intermittent    Aggravating Factors  prolong activity    Pain Relieving Factors at rest                [facility] PT Assessment - 06/20/21 0001  ROM / Strength   AROM / PROM / Strength AROM;PROM      AROM   AROM Assessment Site Knee    Right/Left Knee Right    Right Knee Flexion 111      PROM   PROM Assessment Site Knee    Right/Left Knee Right    Right Knee Flexion 115                           [facility] Adult PT Treatment/Exercise - 06/20/21 0001       Knee/Hip Exercises: Aerobic   Nustep L3 x28min UE/LE activity      Knee/Hip Exercises: Standing   Hip Abduction Stengthening;Right;2 sets;10 reps;Knee straight    Abduction Limitations w red band    Rocker Board 2 minutes      Knee/Hip Exercises: Seated   Long Arc Quad Strengthening;Right;3 sets;10 reps;Weights    Long Arc Quad Weight 3 lbs.       Vasopneumatic   Number Minutes Vasopneumatic  15 minutes    Vasopnuematic Location  Knee   Right   Vasopneumatic Pressure Low    Vasopneumatic Temperature  34      Manual Therapy   Manual Therapy Passive ROM    Passive ROM manual PROM for right knee flexion to improve mobility                  Upper Extremity Functional Index Score :   /80        PT Long Term Goals - 06/20/21 0815       PT LONG TERM GOAL #1   Title Patient will be independent with HEP and progression.    Time 4    Period Weeks    Status On-going      PT LONG TERM GOAL #2   Title Patient walk a community distance with pain not > 2-3/10.    Time 4    Period Weeks    Status On-going      PT LONG TERM GOAL #3   Title Achieve 115 of active right knee flexion without pain increase.    Baseline AROM 111 degrees 06/20/21    Time 4    Period Weeks    Status On-going      PT LONG TERM GOAL #4   Title Patient perform a reciprocating stair gait with one railing.    Time 4    Period Weeks    Status On-going      PT LONG TERM GOAL #5   Title Walk a community distance with right knee pain > 2-3/10.    Time 4    Period Weeks    Status On-going                   Plan - 06/20/21 0844     Clinical Impression Statement Patient tolerated treatment well today. Patient able to progress with PRE's for right LE today with minimal discomfort. Patient has improved with PROM in right knee for flexion both active and passive today. Patient goals progressing.    Personal Factors and Comorbidities Comorbidity 1;Other    Comorbidities Left CTR, left elbow, right shoulder and lumbar surgery.    Examination-Activity Limitations Other;Locomotion Level    Examination-Participation Restrictions Other    Stability/Clinical Decision Making Stable/Uncomplicated    Rehab Potential Excellent    PT Duration 4 weeks    PT Treatment/Interventions ADLs/Self Care Home Management;Cryotherapy;Electrical  Stimulation;Moist Heat;Gait training;Stair training;Functional  mobility training;Therapeutic activities;Therapeutic exercise;Manual techniques;Patient/family education;Passive range of motion    PT Next Visit Plan continue PAIN-FREE RIGHT LE THER EX.  Vasopneumatic.    Consulted and Agree with Plan of Care Patient             Patient will benefit from skilled therapeutic intervention in order to improve the following deficits and impairments:  Abnormal gait, Decreased activity tolerance, Pain, Increased edema  Visit Diagnosis: Chronic pain of right knee  Localized edema  Stiffness of right knee, not elsewhere classified  Difficulty in walking, not elsewhere classified  Muscle weakness (generalized)     Problem List Patient Active Problem List   Diagnosis Date Noted   Guaiac positive stools 10/24/2016   Precordial pain 12/30/2012   HTN (hypertension)    Diabetes mellitus type 2, noninsulin dependent (HCC)    Hyperlipidemia    Morbid obesity (HCC)    CKD (chronic kidney disease) stage 3, GFR 30-59 ml/min (HCC)    Anginal pain (HCC) 12/29/2012    Kalesha Irving P, PTA 06/20/2021, 8:58 AM  [facility] [address] [place], [place], [address] Phone: [phone]   Fax:  [phone]  Name: [name] MRN: [id number] Date of Birth: 1961/09/30

## 2021-06-22 ENCOUNTER — Ambulatory Visit: Payer: Medicare Other | Admitting: *Deleted

## 2021-06-22 ENCOUNTER — Other Ambulatory Visit: Payer: Self-pay

## 2021-06-22 DIAGNOSIS — R262 Difficulty in walking, not elsewhere classified: Secondary | ICD-10-CM

## 2021-06-22 DIAGNOSIS — R600 Localized edema: Secondary | ICD-10-CM

## 2021-06-22 DIAGNOSIS — M25661 Stiffness of right knee, not elsewhere classified: Secondary | ICD-10-CM

## 2021-06-22 DIAGNOSIS — G8929 Other chronic pain: Secondary | ICD-10-CM

## 2021-06-22 DIAGNOSIS — M6281 Muscle weakness (generalized): Secondary | ICD-10-CM

## 2021-06-22 DIAGNOSIS — M25561 Pain in right knee: Secondary | ICD-10-CM | POA: Diagnosis not present

## 2021-06-22 NOTE — Therapy (Signed)
[facility] [address] [place], [place], [address] Phone: [phone]   Fax:  [phone]  Physical Therapy Treatment  Patient Details  Name: [name] MRN: [id number] Date of Birth: 1960/12/13 Referring Provider (PT): Jene Every MD   Encounter Date: 06/22/2021   PT End of Session - 06/22/21 0831     Visit Number 5    Number of Visits 12    Date for PT Re-Evaluation 07/05/21    PT Start Time 0815    PT Stop Time 0908    PT Time Calculation (min) 53 min             Past Medical History:  Diagnosis Date   Anginal pain (HCC) 12/29/2012   CKD (chronic kidney disease) stage 3, GFR 30-59 ml/min (HCC)    Diabetes mellitus type 2, noninsulin dependent (HCC)    GERD (gastroesophageal reflux disease)    H/O hiatal hernia    Headache(784.0)    "not daily, not weekly, but very often" (12/30/2012)   HTN (hypertension)    Hyperlipidemia    Morbid obesity (HCC)    Osteoarthritis    "hands" (12/30/2012)    Past Surgical History:  Procedure Laterality Date   CARPAL TUNNEL RELEASE Left 01/08/2018   Procedure: LEFT CARPAL TUNNEL RELEASE;  Surgeon: Cindee Salt, MD;  Location: [facility];  Service: Orthopedics;  Laterality: Left;  axillary block   ELBOW ARTHROPLASTY Left 1990's   "3 spurs; ?14 staples" (12/30/2012)   KNEE ARTHROSCOPY WITH SUBCHONDROPLASTY Right 04/18/2021   Procedure: KNEE ARTHROSCOPY WITH PARTIAL MEDIAL AND LATERAL MENISCECTOMY AND DEBRIDEMENT,MICROFRACTURE medial tibial plateau;  Surgeon: Jene Every, MD;  Location: [facility];  Service: Orthopedics;  Laterality: Right;    LEFT HEART CATHETERIZATION WITH CORONARY ANGIOGRAM N/A 12/31/2012   Procedure: LEFT HEART CATHETERIZATION WITH CORONARY ANGIOGRAM;  Surgeon: Tonny Bollman, MD;  Location: [facility] CATH LAB;  Service: Cardiovascular;  Laterality: N/A;   LUMBAR DISC SURGERY  2010; 2011   "2 ruptured discs; 2 pinched nerves"   SHOULDER SURGERY Right     ULNAR NERVE TRANSPOSITION Left 01/08/2018   Procedure: DECOMPRESSION LEFT ULNAR AT ELBOW;  Surgeon: Cindee Salt, MD;  Location: [facility];  Service: Orthopedics;  Laterality: Left;  axillary block    There were no vitals filed for this visit.   Subjective Assessment - 06/22/21 0829     Subjective COVID-19 screen performed prior to patient entering clinic.  Patient reported very sore after last RX posterior right knee.    Pertinent History Left CTR, left elbow, right shoulder and lumbar surgery.    How long can you walk comfortably? Just began wbat over his right LE 0n 06/01/21 using a cane.  Not walking long distances.    Patient Stated Goals Would like to avoid a total knee replacement.    Currently in Pain? Yes    Pain Score 2     Pain Location Knee    Pain Orientation Right    Pain Descriptors / Indicators Aching;Discomfort    Pain Type Chronic pain    Pain Onset More than a month ago                               [facility] Adult PT Treatment/Exercise - 06/22/21 0001       Exercises   Exercises Knee/Hip      Knee/Hip Exercises: Aerobic   Nustep L5 x45min UE/LE activity  Knee/Hip Exercises: Standing   Heel Raises Both;2 sets;10 reps    Heel Raises Limitations Toe ups 2x10    Hip Abduction Stengthening;Right;2 sets;10 reps;Knee straight    Rocker Board 2 minutes      Knee/Hip Exercises: Seated   Long Arc Quad Strengthening;Right;10 reps;Weights;2 sets    Long Arc Quad Weight 3 lbs.   hold 3 secs     Modalities   Modalities Vasopneumatic;Electrical Stimulation      Electrical Stimulation   Electrical Stimulation Location RT knee    Electrical Stimulation Action IFC    Electrical Stimulation Parameters 80-150hz  x 15 mins    Electrical Stimulation Goals Pain      Vasopneumatic   Number Minutes Vasopneumatic  15 minutes    Vasopnuematic Location  Knee   Right   Vasopneumatic Pressure Low    Vasopneumatic Temperature  34       Manual Therapy   Manual therapy comments supine flexion 125 degrees today                          PT Long Term Goals - 06/20/21 0815       PT LONG TERM GOAL #1   Title Patient will be independent with HEP and progression.    Time 4    Period Weeks    Status On-going      PT LONG TERM GOAL #2   Title Patient walk a community distance with pain not > 2-3/10.    Time 4    Period Weeks    Status On-going      PT LONG TERM GOAL #3   Title Achieve 115 of active right knee flexion without pain increase.    Baseline AROM 111 degrees 06/20/21    Time 4    Period Weeks    Status On-going      PT LONG TERM GOAL #4   Title Patient perform a reciprocating stair gait with one railing.    Time 4    Period Weeks    Status On-going      PT LONG TERM GOAL #5   Title Walk a community distance with right knee pain > 2-3/10.    Time 4    Period Weeks    Status On-going                   Plan - 06/22/21 3818     Clinical Impression Statement Pt arrived today doing fairly well, but reports increased soreness after last Rx. Rx focused on some OKC/ CKC exs for RT knee and tolerated well. Normal Vaso/estim response end of session. AROM flexion to 125 degrees    Personal Factors and Comorbidities Comorbidity 1;Other    Comorbidities Left CTR, left elbow, right shoulder and lumbar surgery.    Examination-Activity Limitations Other;Locomotion Level    Examination-Participation Restrictions Other    Stability/Clinical Decision Making Stable/Uncomplicated    Rehab Potential Excellent    PT Duration 4 weeks    PT Treatment/Interventions ADLs/Self Care Home Management;Cryotherapy;Electrical Stimulation;Moist Heat;Gait training;Stair training;Functional mobility training;Therapeutic activities;Therapeutic exercise;Manual techniques;Patient/family education;Passive range of motion    PT Next Visit Plan continue PAIN-FREE RIGHT LE THER EX.  Vasopneumatic.              Patient will benefit from skilled therapeutic intervention in order to improve the following deficits and impairments:  Abnormal gait, Decreased activity tolerance, Pain, Increased edema  Visit Diagnosis: Chronic pain of right knee  Localized edema  Stiffness of right knee, not elsewhere classified  Difficulty in walking, not elsewhere classified  Muscle weakness (generalized)     Problem List Patient Active Problem List   Diagnosis Date Noted   Guaiac positive stools 10/24/2016   Precordial pain 12/30/2012   HTN (hypertension)    Diabetes mellitus type 2, noninsulin dependent (HCC)    Hyperlipidemia    Morbid obesity (HCC)    CKD (chronic kidney disease) stage 3, GFR 30-59 ml/min (HCC)    Anginal pain (HCC) 12/29/2012    Izabel Chim,[name], PTA 06/22/2021, 9:45 AM  [facility] [address] [place], [place], [address] Phone: [phone]   Fax:  [phone]  Name: [name] MRN: [id number] Date of Birth: [birth year]

## 2021-06-25 ENCOUNTER — Ambulatory Visit: Payer: Medicare Other | Admitting: Physical Therapy

## 2021-06-25 ENCOUNTER — Other Ambulatory Visit: Payer: Self-pay

## 2021-06-25 DIAGNOSIS — R600 Localized edema: Secondary | ICD-10-CM

## 2021-06-25 DIAGNOSIS — M25561 Pain in right knee: Secondary | ICD-10-CM | POA: Diagnosis not present

## 2021-06-25 DIAGNOSIS — M25661 Stiffness of right knee, not elsewhere classified: Secondary | ICD-10-CM

## 2021-06-25 DIAGNOSIS — G8929 Other chronic pain: Secondary | ICD-10-CM

## 2021-06-25 NOTE — Therapy (Signed)
[facility] [address] [place], [place], [address] Phone: [phone]   Fax:  [phone]  Physical Therapy Treatment  Patient Details  Name: [name] MRN: [id number] Date of Birth: 1961-09-30 Referring Provider (PT): Jene Every MD   Encounter Date: 06/25/2021   PT End of Session - 06/25/21 1108     Visit Number 6    Number of Visits 12    Date for PT Re-Evaluation 07/05/21    PT Start Time 0945    PT Stop Time 1026    PT Time Calculation (min) 41 min    Activity Tolerance Patient tolerated treatment well    Behavior During Therapy [facility] for tasks assessed/performed             Past Medical History:  Diagnosis Date   Anginal pain (HCC) 12/29/2012   CKD (chronic kidney disease) stage 3, GFR 30-59 ml/min (HCC)    Diabetes mellitus type 2, noninsulin dependent (HCC)    GERD (gastroesophageal reflux disease)    H/O hiatal hernia    Headache(784.0)    "not daily, not weekly, but very often" (12/30/2012)   HTN (hypertension)    Hyperlipidemia    Morbid obesity (HCC)    Osteoarthritis    "hands" (12/30/2012)    Past Surgical History:  Procedure Laterality Date   CARPAL TUNNEL RELEASE Left 01/08/2018   Procedure: LEFT CARPAL TUNNEL RELEASE;  Surgeon: Cindee Salt, MD;  Location: [facility];  Service: Orthopedics;  Laterality: Left;  axillary block   ELBOW ARTHROPLASTY Left 1990's   "3 spurs; ?14 staples" (12/30/2012)   KNEE ARTHROSCOPY WITH SUBCHONDROPLASTY Right 04/18/2021   Procedure: KNEE ARTHROSCOPY WITH PARTIAL MEDIAL AND LATERAL MENISCECTOMY AND DEBRIDEMENT,MICROFRACTURE medial tibial plateau;  Surgeon: Jene Every, MD;  Location: [facility];  Service: Orthopedics;  Laterality: Right;    LEFT HEART CATHETERIZATION WITH CORONARY ANGIOGRAM N/A 12/31/2012   Procedure: LEFT HEART CATHETERIZATION WITH CORONARY ANGIOGRAM;  Surgeon: Tonny Bollman, MD;  Location: [facility];  Service: Cardiovascular;   Laterality: N/A;   LUMBAR DISC SURGERY  2010; 2011   "2 ruptured discs; 2 pinched nerves"   SHOULDER SURGERY Right    ULNAR NERVE TRANSPOSITION Left 01/08/2018   Procedure: DECOMPRESSION LEFT ULNAR AT ELBOW;  Surgeon: Cindee Salt, MD;  Location: [facility];  Service: Orthopedics;  Laterality: Left;  axillary block    There were no vitals filed for this visit.                      [facility] Adult PT Treatment/Exercise - 06/25/21 0001       Exercises   Exercises Knee/Hip      Knee/Hip Exercises: Aerobic   Nustep Level  x 15 minutes.      Knee/Hip Exercises: Supine   Short Arc Quad Sets Limitations 5# x 5 minutes.      Vasopneumatic   Number Minutes Vasopneumatic  15 minutes    Vasopnuematic Location  --   Right knee.   Vasopneumatic Pressure Low      Manual Therapy   Manual Therapy Passive ROM    Passive ROM In supine:  Gentle passive right knee flexion x 3 minutes.                          PT Long Term Goals - 06/20/21 0815       PT LONG TERM GOAL #1   Title Patient  will be independent with HEP and progression.    Time 4    Period Weeks    Status On-going      PT LONG TERM GOAL #2   Title Patient walk a community distance with pain not > 2-3/10.    Time 4    Period Weeks    Status On-going      PT LONG TERM GOAL #3   Title Achieve 115 of active right knee flexion without pain increase.    Baseline AROM 111 degrees 06/20/21    Time 4    Period Weeks    Status On-going      PT LONG TERM GOAL #4   Title Patient perform a reciprocating stair gait with one railing.    Time 4    Period Weeks    Status On-going      PT LONG TERM GOAL #5   Title Walk a community distance with right knee pain > 2-3/10.    Time 4    Period Weeks    Status On-going                   Plan - 06/25/21 1110     Clinical Impression Statement The patient is making very good progress.  He was able to perform a 5# short arc quad  with excellent technique and no pain increase.    Personal Factors and Comorbidities Comorbidity 1;Other    Comorbidities Left CTR, left elbow, right shoulder and lumbar surgery.    Examination-Activity Limitations Other;Locomotion Level    Examination-Participation Restrictions Other    Stability/Clinical Decision Making Stable/Uncomplicated    Rehab Potential Excellent    PT Duration 4 weeks    PT Treatment/Interventions ADLs/Self Care Home Management;Cryotherapy;Electrical Stimulation;Moist Heat;Gait training;Stair training;Functional mobility training;Therapeutic activities;Therapeutic exercise;Manual techniques;Patient/family education;Passive range of motion    PT Next Visit Plan continue PAIN-FREE RIGHT LE THER EX.  Vasopneumatic.    Consulted and Agree with Plan of Care Patient             Patient will benefit from skilled therapeutic intervention in order to improve the following deficits and impairments:  Abnormal gait, Decreased activity tolerance, Pain, Increased edema  Visit Diagnosis: Chronic pain of right knee  Localized edema  Stiffness of right knee, not elsewhere classified     Problem List Patient Active Problem List   Diagnosis Date Noted   Guaiac positive stools 10/24/2016   Precordial pain 12/30/2012   HTN (hypertension)    Diabetes mellitus type 2, noninsulin dependent (HCC)    Hyperlipidemia    Morbid obesity (HCC)    CKD (chronic kidney disease) stage 3, GFR 30-59 ml/min (HCC)    Anginal pain (HCC) 12/29/2012    [name], PT 06/25/2021, 11:12 AM  [facility] [address] [place], [place], [address] Phone: [phone]   Fax:  [phone]  Name: [name] MRN: [id number] Date of Birth: 31-Jan-1961

## 2021-06-27 ENCOUNTER — Ambulatory Visit: Payer: Medicare Other | Admitting: Physical Therapy

## 2021-06-27 ENCOUNTER — Encounter: Payer: Self-pay | Admitting: Physical Therapy

## 2021-06-27 ENCOUNTER — Other Ambulatory Visit: Payer: Self-pay

## 2021-06-27 DIAGNOSIS — M25561 Pain in right knee: Secondary | ICD-10-CM | POA: Diagnosis not present

## 2021-06-27 DIAGNOSIS — M25661 Stiffness of right knee, not elsewhere classified: Secondary | ICD-10-CM

## 2021-06-27 DIAGNOSIS — R600 Localized edema: Secondary | ICD-10-CM

## 2021-06-27 DIAGNOSIS — G8929 Other chronic pain: Secondary | ICD-10-CM

## 2021-06-27 NOTE — Therapy (Signed)
[facility] [address] [place], [place], [address] Phone: [phone]   Fax:  [phone]  Physical Therapy Treatment  Patient Details  Name: [name] MRN: [id number] Date of Birth: [birth year] Referring Provider (PT): Jene Every MD   Encounter Date: 06/27/2021   PT End of Session - 06/27/21 0857     Visit Number 7    Number of Visits 12    Date for PT Re-Evaluation 07/05/21    PT Start Time 0815    PT Stop Time 0859    PT Time Calculation (min) 44 min    Activity Tolerance Patient tolerated treatment well    Behavior During Therapy [facility] for tasks assessed/performed             Past Medical History:  Diagnosis Date   Anginal pain (HCC) 12/29/2012   CKD (chronic kidney disease) stage 3, GFR 30-59 ml/min (HCC)    Diabetes mellitus type 2, noninsulin dependent (HCC)    GERD (gastroesophageal reflux disease)    H/O hiatal hernia    Headache(784.0)    "not daily, not weekly, but very often" (12/30/2012)   HTN (hypertension)    Hyperlipidemia    Morbid obesity (HCC)    Osteoarthritis    "hands" (12/30/2012)    Past Surgical History:  Procedure Laterality Date   CARPAL TUNNEL RELEASE Left 01/08/2018   Procedure: LEFT CARPAL TUNNEL RELEASE;  Surgeon: Cindee Salt, MD;  Location: [facility];  Service: Orthopedics;  Laterality: Left;  axillary block   ELBOW ARTHROPLASTY Left 1990's   "3 spurs; ?14 staples" (12/30/2012)   KNEE ARTHROSCOPY WITH SUBCHONDROPLASTY Right 04/18/2021   Procedure: KNEE ARTHROSCOPY WITH PARTIAL MEDIAL AND LATERAL MENISCECTOMY AND DEBRIDEMENT,MICROFRACTURE medial tibial plateau;  Surgeon: Jene Every, MD;  Location: [facility];  Service: Orthopedics;  Laterality: Right;    LEFT HEART CATHETERIZATION WITH CORONARY ANGIOGRAM N/A 12/31/2012   Procedure: LEFT HEART CATHETERIZATION WITH CORONARY ANGIOGRAM;  Surgeon: Tonny Bollman, MD;  Location: [facility] CATH [facility];  Service: Cardiovascular;   Laterality: N/A;   LUMBAR DISC SURGERY  2010; 2011   "2 ruptured discs; 2 pinched nerves"   SHOULDER SURGERY Right    ULNAR NERVE TRANSPOSITION Left 01/08/2018   Procedure: DECOMPRESSION LEFT ULNAR AT ELBOW;  Surgeon: Cindee Salt, MD;  Location: [facility];  Service: Orthopedics;  Laterality: Left;  axillary block    There were no vitals filed for this visit.   Subjective Assessment - 06/27/21 0857     Subjective COVID-19 screen performed prior to patient entering clinic.  Doing very well.    Pertinent History Left CTR, left elbow, right shoulder and lumbar surgery.    How long can you walk comfortably? Just began wbat over his right LE 0n 06/01/21 using a cane.  Not walking long distances.    Patient Stated Goals Would like to avoid a total knee replacement.    Currently in Pain? Yes    Pain Location Knee    Pain Orientation Right    Pain Descriptors / Indicators Aching;Discomfort    Pain Type Chronic pain    Pain Onset More than a month ago                               [facility] Adult PT Treatment/Exercise - 06/27/21 0001       Exercises   Exercises Knee/Hip;Ankle      Knee/Hip Exercises: Aerobic  Nustep level 5 x 15 minutes.      Knee/Hip Exercises: Supine   Short Arc Quad Sets Limitations 5# x 4 minutes.      Vasopneumatic   Number Minutes Vasopneumatic  15 minutes    Vasopnuematic Location  --   Right knee.   Vasopneumatic Pressure Low      Ankle Exercises: Standing   Rocker Board 4 minutes                          PT Long Term Goals - 06/20/21 0815       PT LONG TERM GOAL #1   Title Patient will be independent with HEP and progression.    Time 4    Period Weeks    Status On-going      PT LONG TERM GOAL #2   Title Patient walk a community distance with pain not > 2-3/10.    Time 4    Period Weeks    Status On-going      PT LONG TERM GOAL #3   Title Achieve 115 of active right knee flexion without pain  increase.    Baseline AROM 111 degrees 06/20/21    Time 4    Period Weeks    Status On-going      PT LONG TERM GOAL #4   Title Patient perform a reciprocating stair gait with one railing.    Time 4    Period Weeks    Status On-going      PT LONG TERM GOAL #5   Title Walk a community distance with right knee pain > 2-3/10.    Time 4    Period Weeks    Status On-going                   Plan - 06/27/21 1133     Clinical Impression Statement Patient states he has been out walking and his knee is feeling good.  Uses straight cane currently for additional stability.    Personal Factors and Comorbidities Comorbidity 1;Other    Comorbidities Left CTR, left elbow, right shoulder and lumbar surgery.    Examination-Activity Limitations Other;Locomotion Level    Examination-Participation Restrictions Other    Stability/Clinical Decision Making Stable/Uncomplicated             Patient will benefit from skilled therapeutic intervention in order to improve the following deficits and impairments:  Abnormal gait, Decreased activity tolerance, Pain, Increased edema  Visit Diagnosis: Chronic pain of right knee  Localized edema  Stiffness of right knee, not elsewhere classified     Problem List Patient Active Problem List   Diagnosis Date Noted   Guaiac positive stools 10/24/2016   Precordial pain 12/30/2012   HTN (hypertension)    Diabetes mellitus type 2, noninsulin dependent (HCC)    Hyperlipidemia    Morbid obesity (HCC)    CKD (chronic kidney disease) stage 3, GFR 30-59 ml/min (HCC)    Anginal pain (HCC) 12/29/2012    Kayse Puccini, [place], PT 06/27/2021, 11:34 AM  [facility] [address] [place], [place], [address] Phone: [phone]   Fax:  [phone]  Name: [name] MRN: [id number] Date of Birth: 1960-12-08

## 2021-07-02 ENCOUNTER — Ambulatory Visit: Payer: Medicare Other | Admitting: Physical Therapy

## 2021-07-02 ENCOUNTER — Other Ambulatory Visit: Payer: Self-pay

## 2021-07-02 DIAGNOSIS — G8929 Other chronic pain: Secondary | ICD-10-CM

## 2021-07-02 DIAGNOSIS — M25661 Stiffness of right knee, not elsewhere classified: Secondary | ICD-10-CM

## 2021-07-02 DIAGNOSIS — M25561 Pain in right knee: Secondary | ICD-10-CM

## 2021-07-02 DIAGNOSIS — R600 Localized edema: Secondary | ICD-10-CM

## 2021-07-02 NOTE — Therapy (Signed)
[facility] [place], [place], [address] Phone: [phone]   Fax:  [phone]  Physical Therapy Treatment  Patient Details  Name: [name] MRN: [id number] Date of Birth: 02/26/1961 Referring Provider (PT): Susa Day MD   Encounter Date: 07/02/2021   PT End of Session - 07/02/21 0821     Visit Number 8    Number of Visits 12    Date for PT Re-Evaluation 07/05/21    PT Start Time 0814    PT Stop Time 0904    PT Time Calculation (min) 50 min    Activity Tolerance Patient tolerated treatment well    Behavior During Therapy [facility] for tasks assessed/performed             Past Medical History:  Diagnosis Date   Anginal pain ([place]) 12/29/2012   CKD (chronic kidney disease) stage 3, GFR 30-59 ml/min (HCC)    Diabetes mellitus type 2, noninsulin dependent ([place])    GERD (gastroesophageal reflux disease)    H/O hiatal hernia    Headache(784.0)    "not daily, not weekly, but very often" (12/30/2012)   HTN (hypertension)    Hyperlipidemia    Morbid obesity ([place])    Osteoarthritis    "hands" (12/30/2012)    Past Surgical History:  Procedure Laterality Date   CARPAL TUNNEL RELEASE Left 01/08/2018   Procedure: LEFT CARPAL TUNNEL RELEASE;  Surgeon: Daryll Brod, MD;  Location: [facility];  Service: Orthopedics;  Laterality: Left;  axillary block   ELBOW ARTHROPLASTY Left 1990's   "3 spurs; ?14 staples" (12/30/2012)   KNEE ARTHROSCOPY WITH SUBCHONDROPLASTY Right 04/18/2021   Procedure: KNEE ARTHROSCOPY WITH PARTIAL MEDIAL AND LATERAL MENISCECTOMY AND DEBRIDEMENT,MICROFRACTURE medial tibial plateau;  Surgeon: Susa Day, MD;  [facility];  Service: Orthopedics;  Laterality: Right;  60MIN   LEFT HEART CATHETERIZATION WITH CORONARY ANGIOGRAM N/A 12/31/2012   Procedure: LEFT HEART CATHETERIZATION WITH CORONARY ANGIOGRAM;  Surgeon: Sherren Mocha, MD;  Location: [facility] CATH LAB;  Service: Cardiovascular;   Laterality: N/A;   LUMBAR [place] SURGERY  2010; 2011   "2 ruptured discs; 2 pinched nerves"   SHOULDER SURGERY Right    ULNAR NERVE TRANSPOSITION Left 01/08/2018   Procedure: DECOMPRESSION LEFT ULNAR AT ELBOW;  Surgeon: Daryll Brod, MD;  Location: [facility];  Service: Orthopedics;  Laterality: Left;  axillary block    There were no vitals filed for this visit.   Subjective Assessment - 07/02/21 0819     Subjective COVID-19 screen performed prior to patient entering clinic.  Patient arrived with some posterior knee discomfort    Pertinent History Left CTR, left elbow, right shoulder and lumbar surgery.    How long can you walk comfortably? Just began wbat over his right LE 0n 06/01/21 using a cane.  Not walking long distances.    Patient Stated Goals Would like to avoid a total knee replacement.    Currently in Pain? Yes    Pain Score 2     Pain Location Knee    Pain Orientation Right;Posterior    Pain Descriptors / Indicators Discomfort;Aching    Pain Type Surgical pain    Pain Onset More than a month ago    Pain Frequency Intermittent    Aggravating Factors  prolong actvity    Pain Relieving Factors rest                [facility] PT Assessment - 07/02/21 0001  AROM   AROM Assessment Site Knee    Right/Left Knee Right    Right Knee Flexion 116                           [facility] Adult PT Treatment/Exercise - 07/02/21 0001       Knee/Hip Exercises: Aerobic   Nustep level 5 x 15 minutes.      Knee/Hip Exercises: Standing   Knee Flexion Strengthening;Right;20 reps    Knee Flexion Limitations 3#    Terminal Knee Extension Strengthening;Right;20 reps;Theraband    Theraband Level (Terminal Knee Extension) Level 3 ([place])    Rocker Board 2 minutes      Knee/Hip Exercises: Seated   Long Arc Quad Strengthening;Right;3 sets;10 reps    Long Arc Quad Weight 4 lbs.      Vasopneumatic   Number Minutes Vasopneumatic  15 minutes    Vasopnuematic  Location  Knee   Right   Vasopneumatic Pressure Low    Vasopneumatic Temperature  34      Manual Therapy   Manual Therapy Passive ROM    Passive ROM measurement only                          PT Long Term Goals - 07/02/21 5277       PT LONG TERM GOAL #1   Title Patient will be independent with HEP and progression.    Time 4    Period Weeks    Status On-going      PT LONG TERM GOAL #2   Title Patient walk a community distance with pain not > 2-3/10.    Baseline ongoing pain with walking community distance 07/02/21    Time 4    Period Weeks    Status On-going      PT LONG TERM GOAL #3   Title Achieve 115 of active right knee flexion without pain increase.    Baseline AROM 116 degrees 07/02/21    Time 4    Period Weeks    Status Achieved      PT LONG TERM GOAL #4   Title Patient perform a reciprocating stair gait with one railing.    Time 4    Period Weeks    Status On-going                   Plan - 07/02/21 0855     Clinical Impression Statement Patient tolerated treatment well today. Patient reported all the pain is in posterior knee today. Patient has improved with ROM for right kne flexion. Patient able to progress with PRE's today. No increased pain from activities. Patient limits walking due to posterior knee pain. Met ROM goal with others progressing.    Personal Factors and Comorbidities Comorbidity 1;Other    Comorbidities Left CTR, left elbow, right shoulder and lumbar surgery.    Examination-Activity Limitations Other;Locomotion Level    Examination-Participation Restrictions Other    Stability/Clinical Decision Making Stable/Uncomplicated    Rehab Potential Excellent    PT Duration 4 weeks    PT Treatment/Interventions ADLs/Self Care Home Management;Cryotherapy;Electrical Stimulation;Moist Heat;Gait training;Stair training;Functional mobility training;Therapeutic activities;Therapeutic exercise;Manual techniques;Patient/family  education;Passive range of motion    PT Next Visit Plan continue PAIN-FREE RIGHT LE THER EX.  Vasopneumatic.    Consulted and Agree with Plan of Care Patient             Patient will benefit from skilled  therapeutic intervention in order to improve the following deficits and impairments:  Abnormal gait, Decreased activity tolerance, Pain, Increased edema  Visit Diagnosis: Chronic pain of right knee  Localized edema  Stiffness of right knee, not elsewhere classified     Problem List Patient Active Problem List   Diagnosis Date Noted   Guaiac positive stools 10/24/2016   Precordial pain 12/30/2012   HTN (hypertension)    Diabetes mellitus type 2, noninsulin dependent ([place])    Hyperlipidemia    Morbid obesity ([place])    CKD (chronic kidney disease) stage 3, GFR 30-59 ml/min (HCC)    Anginal pain ([place]) 12/29/2012    Alyannah Sanks P, PTA 07/02/2021, 9:04 AM  [facility] [address] [place], [place], [address] Phone: [phone]   Fax:  [phone]  Name: [name] MRN: [id number] Date of Birth: 01-28-1961

## 2021-07-04 ENCOUNTER — Other Ambulatory Visit: Payer: Self-pay

## 2021-07-04 ENCOUNTER — Ambulatory Visit: Payer: Medicare Other | Admitting: Physical Therapy

## 2021-07-04 DIAGNOSIS — M25661 Stiffness of right knee, not elsewhere classified: Secondary | ICD-10-CM

## 2021-07-04 DIAGNOSIS — M25561 Pain in right knee: Secondary | ICD-10-CM | POA: Diagnosis not present

## 2021-07-04 DIAGNOSIS — G8929 Other chronic pain: Secondary | ICD-10-CM

## 2021-07-04 DIAGNOSIS — R600 Localized edema: Secondary | ICD-10-CM

## 2021-07-04 NOTE — Therapy (Signed)
[facility] [address] [place], [place], [address] Phone: [phone]   Fax:  [phone]  Physical Therapy Treatment  Patient Details  Name: [name] MRN: [id number] Date of Birth: September 06, 1961 Referring Provider (PT): Jene Every MD   Encounter Date: 07/04/2021   PT End of Session - 07/04/21 0847     Visit Number 9    Number of Visits 12    Date for PT Re-Evaluation 07/05/21    PT Start Time 0815    PT Stop Time 0856    PT Time Calculation (min) 41 min    Activity Tolerance Patient tolerated treatment well    Behavior During Therapy [facility] for tasks assessed/performed             Past Medical History:  Diagnosis Date   Anginal pain (HCC) 12/29/2012   CKD (chronic kidney disease) stage 3, GFR 30-59 ml/min (HCC)    Diabetes mellitus type 2, noninsulin dependent (HCC)    GERD (gastroesophageal reflux disease)    H/O hiatal hernia    Headache(784.0)    "not daily, not weekly, but very often" (12/30/2012)   HTN (hypertension)    Hyperlipidemia    Morbid obesity (HCC)    Osteoarthritis    "hands" (12/30/2012)    Past Surgical History:  Procedure Laterality Date   CARPAL TUNNEL RELEASE Left 01/08/2018   Procedure: LEFT CARPAL TUNNEL RELEASE;  Surgeon: Cindee Salt, MD;  Location: [facility];  Service: Orthopedics;  Laterality: Left;  axillary block   ELBOW ARTHROPLASTY Left 1990's   "3 spurs; ?14 staples" (12/30/2012)   KNEE ARTHROSCOPY WITH SUBCHONDROPLASTY Right 04/18/2021   Procedure: KNEE ARTHROSCOPY WITH PARTIAL MEDIAL AND LATERAL MENISCECTOMY AND DEBRIDEMENT,MICROFRACTURE medial tibial plateau;  Surgeon: Jene Every, MD;  [facility];  Service: Orthopedics;  Laterality: Right;    LEFT HEART CATHETERIZATION WITH CORONARY ANGIOGRAM N/A 12/31/2012   Procedure: LEFT HEART CATHETERIZATION WITH CORONARY ANGIOGRAM;  Surgeon: Tonny Bollman, MD;  Location: [facility] CATH LAB;  Service: Cardiovascular;   Laterality: N/A;   LUMBAR [place] SURGERY  2010; 2011   "2 ruptured discs; 2 pinched nerves"   SHOULDER SURGERY Right    ULNAR NERVE TRANSPOSITION Left 01/08/2018   Procedure: DECOMPRESSION LEFT ULNAR AT ELBOW;  Surgeon: Cindee Salt, MD;  Location: [facility];  Service: Orthopedics;  Laterality: Left;  axillary block    There were no vitals filed for this visit.   Subjective Assessment - 07/04/21 0846     Subjective COVID-19 screen performed prior to patient entering clinic.  The patient stating his pain is about a 2 today.  Reports some pain and "puffiness" in posterior knee.  He states it's been this way since suregry.    Pertinent History Left CTR, left elbow, right shoulder and lumbar surgery.    Patient Stated Goals Would like to avoid a total knee replacement.    Currently in Pain? Yes    Pain Score 2     Pain Location Knee    Pain Orientation Right;Posterior    Pain Descriptors / Indicators Discomfort;Aching    Pain Onset More than a month ago                               [facility] Adult PT Treatment/Exercise - 07/04/21 0001       Exercises   Exercises Knee/Hip      Knee/Hip Exercises: Aerobic   Nustep  Level 5 x 15 minutes.      Vasopneumatic   Number Minutes Vasopneumatic  15 minutes    Vasopnuematic Location  --   Right knee.   Vasopneumatic Pressure Medium      Manual Therapy   Manual Therapy Soft tissue mobilization    Soft tissue mobilization STW/M x  to patient's right popliteal fossa, distal hamstrings and proximal gatroc x 5 minutes.      Ankle Exercises: Standing   Rocker Board 3 minutes                          PT Long Term Goals - 07/02/21 2993       PT LONG TERM GOAL #1   Title Patient will be independent with HEP and progression.    Time 4    Period Weeks    Status On-going      PT LONG TERM GOAL #2   Title Patient walk a community distance with pain not > 2-3/10.    Baseline ongoing pain with  walking community distance 07/02/21    Time 4    Period Weeks    Status On-going      PT LONG TERM GOAL #3   Title Achieve 115 of active right knee flexion without pain increase.    Baseline AROM 116 degrees 07/02/21    Time 4    Period Weeks    Status Achieved      PT LONG TERM GOAL #4   Title Patient perform a reciprocating stair gait with one railing.    Time 4    Period Weeks    Status On-going                   Plan - 07/04/21 0849     Clinical Impression Statement The patient's pain-level remains low.  He is tender to palpation in his right popliteal fossa region and the heads of his Gastrocs.  Overall, he is progressing very well.  He tolerated STW/M very well today.    Personal Factors and Comorbidities Comorbidity 1;Other    Comorbidities Left CTR, left elbow, right shoulder and lumbar surgery.    Examination-Activity Limitations Other;Locomotion Level    Examination-Participation Restrictions Other    Rehab Potential Excellent    PT Duration 4 weeks    PT Treatment/Interventions ADLs/Self Care Home Management;Cryotherapy;Electrical Stimulation;Moist Heat;Gait training;Stair training;Functional mobility training;Therapeutic activities;Therapeutic exercise;Manual techniques;Patient/family education;Passive range of motion    PT Next Visit Plan continue PAIN-FREE RIGHT LE THER EX.  Vasopneumatic.  STW/M as needed.    Consulted and Agree with Plan of Care Patient             Patient will benefit from skilled therapeutic intervention in order to improve the following deficits and impairments:  Abnormal gait, Decreased activity tolerance, Pain, Increased edema  Visit Diagnosis: Chronic pain of right knee  Localized edema  Stiffness of right knee, not elsewhere classified     Problem List Patient Active Problem List   Diagnosis Date Noted   Guaiac positive stools 10/24/2016   Precordial pain 12/30/2012   HTN (hypertension)    Diabetes mellitus type  2, noninsulin dependent (HCC)    Hyperlipidemia    Morbid obesity (HCC)    CKD (chronic kidney disease) stage 3, GFR 30-59 ml/min (HCC)    Anginal pain (HCC) 12/29/2012    Dorian Duval, [place], PT 07/04/2021, 10:04 AM  [facility] [address] [place],  [place], [address] Phone: [phone]   Fax:  [phone]  Name: [name] MRN: [id number] Date of Birth: 04-08-1961

## 2021-07-09 ENCOUNTER — Ambulatory Visit: Payer: Medicare Other | Admitting: *Deleted

## 2021-07-09 ENCOUNTER — Other Ambulatory Visit: Payer: Self-pay

## 2021-07-09 DIAGNOSIS — M25661 Stiffness of right knee, not elsewhere classified: Secondary | ICD-10-CM

## 2021-07-09 DIAGNOSIS — M6281 Muscle weakness (generalized): Secondary | ICD-10-CM

## 2021-07-09 DIAGNOSIS — R600 Localized edema: Secondary | ICD-10-CM

## 2021-07-09 DIAGNOSIS — R262 Difficulty in walking, not elsewhere classified: Secondary | ICD-10-CM

## 2021-07-09 DIAGNOSIS — G8929 Other chronic pain: Secondary | ICD-10-CM

## 2021-07-09 DIAGNOSIS — M25561 Pain in right knee: Secondary | ICD-10-CM | POA: Diagnosis not present

## 2021-07-09 NOTE — Therapy (Signed)
[facility] [place], [place], [address] Phone: [phone]   Fax:  [phone]  Physical Therapy Treatment  Patient Details  Name: [name] MRN: [id number] Date of Birth: [birth year] Referring Provider (PT): Susa Day MD   Encounter Date: 07/09/2021   PT End of Session - 07/09/21 0907     Visit Number 10    Number of Visits 12    Date for PT Re-Evaluation 07/05/21    PT Start Time 0815    PT Stop Time 0905    PT Time Calculation (min) 50 min             Past Medical History:  Diagnosis Date   Anginal pain ([place]) 12/29/2012   CKD (chronic kidney disease) stage 3, GFR 30-59 ml/min ([place])    Diabetes mellitus type 2, noninsulin dependent ([place])    GERD (gastroesophageal reflux disease)    H/O hiatal hernia    Headache(784.0)    "not daily, not weekly, but very often" (12/30/2012)   HTN (hypertension)    Hyperlipidemia    Morbid obesity ([place])    Osteoarthritis    "hands" (12/30/2012)    Past Surgical History:  Procedure Laterality Date   CARPAL TUNNEL RELEASE Left 01/08/2018   Procedure: LEFT CARPAL TUNNEL RELEASE;  Surgeon: Daryll Brod, MD;  Location: [facility];  Service: Orthopedics;  Laterality: Left;  axillary block   ELBOW ARTHROPLASTY Left 1990's   "3 spurs; ?14 staples" (12/30/2012)   KNEE ARTHROSCOPY WITH SUBCHONDROPLASTY Right 04/18/2021   Procedure: KNEE ARTHROSCOPY WITH PARTIAL MEDIAL AND LATERAL MENISCECTOMY AND DEBRIDEMENT,MICROFRACTURE medial tibial plateau;  Surgeon: Susa Day, MD;  [facility];  Service: Orthopedics;  Laterality: Right;  60MIN   LEFT HEART CATHETERIZATION WITH CORONARY ANGIOGRAM N/A 12/31/2012   Procedure: LEFT HEART CATHETERIZATION WITH CORONARY ANGIOGRAM;  Surgeon: Sherren Mocha, MD;  Location: [facility] CATH LAB;  Service: Cardiovascular;  Laterality: N/A;   LUMBAR [place] SURGERY  2010; 2011   "2 ruptured discs; 2 pinched nerves"   SHOULDER SURGERY  Right    ULNAR NERVE TRANSPOSITION Left 01/08/2018   Procedure: DECOMPRESSION LEFT ULNAR AT ELBOW;  Surgeon: Daryll Brod, MD;  Location: [facility];  Service: Orthopedics;  Laterality: Left;  axillary block    There were no vitals filed for this visit.   Subjective Assessment - 07/09/21 0824     Subjective COVID-19 screen performed prior to patient entering clinic.  The patient stating his pain is about a 2 today.  Most of his my pain is in the back. Going to call MD office about checking for baker's cyst    Pertinent History Left CTR, left elbow, right shoulder and lumbar surgery.    How long can you walk comfortably? Just began wbat over his right LE 0n 06/01/21 using a cane.  Not walking long distances.    Patient Stated Goals Would like to avoid a total knee replacement.    Currently in Pain? Yes    Pain Score 2     Pain Location Knee    Pain Orientation Right;Posterior    Pain Descriptors / Indicators Sore;Discomfort    Pain Type Surgical pain    Pain Onset More than a month ago                               [facility] Adult PT Treatment/Exercise - 07/09/21 0001  Exercises   Exercises Knee/Hip      Knee/Hip Exercises: Aerobic   Nustep Level 5 x 15 minutes. UE at 7      Knee/Hip Exercises: Standing   Rocker Board 3 minutes      Knee/Hip Exercises: Seated   Long Arc Quad Strengthening;Right;3 sets;10 reps   5 sec holds at top   [employer] 4 lbs.      Vasopneumatic   Number Minutes Vasopneumatic  15 minutes    Vasopnuematic Location  Knee    Vasopneumatic Pressure Medium    Vasopneumatic Temperature  34      Manual Therapy   Soft tissue mobilization STW/M x  to patient's right popliteal fossa, distal hamstrings and proximal gatroc x 5 minutes.                          PT Long Term Goals - 07/02/21 9417       PT LONG TERM GOAL #1   Title Patient will be independent with HEP and progression.    Time 4     Period Weeks    Status On-going      PT LONG TERM GOAL #2   Title Patient walk a community distance with pain not > 2-3/10.    Baseline ongoing pain with walking community distance 07/02/21    Time 4    Period Weeks    Status On-going      PT LONG TERM GOAL #3   Title Achieve 115 of active right knee flexion without pain increase.    Baseline AROM 116 degrees 07/02/21    Time 4    Period Weeks    Status Achieved      PT LONG TERM GOAL #4   Title Patient perform a reciprocating stair gait with one railing.    Time 4    Period Weeks    Status On-going                   Plan - 07/09/21 0904     Clinical Impression Statement Pt arrived today doing fairly well with  RT knee , but continues to have posterior knee pain popliteal fossa region. He continues to use [facility] for gait due to intermittent WB pain. Normal Vaso response.    Personal Factors and Comorbidities Comorbidity 1;Other    Comorbidities Left CTR, left elbow, right shoulder and lumbar surgery.    Examination-Activity Limitations Other;Locomotion Level    Stability/Clinical Decision Making Stable/Uncomplicated    Rehab Potential Excellent    PT Duration 4 weeks    PT Treatment/Interventions ADLs/Self Care Home Management;Cryotherapy;Electrical Stimulation;Moist Heat;Gait training;Stair training;Functional mobility training;Therapeutic activities;Therapeutic exercise;Manual techniques;Patient/family education;Passive range of motion    PT Next Visit Plan continue PAIN-FREE RIGHT LE THER EX.  Vasopneumatic.  STW/M as needed.   Pt to call MD office about Baker's Cyst.             Patient will benefit from skilled therapeutic intervention in order to improve the following deficits and impairments:  Abnormal gait, Decreased activity tolerance, Pain, Increased edema  Visit Diagnosis: Chronic pain of right knee  Localized edema  Stiffness of right knee, not elsewhere classified  Difficulty in walking, not  elsewhere classified  Muscle weakness (generalized)     Problem List Patient Active Problem List   Diagnosis Date Noted   Guaiac positive stools 10/24/2016   Precordial pain 12/30/2012   HTN (hypertension)    Diabetes  mellitus type 2, noninsulin dependent ([place])    Hyperlipidemia    Morbid obesity (HCC)    CKD (chronic kidney disease) stage 3, GFR 30-59 ml/min (HCC)    Anginal pain ([place]) 12/29/2012    Blakleigh Straw,[name], PTA 07/09/2021, 9:30 AM  [facility] [address] [place], [place], [address] Phone: [phone]   Fax:  [phone]  Name: [name] MRN: [id number] Date of Birth: 04/26/1961   Progress Note Reporting Period 06/07/21 to 07/09/21.  See note below for Objective Data and Assessment of Progress/Goals.  Patient progressing well and has met LTG #3.  His pain is low today with a CC of pain in the region of the popliteal fossa of his right knee.    Mali Applegate MPT

## 2021-07-11 ENCOUNTER — Ambulatory Visit: Payer: Medicare Other | Admitting: Physical Therapy

## 2021-07-11 ENCOUNTER — Other Ambulatory Visit: Payer: Self-pay

## 2021-07-11 ENCOUNTER — Encounter: Payer: Self-pay | Admitting: Physical Therapy

## 2021-07-11 DIAGNOSIS — M6281 Muscle weakness (generalized): Secondary | ICD-10-CM

## 2021-07-11 DIAGNOSIS — G8929 Other chronic pain: Secondary | ICD-10-CM

## 2021-07-11 DIAGNOSIS — M25661 Stiffness of right knee, not elsewhere classified: Secondary | ICD-10-CM

## 2021-07-11 DIAGNOSIS — R262 Difficulty in walking, not elsewhere classified: Secondary | ICD-10-CM

## 2021-07-11 DIAGNOSIS — R600 Localized edema: Secondary | ICD-10-CM

## 2021-07-11 DIAGNOSIS — M25561 Pain in right knee: Secondary | ICD-10-CM | POA: Diagnosis not present

## 2021-07-11 NOTE — Therapy (Signed)
[facility] [address] [place], [place], [address] Phone: [phone]   Fax:  [phone]  Physical Therapy Treatment  Patient Details  Name: [name] MRN: [id number] Date of Birth: 1961-06-11 Referring Provider (PT): Jene Every MD   Encounter Date: 07/11/2021   PT End of Session - 07/11/21 0925     Visit Number 11    Number of Visits 12    Date for PT Re-Evaluation 07/05/21    PT Start Time 0902    PT Stop Time 0945    PT Time Calculation (min) 43 min    Equipment Utilized During Treatment Other (comment)   SPC   Activity Tolerance Patient tolerated treatment well    Behavior During Therapy [facility] for tasks assessed/performed             Past Medical History:  Diagnosis Date   Anginal pain (HCC) 12/29/2012   CKD (chronic kidney disease) stage 3, GFR 30-59 ml/min (HCC)    Diabetes mellitus type 2, noninsulin dependent (HCC)    GERD (gastroesophageal reflux disease)    H/O hiatal hernia    Headache(784.0)    "not daily, not weekly, but very often" (12/30/2012)   HTN (hypertension)    Hyperlipidemia    Morbid obesity (HCC)    Osteoarthritis    "hands" (12/30/2012)    Past Surgical History:  Procedure Laterality Date   CARPAL TUNNEL RELEASE Left 01/08/2018   Procedure: LEFT CARPAL TUNNEL RELEASE;  Surgeon: Cindee Salt, MD;  Location: [facility];  Service: Orthopedics;  Laterality: Left;  axillary block   ELBOW ARTHROPLASTY Left 1990's   "3 spurs; ?14 staples" (12/30/2012)   KNEE ARTHROSCOPY WITH SUBCHONDROPLASTY Right 04/18/2021   Procedure: KNEE ARTHROSCOPY WITH PARTIAL MEDIAL AND LATERAL MENISCECTOMY AND DEBRIDEMENT,MICROFRACTURE medial tibial plateau;  Surgeon: Jene Every, MD;  Location: [facility];  Service: Orthopedics;  Laterality: Right;    LEFT HEART CATHETERIZATION WITH CORONARY ANGIOGRAM N/A 12/31/2012   Procedure: LEFT HEART CATHETERIZATION WITH CORONARY ANGIOGRAM;  Surgeon: Tonny Bollman, MD;  Location: [facility];  Service: Cardiovascular;  Laterality: N/A;   LUMBAR DISC SURGERY  2010; 2011   "2 ruptured discs; 2 pinched nerves"   SHOULDER SURGERY Right    ULNAR NERVE TRANSPOSITION Left 01/08/2018   Procedure: DECOMPRESSION LEFT ULNAR AT ELBOW;  Surgeon: Cindee Salt, MD;  Location: [facility];  Service: Orthopedics;  Laterality: Left;  axillary block    There were no vitals filed for this visit.   Subjective Assessment - 07/11/21 0909     Subjective COVID-19 screen performed prior to patient entering clinic. Continued tightness and  throbbing in posterior knee. Worse in the mornings.    Pertinent History Left CTR, left elbow, right shoulder and lumbar surgery.    How long can you walk comfortably? Just began wbat over his right LE 0n 06/01/21 using a cane.  Not walking long distances.    Patient Stated Goals Would like to avoid a total knee replacement.    Currently in Pain? Yes    Pain Score 2     Pain Location Knee    Pain Orientation Right;Posterior    Pain Descriptors / Indicators Throbbing    Pain Type Surgical pain    Pain Onset More than a month ago    Pain Frequency Intermittent                [facility] PT Assessment - 07/11/21 0001  Assessment   Medical Diagnosis Right knee arthroscopic surgery    Referring Provider (PT) Jene Every MD    Onset Date/Surgical Date 04/18/21    Next MD Visit None      Precautions   Precaution Comments PAIN-FREE RIGHT LE THER EX.                           [facility] Adult PT Treatment/Exercise - 07/11/21 0001       Knee/Hip Exercises: Aerobic   Nustep Level 5 x 15 minutes. UE at 7      Knee/Hip Exercises: Standing   Forward Lunges Right;2 sets;10 reps    Forward Step Up Right;15 reps;Hand Hold: 2;Step Height: 8"    Rocker Board 3 minutes      Knee/Hip Exercises: Supine   Short Arc Quad Sets Strengthening;Right;2 sets;10 reps    Short Arc Quad Sets Limitations 3#     Straight Leg Raises Strengthening;Right;2 sets;10 reps      Modalities   Modalities Vasopneumatic      Vasopneumatic   Number Minutes Vasopneumatic  15 minutes    Vasopnuematic Location  Knee    Vasopneumatic Pressure Low    Vasopneumatic Temperature  60/pain and edema                          PT Long Term Goals - 07/02/21 1941       PT LONG TERM GOAL #1   Title Patient will be independent with HEP and progression.    Time 4    Period Weeks    Status On-going      PT LONG TERM GOAL #2   Title Patient walk a community distance with pain not > 2-3/10.    Baseline ongoing pain with walking community distance 07/02/21    Time 4    Period Weeks    Status On-going      PT LONG TERM GOAL #3   Title Achieve 115 of active right knee flexion without pain increase.    Baseline AROM 116 degrees 07/02/21    Time 4    Period Weeks    Status Achieved      PT LONG TERM GOAL #4   Title Patient perform a reciprocating stair gait with one railing.    Time 4    Period Weeks    Status On-going                   Plan - 07/11/21 7408     Clinical Impression Statement Patient continues to present with only limitations being the discomfort from possible fluid in popliteal region of posterior knee. Patient indicates that stiffness and discomfort in worse initially in the mornings upon waking. Uses SPC for ambulation. Denies any knee buckling during ambulation at this time. Denies any anterior knee pain or ADL difficulties. Able to tolerate light quad strengthening exercises well and can still feel a notable calf stretch on rockerboard. Normal vasopneumatic response noted following removal of the modality.    Personal Factors and Comorbidities Comorbidity 1;Other    Comorbidities Left CTR, left elbow, right shoulder and lumbar surgery.    Examination-Activity Limitations Other;Locomotion Level    Examination-Participation Restrictions Other    Stability/Clinical  Decision Making Stable/Uncomplicated    Rehab Potential Excellent    PT Duration 4 weeks    PT Treatment/Interventions ADLs/Self Care Home Management;Cryotherapy;Electrical Stimulation;Moist Heat;Gait training;Stair training;Functional mobility training;Therapeutic activities;Therapeutic exercise;Manual  techniques;Patient/family education;Passive range of motion    PT Next Visit Plan continue PAIN-FREE RIGHT LE THER EX.  Vasopneumatic.  STW/M as needed.   Pt to call MD office about Baker's Cyst.    Consulted and Agree with Plan of Care Patient             Patient will benefit from skilled therapeutic intervention in order to improve the following deficits and impairments:  Abnormal gait, Decreased activity tolerance, Pain, Increased edema  Visit Diagnosis: Chronic pain of right knee  Localized edema  Stiffness of right knee, not elsewhere classified  Difficulty in walking, not elsewhere classified  Muscle weakness (generalized)     Problem List Patient Active Problem List   Diagnosis Date Noted   Guaiac positive stools 10/24/2016   Precordial pain 12/30/2012   HTN (hypertension)    Diabetes mellitus type 2, noninsulin dependent (HCC)    Hyperlipidemia    Morbid obesity (HCC)    CKD (chronic kidney disease) stage 3, GFR 30-59 ml/min (HCC)    Anginal pain (HCC) 12/29/2012    Marvell Fuller, PTA 07/11/2021, 9:50 AM  [facility] [address] [place], [place], [address] Phone: [phone]   Fax:  [phone]  Name: TIMOTH SCHARA MRN: [id number] Date of Birth: 1961-06-15

## 2021-07-16 ENCOUNTER — Encounter: Payer: Self-pay | Admitting: Physical Therapy

## 2021-07-16 ENCOUNTER — Ambulatory Visit: Payer: Medicare Other | Attending: Specialist | Admitting: Physical Therapy

## 2021-07-16 ENCOUNTER — Other Ambulatory Visit: Payer: Self-pay

## 2021-07-16 DIAGNOSIS — M25561 Pain in right knee: Secondary | ICD-10-CM | POA: Insufficient documentation

## 2021-07-16 DIAGNOSIS — G8929 Other chronic pain: Secondary | ICD-10-CM | POA: Insufficient documentation

## 2021-07-16 DIAGNOSIS — M25661 Stiffness of right knee, not elsewhere classified: Secondary | ICD-10-CM | POA: Insufficient documentation

## 2021-07-16 DIAGNOSIS — R600 Localized edema: Secondary | ICD-10-CM | POA: Diagnosis present

## 2021-07-16 DIAGNOSIS — R262 Difficulty in walking, not elsewhere classified: Secondary | ICD-10-CM | POA: Diagnosis present

## 2021-07-16 DIAGNOSIS — M6281 Muscle weakness (generalized): Secondary | ICD-10-CM | POA: Insufficient documentation

## 2021-07-16 NOTE — Therapy (Addendum)
[facility] [place], [place], [address] Phone: [phone]   Fax:  [phone]  Physical Therapy Treatment  Patient Details  Name: [name] MRN: [id number] Date of Birth: 1960-10-29 Referring Provider (PT): Susa Day MD   Encounter Date: 07/16/2021   PT End of Session - 07/16/21 0912     Visit Number 12    Number of Visits 12    Date for PT Re-Evaluation 07/05/21    PT Start Time 0900    PT Stop Time 6962    PT Time Calculation (min) 53 min    Equipment Utilized During Treatment Other (comment)   [place]   Activity Tolerance Patient tolerated treatment well    Behavior During Therapy [facility] for tasks assessed/performed             Past Medical History:  Diagnosis Date   Anginal pain ([place]) 12/29/2012   CKD (chronic kidney disease) stage 3, GFR 30-59 ml/min ([place])    Diabetes mellitus type 2, noninsulin dependent ([place])    GERD (gastroesophageal reflux disease)    H/O hiatal hernia    Headache(784.0)    "not daily, not weekly, but very often" (12/30/2012)   HTN (hypertension)    Hyperlipidemia    Morbid obesity ([place])    Osteoarthritis    "hands" (12/30/2012)    Past Surgical History:  Procedure Laterality Date   CARPAL TUNNEL RELEASE Left 01/08/2018   Procedure: LEFT CARPAL TUNNEL RELEASE;  Surgeon: Daryll Brod, MD;  Location: [place];  Service: Orthopedics;  Laterality: Left;  axillary block   ELBOW ARTHROPLASTY Left 1990's   "3 spurs; ?14 staples" (12/30/2012)   KNEE ARTHROSCOPY WITH SUBCHONDROPLASTY Right 04/18/2021   Procedure: KNEE ARTHROSCOPY WITH PARTIAL MEDIAL AND LATERAL MENISCECTOMY AND DEBRIDEMENT,MICROFRACTURE medial tibial plateau;  Surgeon: Susa Day, MD;  Location: [facility];  Service: Orthopedics;  Laterality: Right;  60MIN   LEFT HEART CATHETERIZATION WITH CORONARY ANGIOGRAM N/A 12/31/2012   Procedure: LEFT HEART CATHETERIZATION WITH CORONARY ANGIOGRAM;  Surgeon: Sherren Mocha, MD;  Location[facility];  Service: Cardiovascular;  Laterality: N/A;   LUMBAR [place] SURGERY  2010; 2011   "2 ruptured discs; 2 pinched nerves"   SHOULDER SURGERY Right    ULNAR NERVE TRANSPOSITION Left 01/08/2018   Procedure: DECOMPRESSION LEFT ULNAR AT ELBOW;  Surgeon: Daryll Brod, MD;  Location: [place];  Service: Orthopedics;  Laterality: Left;  axillary block    There were no vitals filed for this visit.   Subjective Assessment - 07/16/21 0911     Subjective COVID-19 screen performed prior to patient entering clinic. States that he has more swelling to posterior knee and wasn't up on it a lot over the weekend.    Pertinent History Left CTR, left elbow, right shoulder and lumbar surgery.    How long can you walk comfortably? Just began wbat over his right LE 0n 06/01/21 using a cane.  Not walking long distances.    Patient Stated Goals Would like to avoid a total knee replacement.    Currently in Pain? Yes    Pain Score 5     Pain Location Knee    Pain Orientation Posterior    Pain Descriptors / Indicators Sore    Pain Type Surgical pain    Pain Onset More than a month ago    Pain Frequency Intermittent                [facility] PT Assessment -  07/16/21 0001       Assessment   Medical Diagnosis Right knee arthroscopic surgery    Referring Provider (PT) Susa Day MD    Onset Date/Surgical Date 04/18/21    Next MD Visit None      Precautions   Precaution Comments PAIN-FREE RIGHT LE THER EX.      Observation/Other Assessments   Focus on Therapeutic Outcomes (FOTO)  41% limitation 07/16/2021                           [facility] Adult PT Treatment/Exercise - 07/16/21 0001       Ambulation/Gait   Stairs Yes    Stairs Assistance 6: Modified independent (Device/Increase time)    Stair Management Technique One rail Left;Step to pattern;Forwards    Number of Stairs 4    Height of Stairs 6.5      Knee/Hip Exercises: Aerobic    Nustep Level 5 x 15 minutes. UE at 7      Knee/Hip Exercises: Standing   Hip Abduction Stengthening;Right;2 sets;10 reps;Knee straight;Limitations    Abduction Limitations green theraband    Hip Extension Stengthening;Right;2 sets;10 reps;Knee straight;Limitations    Extension Limitations green theraband    Rocker Board 3 minutes      Knee/Hip Exercises: Seated   Long Arc Quad Strengthening;Right;2 sets;10 reps    Long Arc Quad Weight 4 lbs.      Knee/Hip Exercises: Supine   Straight Leg Raises Strengthening;Right;2 sets;10 reps      Modalities   Modalities Vasopneumatic      Vasopneumatic   Number Minutes Vasopneumatic  15 minutes    Vasopnuematic Location  Knee    Vasopneumatic Pressure Medium    Vasopneumatic Temperature  34/pain and edema                          PT Long Term Goals - 07/16/21 1016       PT LONG TERM GOAL #1   Title Patient will be independent with HEP and progression.    Time 4    Period Weeks    Status Unable to assess      PT LONG TERM GOAL #2   Title Patient walk a community distance with pain not > 2-3/10.    Baseline ongoing pain with walking community distance 07/02/21    Time 4    Period Weeks    Status Achieved      PT LONG TERM GOAL #3   Title Achieve 115 of active right knee flexion without pain increase.    Baseline AROM 116 degrees 07/02/21    Time 4    Period Weeks    Status Achieved      PT LONG TERM GOAL #4   Title Patient perform a reciprocating stair gait with one railing.    Time 4    Period Weeks    Status Not Met                   Plan - 07/16/21 1012     Clinical Impression Statement Patient presented in clinic with reports of greater R knee soreness related to posterior knee swelling and discomfort. Patient able to walk his "normal" distance within his apartment and apartment complex with minimal pain but has not walked a community distance within a store as he uses a motorized cart. Patient  is not comfortable for reciprical gait pattern on stairs and  ascends and descends nonreciprically. Patient reports a popping sound of R knee but denies any pain. Normal vasopneumatic response noted following removal of the modality. Uses SPC for ambulation and without SPC notable antalgic deviation.    Personal Factors and Comorbidities Comorbidity 1;Other    Comorbidities Left CTR, left elbow, right shoulder and lumbar surgery.    Examination-Activity Limitations Other;Locomotion Level    Examination-Participation Restrictions Other    Stability/Clinical Decision Making Stable/Uncomplicated    Rehab Potential Excellent    PT Duration 4 weeks    PT Treatment/Interventions ADLs/Self Care Home Management;Cryotherapy;Electrical Stimulation;Moist Heat;Gait training;Stair training;Functional mobility training;Therapeutic activities;Therapeutic exercise;Manual techniques;Patient/family education;Passive range of motion    PT Next Visit Plan D/C    Consulted and Agree with Plan of Care Patient             Patient will benefit from skilled therapeutic intervention in order to improve the following deficits and impairments:  Abnormal gait, Decreased activity tolerance, Pain, Increased edema  Visit Diagnosis: Chronic pain of right knee  Localized edema  Stiffness of right knee, not elsewhere classified  Difficulty in walking, not elsewhere classified  Muscle weakness (generalized)     Problem List Patient Active Problem List   Diagnosis Date Noted   Guaiac positive stools 10/24/2016   Precordial pain 12/30/2012   HTN (hypertension)    Diabetes mellitus type 2, noninsulin dependent ([place])    Hyperlipidemia    Morbid obesity ([place])    CKD (chronic kidney disease) stage 3, GFR 30-59 ml/min (HCC)    Anginal pain ([place]) 12/29/2012    Standley Brooking, PTA 07/16/2021, 10:18 AM  [facility] [address] [place], [place], [address] Phone:  [phone]   Fax:  [phone]  Name: [name] MRN: [id number] Date of Birth: November 27, 1960   PHYSICAL THERAPY DISCHARGE SUMMARY  Visits from Start of Care: 12.  Current functional level related to goals / functional outcomes: See above.   Remaining deficits: Please see goal section.   Education / Equipment: HEP.   Patient agrees to discharge. Patient goals were partially met. Patient is being discharged due to being pleased with the current functional level.    Mali Applegate MPT

## 2021-09-01 ENCOUNTER — Other Ambulatory Visit: Payer: Self-pay

## 2021-09-01 ENCOUNTER — Emergency Department (HOSPITAL_COMMUNITY)
Admission: EM | Admit: 2021-09-01 | Discharge: 2021-09-01 | Disposition: A | Discharge status: Home / Self Care | Payer: Medicare Other | Attending: Emergency Medicine | Admitting: Emergency Medicine

## 2021-09-01 ENCOUNTER — Encounter (HOSPITAL_COMMUNITY): Payer: Self-pay | Admitting: Emergency Medicine

## 2021-09-01 ENCOUNTER — Emergency Department (HOSPITAL_COMMUNITY): Payer: Medicare Other

## 2021-09-01 DIAGNOSIS — N183 Chronic kidney disease, stage 3 unspecified: Secondary | ICD-10-CM | POA: Diagnosis not present

## 2021-09-01 DIAGNOSIS — R1011 Right upper quadrant pain: Secondary | ICD-10-CM | POA: Diagnosis not present

## 2021-09-01 DIAGNOSIS — E1122 Type 2 diabetes mellitus with diabetic chronic kidney disease: Secondary | ICD-10-CM | POA: Diagnosis not present

## 2021-09-01 DIAGNOSIS — Z7982 Long term (current) use of aspirin: Secondary | ICD-10-CM | POA: Insufficient documentation

## 2021-09-01 DIAGNOSIS — K219 Gastro-esophageal reflux disease without esophagitis: Secondary | ICD-10-CM | POA: Diagnosis not present

## 2021-09-01 DIAGNOSIS — Z79899 Other long term (current) drug therapy: Secondary | ICD-10-CM | POA: Diagnosis not present

## 2021-09-01 DIAGNOSIS — R079 Chest pain, unspecified: Secondary | ICD-10-CM

## 2021-09-01 DIAGNOSIS — I129 Hypertensive chronic kidney disease with stage 1 through stage 4 chronic kidney disease, or unspecified chronic kidney disease: Secondary | ICD-10-CM | POA: Diagnosis not present

## 2021-09-01 DIAGNOSIS — Z87891 Personal history of nicotine dependence: Secondary | ICD-10-CM | POA: Diagnosis not present

## 2021-09-01 DIAGNOSIS — R0789 Other chest pain: Secondary | ICD-10-CM | POA: Insufficient documentation

## 2021-09-01 LAB — CBC
HCT: 45.2 % (ref 39.0–52.0)
Hemoglobin: 14.9 g/dL (ref 13.0–17.0)
MCH: 30.9 pg (ref 26.0–34.0)
MCHC: 33 g/dL (ref 30.0–36.0)
MCV: 93.8 fL (ref 80.0–100.0)
Platelets: 241 10*3/uL (ref 150–400)
RBC: 4.82 MIL/uL (ref 4.22–5.81)
RDW: 13.3 % (ref 11.5–15.5)
WBC: 9.8 10*3/uL (ref 4.0–10.5)
nRBC: 0 % (ref 0.0–0.2)

## 2021-09-01 LAB — COMPREHENSIVE METABOLIC PANEL
ALT: 22 U/L (ref 0–44)
AST: 21 U/L (ref 15–41)
Albumin: 4 g/dL (ref 3.5–5.0)
Alkaline Phosphatase: 115 U/L (ref 38–126)
Anion gap: 9 (ref 5–15)
BUN: 27 mg/dL — ABNORMAL HIGH (ref 8–23)
CO2: 29 mmol/L (ref 22–32)
Calcium: 9.6 mg/dL (ref 8.9–10.3)
Chloride: 98 mmol/L (ref 98–111)
Creatinine, Ser: 1.4 mg/dL — ABNORMAL HIGH (ref 0.61–1.24)
GFR, Estimated: 57 mL/min — ABNORMAL LOW (ref >60)
Glucose, Bld: 103 mg/dL — ABNORMAL HIGH (ref 70–99)
Potassium: 4.5 mmol/L (ref 3.5–5.1)
Sodium: 136 mmol/L (ref 135–145)
Total Bilirubin: 0.6 mg/dL (ref 0.3–1.2)
Total Protein: 6.9 g/dL (ref 6.5–8.1)

## 2021-09-01 LAB — LIPASE, BLOOD: Lipase: 59 U/L — ABNORMAL HIGH (ref 11–51)

## 2021-09-01 LAB — TROPONIN I (HIGH SENSITIVITY)
Troponin I (High Sensitivity): 4 ng/L (ref <18)
Troponin I (High Sensitivity): 3 ng/L (ref <18)

## 2021-09-01 NOTE — ED Triage Notes (Signed)
Patient brought in via EMS from Urgent Care. Patient c/o midsternal chest pain that radiates under right axillary x2 weeks and progressively got worse today. Denies any shortness of breath, nausea, vomiting, dizziness, or weakness. Denies cardiac hx. Per patient pain the same despite movement or palpitation. 1 SL nitro and 2 baby aspirin in route to hospital (administered by paramedic). Patient took 2 baby aspirin this morning before going to Urgent Care.

## 2021-09-01 NOTE — Discharge Instructions (Addendum)
You were seen in the emergency department for some right-sided chest pain.  You had lab work chest x-ray and an EKG that did not show any evidence of heart damage.  This may be your acid reflux and you can try Maalox between meals and at bedtime.  We tried to set you up for an outpatient ultrasound of your gallbladder to make sure that that is not a cause of your symptoms.  Please follow-up with your primary care doctor.  Return to the emergency department for any worsening or concerning symptoms

## 2021-09-01 NOTE — ED Provider Notes (Signed)
[facility] EMERGENCY DEPARTMENT Provider Note   CSN: [id number] Arrival date & time: 09/01/21  1139     History Chief Complaint  Patient presents with   Chest Pain    [name] is a 60 y.o. male.  He is here by ambulance with complaint of right-sided chest pain going from his mid axillary line to his sternum that has been going on for 2 weeks although worsened over the last couple of days.  Worse with bending and twisting.  EMS gave him aspirin and a nitroglycerin without any improvement.  He denies any trauma.  No shortness of breath nausea vomiting diarrhea dizziness diaphoresis.  The history is provided by the patient.  Chest Pain Pain location:  R chest and R lateral chest Pain quality: aching   Pain radiates to:  Does not radiate Pain severity:  Moderate Onset quality:  Gradual Duration:  2 weeks Timing:  Intermittent Progression:  Worsening Chronicity:  New Context: movement   Relieved by:  None tried Worsened by:  Certain positions and movement Ineffective treatments:  None tried Associated symptoms: abdominal pain   Associated symptoms: no back pain, no cough, no diaphoresis, no dizziness, no fever, no nausea, no shortness of breath and no vomiting   Risk factors: high cholesterol and hypertension   Risk factors: no smoking       Past Medical History:  Diagnosis Date   Anginal pain (HCC) 12/29/2012   CKD (chronic kidney disease) stage 3, GFR 30-59 ml/min (HCC)    Diabetes mellitus type 2, noninsulin dependent (HCC)    GERD (gastroesophageal reflux disease)    H/O hiatal hernia    Headache(784.0)    "not daily, not weekly, but very often" (12/30/2012)   HTN (hypertension)    Hyperlipidemia    Morbid obesity (HCC)    Osteoarthritis    "hands" (12/30/2012)    Patient Active Problem List   Diagnosis Date Noted   Guaiac positive stools 10/24/2016   Precordial pain 12/30/2012   HTN (hypertension)    Diabetes mellitus type 2, noninsulin dependent  (HCC)    Hyperlipidemia    Morbid obesity (HCC)    CKD (chronic kidney disease) stage 3, GFR 30-59 ml/min (HCC)    Anginal pain (HCC) 12/29/2012    Past Surgical History:  Procedure Laterality Date   CARPAL TUNNEL RELEASE Left 01/08/2018   Procedure: LEFT CARPAL TUNNEL RELEASE;  Surgeon: Cindee Salt, MD;  Location: [facility];  Service: Orthopedics;  Laterality: Left;  axillary block   ELBOW ARTHROPLASTY Left 1990's   "3 spurs; ?14 staples" (12/30/2012)   KNEE ARTHROSCOPY WITH SUBCHONDROPLASTY Right 04/18/2021   Procedure: KNEE ARTHROSCOPY WITH PARTIAL MEDIAL AND LATERAL MENISCECTOMY AND DEBRIDEMENT,MICROFRACTURE medial tibial plateau;  Surgeon: Jene Every, MD;  Location: [facility];  Service: Orthopedics;  Laterality: Right;    LEFT HEART CATHETERIZATION WITH CORONARY ANGIOGRAM N/A 12/31/2012   Procedure: LEFT HEART CATHETERIZATION WITH CORONARY ANGIOGRAM;  Surgeon: Tonny Bollman, MD;  Location: [facility] CATH LAB;  Service: Cardiovascular;  Laterality: N/A;   LUMBAR DISC SURGERY  2010; 2011   "2 ruptured discs; 2 pinched nerves"   SHOULDER SURGERY Right    ULNAR NERVE TRANSPOSITION Left 01/08/2018   Procedure: DECOMPRESSION LEFT ULNAR AT ELBOW;  Surgeon: Cindee Salt, MD;  Location: [facility];  Service: Orthopedics;  Laterality: Left;  axillary block       History reviewed. No pertinent family history.  Social History   Tobacco Use   Smoking status:  Former    Packs/day: 1.50    Years: 8.00    Pack years: 12.00    Types: Cigarettes    Quit date: 10/14/1982    Years since quitting: 38.9   Smokeless tobacco: Former    Types: Snuff    Quit date: 10/14/1982   Tobacco comments:    12/30/2012 "I was about 60yr old when I quit smoking cigarettes & chewing snuff"  Vaping Use   Vaping Use: Never used  Substance Use Topics   Alcohol use: No   Drug use: No    Home Medications Prior to Admission medications   Medication Sig Start Date End Date Taking?  Authorizing Provider  acetaminophen (TYLENOL) 500 MG tablet Take 1,000 mg by mouth every 6 (six) hours as needed for mild pain.    [provider]  aspirin EC 81 MG tablet Take 1 tablet (81 mg total) by mouth daily. 04/19/21   Jene Every, MD  diphenhydrAMINE (BENADRYL) 25 MG tablet Take 25 mg by mouth daily.    [provider]  docusate sodium (COLACE) 100 MG capsule Take 1 capsule (100 mg total) by mouth 2 (two) times daily. 04/18/21   Jene Every, MD  famotidine (PEPCID) 20 MG tablet Take 20 mg by mouth daily. 08/17/20   [provider]  furosemide (LASIX) 40 MG tablet Take 40 mg by mouth daily as needed for fluid. 01/20/18   [provider]  gabapentin (NEURONTIN) 100 MG capsule Take 100 mg by mouth daily. 03/01/20   [provider]  JARDIANCE 25 MG TABS tablet Take 25 mg by mouth daily. 03/14/21   [provider]  meloxicam (MOBIC) 15 MG tablet Take 15 mg by mouth daily.    [provider]  methocarbamol (ROBAXIN) 500 MG tablet Take 500 mg by mouth daily as needed for muscle spasms. 02/07/21   [provider]  Multiple Vitamin (MULTIVITAMIN WITH MINERALS) TABS tablet Take 1 tablet by mouth daily.    [provider]  olmesartan-hydrochlorothiazide (BENICAR HCT) 40-25 MG per tablet Take 1 tablet by mouth daily.    [provider]  omeprazole (PRILOSEC) 40 MG capsule Take 40 mg by mouth daily.    [provider]  oxyCODONE (OXY IR/ROXICODONE) 5 MG immediate release tablet Take 1-2 tablets (5-10 mg total) by mouth every 4 (four) hours as needed for severe pain. 04/18/21   Jene Every, MD  OZEMPIC, 2 MG/DOSE, 8 MG/3ML SOPN Inject 2 mg into the skin every Friday. 04/10/21   [provider]  rosuvastatin (CRESTOR) 20 MG tablet Take 20 mg by mouth daily.    [provider]  TOUJEO MAX SOLOSTAR 300 UNIT/ML Solostar Pen Inject 80 Units into the skin every morning. 02/22/21   [provider]  vitamin C (ASCORBIC ACID) 500 MG tablet Take 500 mg by mouth daily.    [provider]  Vitamin D, Ergocalciferol, (DRISDOL) 1.25 MG (50000 UNIT) CAPS capsule Take 50,000 Units by mouth every Monday.    [provider]    Allergies    Quinolones, Tequin [gatifloxacin], and Metformin and related  Review of Systems   Review of Systems  Constitutional:  Negative for diaphoresis and fever.  HENT:  Negative for sore throat.   Eyes:  Negative for visual disturbance.  Respiratory:  Negative for cough and shortness of breath.   Cardiovascular:  Positive for chest pain.  Gastrointestinal:  Positive for abdominal pain. Negative for nausea and vomiting.  Genitourinary:  Negative for  dysuria.  Musculoskeletal:  Negative for back pain.  Skin:  Negative for rash.  Neurological:  Negative for dizziness.   Physical Exam Updated Vital Signs BP 128/74   Pulse 62   Temp 97.7 F (36.5 C) (Oral)   Resp 11   Ht 6\' 3"  (1.905 m)   Wt (!) 156 kg   SpO2 99%   BMI 43.00 kg/m   Physical Exam Vitals and nursing note reviewed.  Constitutional:      General: He is not in acute distress.    Appearance: Normal appearance. He is well-developed. He is obese.  HENT:     Head: Normocephalic and atraumatic.  Eyes:     Conjunctiva/sclera: Conjunctivae normal.  Cardiovascular:     Rate and Rhythm: Normal rate and regular rhythm.     Heart sounds: No murmur heard. Pulmonary:     Effort: Pulmonary effort is normal. No respiratory distress.     Breath sounds: Normal breath sounds.  Abdominal:     Palpations: Abdomen is soft.     Tenderness: There is abdominal tenderness in the right upper quadrant. There is no guarding or rebound.  Musculoskeletal:        General: No swelling or deformity.     Cervical back: Neck supple.  Skin:    General: Skin is warm and dry.     Capillary Refill: Capillary refill takes less than 2 seconds.  Neurological:     Mental Status: He is  alert.  Psychiatric:        Mood and Affect: Mood normal.    ED Results / Procedures / Treatments   Labs (all labs ordered are listed, but only abnormal results are displayed) Labs Reviewed  COMPREHENSIVE METABOLIC PANEL - Abnormal; Notable for the following components:      Result Value   Glucose, Bld 103 (*)    BUN 27 (*)    Creatinine, Ser 1.40 (*)    GFR, Estimated 57 (*)    All other components within normal limits  LIPASE, BLOOD - Abnormal; Notable for the following components:   Lipase 59 (*)    All other components within normal limits  CBC  TROPONIN I (HIGH SENSITIVITY)  TROPONIN I (HIGH SENSITIVITY)    EKG EKG Interpretation  Date/Time:  Saturday September 01 2021 12:01:29 EST Ventricular Rate:  61 PR Interval:  192 QRS Duration: 101 QT Interval:  395 QTC Calculation: 398 R Axis:   24 Text Interpretation: Sinus rhythm Minimal ST elevation, inferior leads No significant change since prior 6/22 Confirmed by Aletta Edouard [phone]) on 09/01/2021 12:10:23 PM  Radiology DG Chest 2 View  Result Date: 09/01/2021 CLINICAL DATA:  chest pain EXAM: CHEST - 2 VIEW COMPARISON:  January 2018 FINDINGS: The cardiomediastinal silhouette is within normal limits. No pleural effusion. No pneumothorax. No mass or consolidation. No acute osseous abnormality. IMPRESSION: No acute findings in the chest. Electronically Signed   By: Albin Felling M.D.   On: 09/01/2021 13:38    Procedures Procedures   Medications Ordered in ED Medications - No data to display  ED Course  I have reviewed the triage vital signs and the nursing notes.  Pertinent labs & imaging results that were available during my care of the patient were reviewed by me and considered in my medical decision making (see chart for details).  Clinical Course as of 09/01/21 1848  Sat Sep 01, 2021  1455 I put the patient in for an ultrasound but apparently they are gone after  2 PM.  LFTs are normal so do not feel needs  emergent ultrasound at this time.  Patient is hungry so we will feed. [MB]    Clinical Course User Index [MB] Hayden Rasmussen, MD   MDM Rules/Calculators/A&P                          This patient complains of right sided chest pain; this involves an extensive number of treatment Options and is a complaint that carries with it a high risk of complications and Morbidity. The differential includes pneumonia, pneumothorax, cholelithiasis, cholecystitis, musculoskeletal, pleurisy  I ordered, reviewed and interpreted labs, which included CBC with normal white count normal hemoglobin, chemistries normal other than elevated BUN and creatinine stable from priors, normal LFTs, lipase mildly elevated, troponins flat  I ordered imaging studies which included chest x-ray and I independently    visualized and interpreted imaging which showed no acute findings Additional history obtained from EMS Previous records obtained and reviewed in epic no recent admissions  After the interventions stated above, I reevaluated the patient and found patient's pain to be improved here he is tolerating p.o.  Has been ambulatory without any difficulty.  Unfortunately unable to get a right upper quadrant ultrasound at this time.  No evidence of acute cholecystitis.  Patient agreeable to plan for outpatient ultrasound.  Recommended close follow-up with PCP.  Return instructions discussed   Final Clinical Impression(s) / ED Diagnoses Final diagnoses:  RUQ abdominal pain  Nonspecific chest pain    Rx / DC Orders ED Discharge Orders          Ordered    US Abdomen Limited RUQ (LIVER/GB)        09/01/21 1500             Hayden Rasmussen, MD 09/01/21 1851

## 2021-09-11 ENCOUNTER — Encounter (INDEPENDENT_AMBULATORY_CARE_PROVIDER_SITE_OTHER): Payer: Self-pay | Admitting: *Deleted

## 2021-10-12 ENCOUNTER — Emergency Department (HOSPITAL_COMMUNITY): Payer: Medicare Other

## 2021-10-12 ENCOUNTER — Encounter (HOSPITAL_COMMUNITY): Payer: Self-pay | Admitting: *Deleted

## 2021-10-12 ENCOUNTER — Emergency Department (HOSPITAL_COMMUNITY)
Admission: EM | Admit: 2021-10-12 | Discharge: 2021-10-12 | Disposition: A | Discharge status: Home / Self Care | Payer: Medicare Other | Attending: Emergency Medicine | Admitting: Emergency Medicine

## 2021-10-12 ENCOUNTER — Other Ambulatory Visit: Payer: Self-pay

## 2021-10-12 DIAGNOSIS — E119 Type 2 diabetes mellitus without complications: Secondary | ICD-10-CM | POA: Diagnosis not present

## 2021-10-12 DIAGNOSIS — I129 Hypertensive chronic kidney disease with stage 1 through stage 4 chronic kidney disease, or unspecified chronic kidney disease: Secondary | ICD-10-CM | POA: Insufficient documentation

## 2021-10-12 DIAGNOSIS — N183 Chronic kidney disease, stage 3 unspecified: Secondary | ICD-10-CM | POA: Insufficient documentation

## 2021-10-12 DIAGNOSIS — R1031 Right lower quadrant pain: Secondary | ICD-10-CM | POA: Insufficient documentation

## 2021-10-12 DIAGNOSIS — Z7982 Long term (current) use of aspirin: Secondary | ICD-10-CM | POA: Diagnosis not present

## 2021-10-12 DIAGNOSIS — Z87891 Personal history of nicotine dependence: Secondary | ICD-10-CM | POA: Diagnosis not present

## 2021-10-12 LAB — CBC WITH DIFFERENTIAL/PLATELET
Abs Immature Granulocytes: 0.08 10*3/uL — ABNORMAL HIGH (ref 0.00–0.07)
Basophils Absolute: 0.1 10*3/uL (ref 0.0–0.1)
Basophils Relative: 1 %
Eosinophils Absolute: 0.2 10*3/uL (ref 0.0–0.5)
Eosinophils Relative: 2 %
HCT: 44.9 % (ref 39.0–52.0)
Hemoglobin: 15.1 g/dL (ref 13.0–17.0)
Immature Granulocytes: 1 %
Lymphocytes Relative: 17 %
Lymphs Abs: 1.8 10*3/uL (ref 0.7–4.0)
MCH: 31.1 pg (ref 26.0–34.0)
MCHC: 33.6 g/dL (ref 30.0–36.0)
MCV: 92.4 fL (ref 80.0–100.0)
Monocytes Absolute: 0.8 10*3/uL (ref 0.1–1.0)
Monocytes Relative: 7 %
Neutro Abs: 7.9 10*3/uL — ABNORMAL HIGH (ref 1.7–7.7)
Neutrophils Relative %: 72 %
Platelets: 253 10*3/uL (ref 150–400)
RBC: 4.86 MIL/uL (ref 4.22–5.81)
RDW: 13.6 % (ref 11.5–15.5)
WBC: 10.9 10*3/uL — ABNORMAL HIGH (ref 4.0–10.5)
nRBC: 0 % (ref 0.0–0.2)

## 2021-10-12 LAB — COMPREHENSIVE METABOLIC PANEL
ALT: 21 U/L (ref 0–44)
AST: 19 U/L (ref 15–41)
Albumin: 4 g/dL (ref 3.5–5.0)
Alkaline Phosphatase: 108 U/L (ref 38–126)
Anion gap: 9 (ref 5–15)
BUN: 32 mg/dL — ABNORMAL HIGH (ref 8–23)
CO2: 25 mmol/L (ref 22–32)
Calcium: 9 mg/dL (ref 8.9–10.3)
Chloride: 103 mmol/L (ref 98–111)
Creatinine, Ser: 1.48 mg/dL — ABNORMAL HIGH (ref 0.61–1.24)
GFR, Estimated: 53 mL/min — ABNORMAL LOW (ref >60)
Glucose, Bld: 134 mg/dL — ABNORMAL HIGH (ref 70–99)
Potassium: 4.2 mmol/L (ref 3.5–5.1)
Sodium: 137 mmol/L (ref 135–145)
Total Bilirubin: 0.4 mg/dL (ref 0.3–1.2)
Total Protein: 7 g/dL (ref 6.5–8.1)

## 2021-10-12 LAB — LIPASE, BLOOD: Lipase: 71 U/L — ABNORMAL HIGH (ref 11–51)

## 2021-10-12 MED ORDER — IOHEXOL 300 MG/ML  SOLN
100.0000 mL | Freq: Once | INTRAMUSCULAR | Status: AC | PRN
  Administered 2021-10-12: 10:00:00 100 mL via INTRAVENOUS

## 2021-10-12 NOTE — ED Triage Notes (Addendum)
Pt c/o RLQ abdominal pain that started last night, worsening this morning. Denies n/v/d and fever.

## 2021-10-12 NOTE — ED Notes (Signed)
Patient transported to CT 

## 2021-10-12 NOTE — ED Provider Notes (Signed)
[facility] EMERGENCY DEPARTMENT Provider Note   CSN: [id number] Arrival date & time: 10/12/21  1751     History Chief Complaint  Patient presents with   Abdominal Pain    [place] Ndiaye Frisina is a 60 y.o. male.  Patient is a 60 yo male with pmh as seen below presenting for abdominal pain. Patient admits to RLQ pain that began that began last night while resting in chair. Denies radiation to groin or back. Denies testicular pain/swelling/prev hx of hernias. Denies nausea/vomiting/fevers/diarrhea. Denies rectal bleeding or previous hx of diverticulitis. Denies abdominal surgical hx.   The history is provided by the patient. No language interpreter was used.  Abdominal Pain Associated symptoms: no chest pain, no chills, no cough, no dysuria, no fever, no hematuria, no shortness of breath, no sore throat and no vomiting       Past Medical History:  Diagnosis Date   Anginal pain (HCC) 12/29/2012   CKD (chronic kidney disease) stage 3, GFR 30-59 ml/min (HCC)    Diabetes mellitus type 2, noninsulin dependent (HCC)    GERD (gastroesophageal reflux disease)    H/O hiatal hernia    Headache(784.0)    "not daily, not weekly, but very often" (12/30/2012)   HTN (hypertension)    Hyperlipidemia    Morbid obesity (HCC)    Osteoarthritis    "hands" (12/30/2012)    Patient Active Problem List   Diagnosis Date Noted   Guaiac positive stools 10/24/2016   Precordial pain 12/30/2012   HTN (hypertension)    Diabetes mellitus type 2, noninsulin dependent (HCC)    Hyperlipidemia    Morbid obesity (HCC)    CKD (chronic kidney disease) stage 3, GFR 30-59 ml/min (HCC)    Anginal pain (HCC) 12/29/2012    Past Surgical History:  Procedure Laterality Date   CARPAL TUNNEL RELEASE Left 01/08/2018   Procedure: LEFT CARPAL TUNNEL RELEASE;  Surgeon: Cindee Salt, MD;  Location: [facility];  Service: Orthopedics;  Laterality: Left;  axillary block   ELBOW ARTHROPLASTY Left 1990's   "3  spurs; ?14 staples" (12/30/2012)   KNEE ARTHROSCOPY WITH SUBCHONDROPLASTY Right 04/18/2021   Procedure: KNEE ARTHROSCOPY WITH PARTIAL MEDIAL AND LATERAL MENISCECTOMY AND DEBRIDEMENT,MICROFRACTURE medial tibial plateau;  Surgeon: Jene Every, MD;  Location: [facility];  Service: Orthopedics;  Laterality: Right;    LEFT HEART CATHETERIZATION WITH CORONARY ANGIOGRAM N/A 12/31/2012   Procedure: LEFT HEART CATHETERIZATION WITH CORONARY ANGIOGRAM;  Surgeon: Tonny Bollman, MD;  Location: [facility] CATH [facility];  Service: Cardiovascular;  Laterality: N/A;   LUMBAR DISC SURGERY  2010; 2011   "2 ruptured discs; 2 pinched nerves"   SHOULDER SURGERY Right    ULNAR NERVE TRANSPOSITION Left 01/08/2018   Procedure: DECOMPRESSION LEFT ULNAR AT ELBOW;  Surgeon: Cindee Salt, MD;  Location: [facility];  Service: Orthopedics;  Laterality: Left;  axillary block       No family history on file.  Social History   Tobacco Use   Smoking status: Former    Packs/day: 1.50    Years: 8.00    Pack years: 12.00    Types: Cigarettes    Quit date: 10/14/1982    Years since quitting: 39.0   Smokeless tobacco: Former    Types: Snuff    Quit date: 10/14/1982   Tobacco comments:    12/30/2012 "I was about 60yr old when I quit smoking cigarettes & chewing snuff"  Vaping Use   Vaping Use: Never used  Substance Use Topics  Alcohol use: No   Drug use: No    Home Medications Prior to Admission medications   Medication Sig Start Date End Date Taking? Authorizing Provider  acetaminophen (TYLENOL) 500 MG tablet Take 1,000 mg by mouth every 6 (six) hours as needed for mild pain.    [provider]  aspirin EC 81 MG tablet Take 1 tablet (81 mg total) by mouth daily. 04/19/21   Susa Day, MD  diphenhydrAMINE (BENADRYL) 25 MG tablet Take 25 mg by mouth daily.    [provider]  docusate sodium (COLACE) 100 MG capsule Take 1 capsule (100 mg total) by mouth 2 (two) times daily. 04/18/21   Susa Day, MD  famotidine (PEPCID) 20 MG tablet Take 20 mg by mouth daily. 08/17/20   [provider]  furosemide (LASIX) 40 MG tablet Take 40 mg by mouth daily as needed for fluid. 01/20/18   [provider]  gabapentin (NEURONTIN) 100 MG capsule Take 100 mg by mouth daily. 03/01/20   [provider]  JARDIANCE 25 MG TABS tablet Take 25 mg by mouth daily. 03/14/21   [provider]  meloxicam (MOBIC) 15 MG tablet Take 15 mg by mouth daily.    [provider]  methocarbamol (ROBAXIN) 500 MG tablet Take 500 mg by mouth daily as needed for muscle spasms. 02/07/21   [provider]  Multiple Vitamin (MULTIVITAMIN WITH MINERALS) TABS tablet Take 1 tablet by mouth daily.    [provider]  olmesartan-hydrochlorothiazide (BENICAR HCT) 40-25 MG per tablet Take 1 tablet by mouth daily.    [provider]  omeprazole (PRILOSEC) 40 MG capsule Take 40 mg by mouth daily.    [provider]  oxyCODONE (OXY IR/ROXICODONE) 5 MG immediate release tablet Take 1-2 tablets (5-10 mg total) by mouth every 4 (four) hours as needed for severe pain. 04/18/21   Susa Day, MD  OZEMPIC, 2 MG/DOSE, 8 MG/3ML SOPN Inject 2 mg into the skin every Friday. 04/10/21   [provider]  rosuvastatin (CRESTOR) 20 MG tablet Take 20 mg by mouth daily.    [provider]  TOUJEO MAX SOLOSTAR 300 UNIT/ML Solostar Pen Inject 80 Units into the skin every morning. 02/22/21   [provider]  vitamin C (ASCORBIC ACID) 500 MG tablet Take 500 mg by mouth daily.    [provider]  Vitamin D, Ergocalciferol, (DRISDOL) 1.25 MG (50000 UNIT) CAPS capsule Take 50,000 Units by mouth every Monday.    [provider]    Allergies    Quinolones, Tequin [gatifloxacin], and Metformin and related  Review of Systems   Review of Systems  Constitutional:  Negative for chills and fever.  HENT:  Negative for ear pain and sore throat.    Eyes:  Negative for pain and visual disturbance.  Respiratory:  Negative for cough and shortness of breath.   Cardiovascular:  Negative for chest pain and palpitations.  Gastrointestinal:  Positive for abdominal pain. Negative for vomiting.  Genitourinary:  Negative for dysuria and hematuria.  Musculoskeletal:  Negative for arthralgias and back pain.  Skin:  Negative for color change and rash.  Neurological:  Negative for seizures and syncope.  All other systems reviewed and are negative.  Physical Exam Updated Vital Signs BP 124/72 (BP Location: Left Arm)    Pulse 85    Temp 97.8 F (36.6 C) (Oral)    Resp 18    Ht 6\' 3"  (1.905 m)    Wt (!) 156  kg    SpO2 98%    BMI 43.00 kg/m   Physical Exam Vitals and nursing note reviewed.  Constitutional:      General: He is not in acute distress.    Appearance: He is well-developed.  HENT:     Head: Normocephalic and atraumatic.  Eyes:     Conjunctiva/sclera: Conjunctivae normal.  Cardiovascular:     Rate and Rhythm: Normal rate and regular rhythm.     Heart sounds: No murmur heard. Pulmonary:     Effort: Pulmonary effort is normal. No respiratory distress.     Breath sounds: Normal breath sounds.  Abdominal:     Palpations: Abdomen is soft.     Tenderness: There is abdominal tenderness in the right lower quadrant. There is no guarding.  Musculoskeletal:        General: No swelling.     Cervical back: Neck supple.  Skin:    General: Skin is warm and dry.     Capillary Refill: Capillary refill takes less than 2 seconds.  Neurological:     Mental Status: He is alert.  Psychiatric:        Mood and Affect: Mood normal.    ED Results / Procedures / Treatments   Labs (all labs ordered are listed, but only abnormal results are displayed) Labs Reviewed  COMPREHENSIVE METABOLIC PANEL - Abnormal; Notable for the following components:      Result Value   Glucose, Bld 134 (*)    BUN 32 (*)    Creatinine, Ser 1.48 (*)    GFR,  Estimated 53 (*)    All other components within normal limits  CBC WITH DIFFERENTIAL/PLATELET - Abnormal; Notable for the following components:   WBC 10.9 (*)    Neutro Abs 7.9 (*)    Abs Immature Granulocytes 0.08 (*)    All other components within normal limits  LIPASE, BLOOD - Abnormal; Notable for the following components:   Lipase 71 (*)    All other components within normal limits    EKG None  Radiology US Abdomen Complete  Result Date: 10/12/2021 CLINICAL DATA:  Right upper quadrant pain since last night EXAM: ABDOMEN ULTRASOUND COMPLETE COMPARISON:  Abdominal ultrasound 09/11/2021 FINDINGS: Image quality is degraded by body habitus and overlying bowel gas. Gallbladder: Limited visualization of the gallbladder demonstrates no shadowing stones or wall thickening. There is no definite pericholecystic fluid. Sonographic Murphy's sign was reported negative. Common bile duct: Diameter: 3 mm Liver: The liver is suboptimally evaluated. No definite focal lesions are seen. Portal vein is patent on color Doppler imaging with normal direction of blood flow towards the liver. IVC: No abnormality visualized. Pancreas: Not well evaluated.  No definite abnormality is seen. Spleen: Size and appearance within normal limits. Right Kidney: Length: 11.7 cm. Parenchymal echogenicity is within normal limits. A probable parapelvic cyst is again seen measuring approximately 1.9 cm. No other mass or hydronephrosis visualized. Left Kidney: Length: 11.7 cm. Echogenicity within normal limits. No mass or hydronephrosis visualized. Abdominal aorta: Infrarenal abdominal aorta is not well visualized, but the aorta appears to measure up to 3.4 cm in maximum dimension. Other findings: None. IMPRESSION: 1. Image quality is suboptimal due to body habitus and overlying bowel gas. 2. Within this confine, no acute finding is identified in the abdomen. 3. Possible infrarenal abdominal aortic aneurysm measuring up to 3.4 cm. This  could be further evaluated with CT to confirm, or alternatively recommend follow-up every 3 years. Electronically Signed   By: Theron AristaPeter  Noone M.D.   On: 10/12/2021 10:12   CT ABDOMEN PELVIS W CONTRAST  Result Date: 10/12/2021 CLINICAL DATA:  Right lower quadrant pain starting last night EXAM: CT ABDOMEN AND PELVIS WITH CONTRAST TECHNIQUE: Multidetector CT imaging of the abdomen and pelvis was performed using the standard protocol following bolus administration of intravenous contrast. CONTRAST:  139mL OMNIPAQUE IOHEXOL 300 MG/ML  SOLN COMPARISON:  None. FINDINGS: Lower chest: The lung bases are clear. The imaged heart is unremarkable. Hepatobiliary: The liver and gallbladder are unremarkable. There is no biliary ductal dilatation. Pancreas: There is mild fatty atrophy of the pancreas. There is no focal lesion or contour abnormality. There is no main pancreatic ductal dilatation or peripancreatic inflammatory change. Spleen: Unremarkable. Adrenals/Urinary Tract: Adrenals are unremarkable. There is mild cortical thinning of both kidneys. There are no focal lesions. Parapelvic cysts are noted bilaterally. No stones are seen. There is no hydronephrosis or hydroureter. The bladder is unremarkable. Stomach/Bowel: The stomach is unremarkable. There is no evidence of bowel obstruction. There is no abnormal bowel wall thickening or inflammatory change. The appendix is normal (2-70). Vascular/Lymphatic: The abdominal aorta is normal in course and caliber. The major branch vessels are patent. Main portal and splenic veins are patent. There is no abdominal or pelvic lymphadenopathy. Reproductive: The prostate and seminal vesicles are unremarkable. Other: There is no ascites or free air. There is a small fat containing umbilical hernia. Musculoskeletal: There is no acute osseous abnormality or aggressive osseous lesion. There is multilevel degenerative change of the spine. IMPRESSION: No acute findings in the abdomen or  pelvis to explain the patient's symptoms. Normal appendix. Electronically Signed   By: Valetta Mole M.D.   On: 10/12/2021 10:47    Procedures Procedures   Medications Ordered in ED Medications  iohexol (OMNIPAQUE) 300 MG/ML solution 100 mL (100 mLs Intravenous Contrast Given 10/12/21 1025)    ED Course  I have reviewed the triage vital signs and the nursing notes.  Pertinent labs & imaging results that were available during my care of the patient were reviewed by me and considered in my medical decision making (see chart for details).    MDM Rules/Calculators/A&P   1:22 PM 61 yo male with pmh as seen below presenting for RLQ pain x 1 day. Patient Aox3, no acute distress, afebrile, with stable vitals. Abdomen is soft with reproducible tenderness in RLQ and right groin region. No skin color changes. No hernia palpated on exam.   Stable labs. CT abdomen demonstrates no acute process. No appendicitis. No hernia visualized. No diverticulitis. No free air. No masses   Patient in no distress and has no return of pain on re-eval. Abdomen remains soft and non tender.  Detailed discussions were had with the patient regarding current findings, and need for close f/u with pcp if pain continues.The patient has been instructed to return immediately if the symptoms worsen in any way for re-evaluation. Patient verbalized understanding and is in agreement with current care plan. All questions answered prior to discharge.       Final Clinical Impression(s) / ED Diagnoses Final diagnoses:  Right lower quadrant abdominal pain    Rx / DC Orders ED Discharge Orders     None        Lianne Cure, DO [id number]

## 2021-12-27 ENCOUNTER — Ambulatory Visit (INDEPENDENT_AMBULATORY_CARE_PROVIDER_SITE_OTHER): Payer: Medicare Other | Admitting: Gastroenterology

## 2022-02-28 IMAGING — US US ABDOMEN COMPLETE
1 series · 13 of 25 positions shown · non-contrast
Comparison: Abdominal ultrasound 09/11/2021

CLINICAL DATA: Right upper quadrant pain since last night

EXAM:
ABDOMEN ULTRASOUND COMPLETE

[Series 1: us abdomen complete · 13 of 158 slices shown]
[im 1/158]
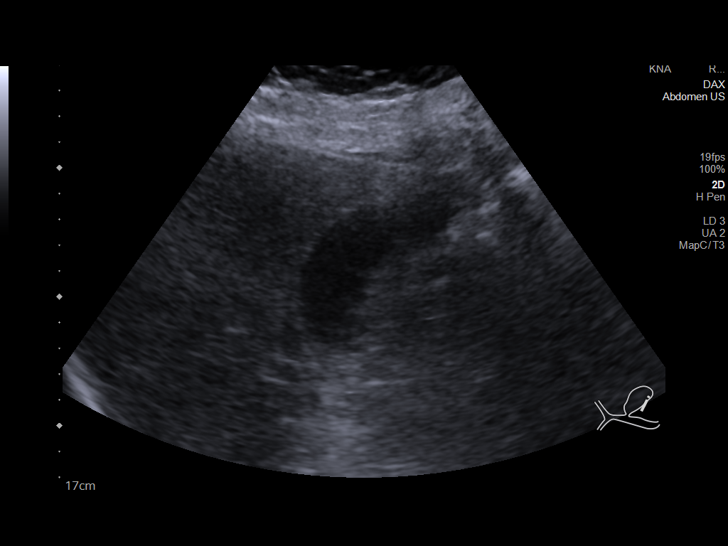
[im 14/158]
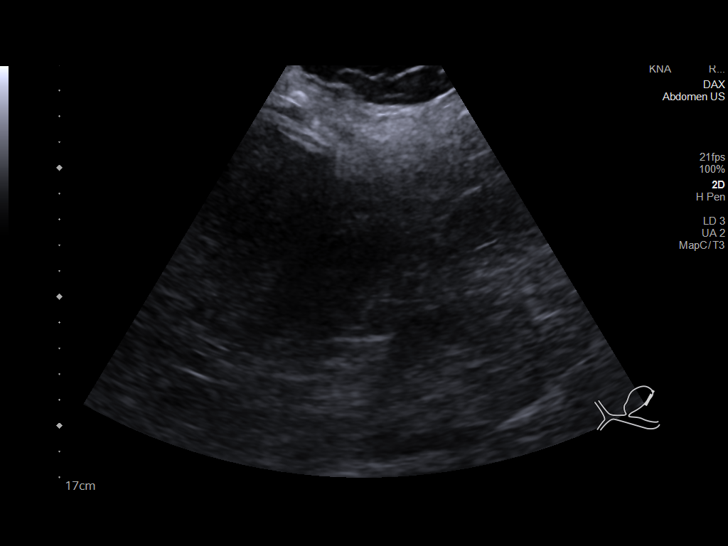
[im 27/158]
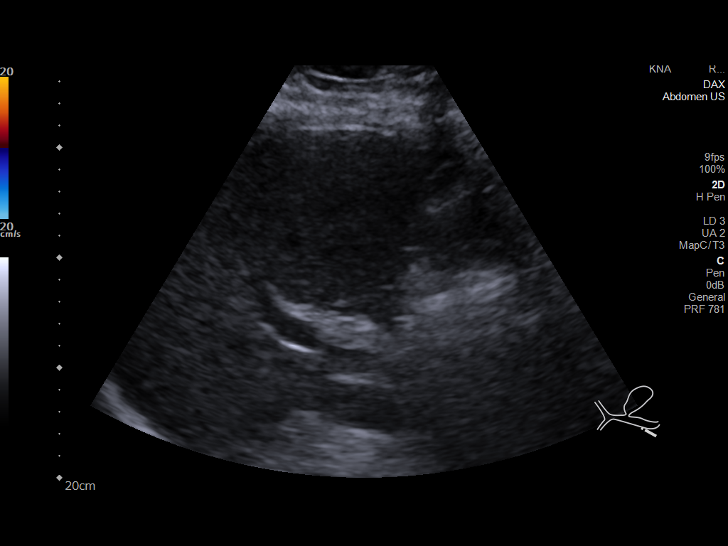
[im 40/158]
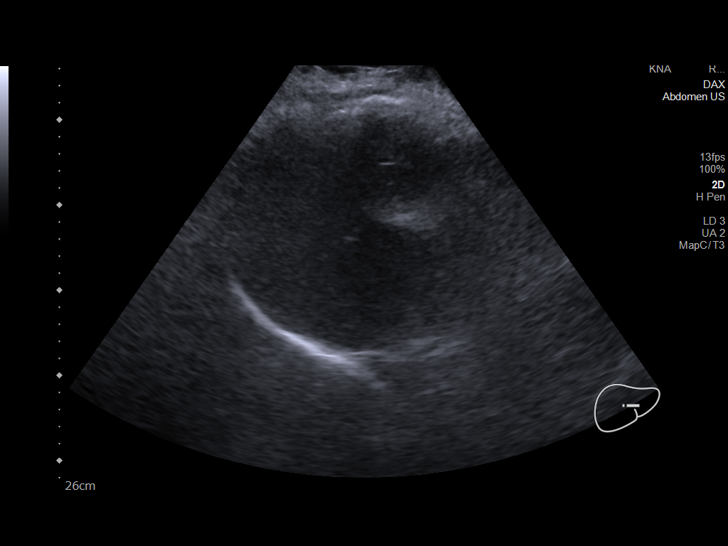
[im 53/158]
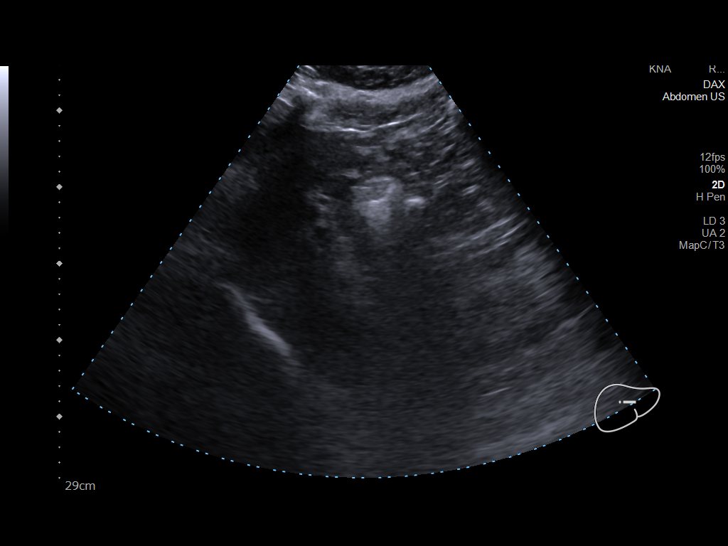
[im 66/158]
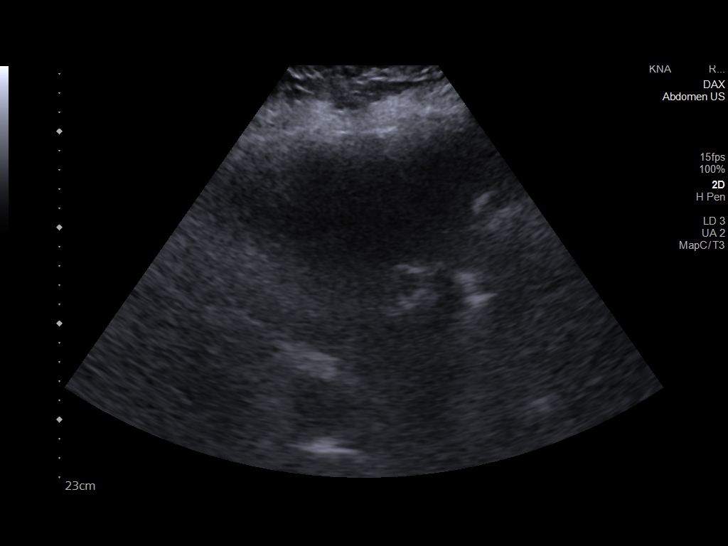
[im 79/158]
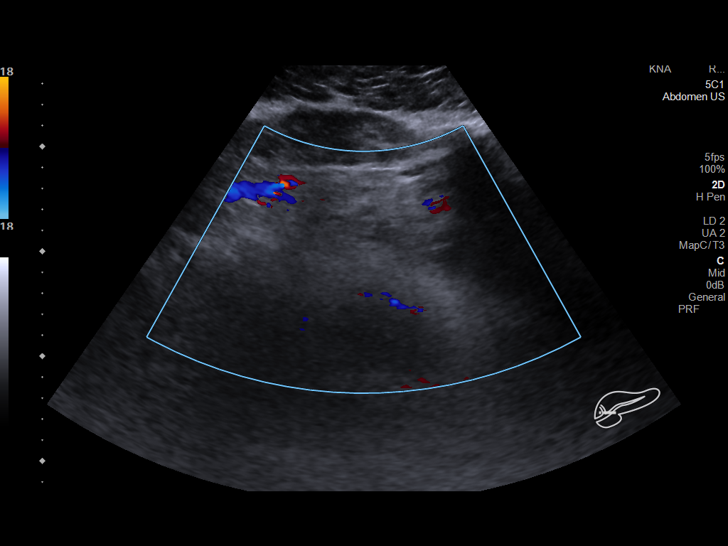
[im 92/158]
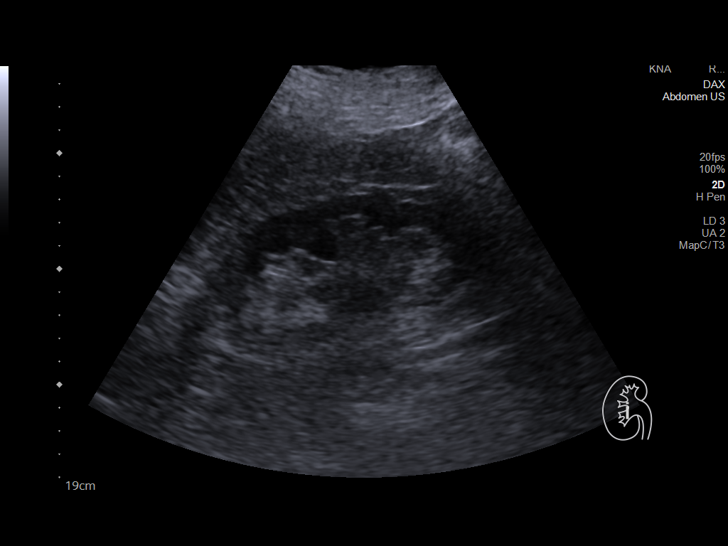
[im 105/158]
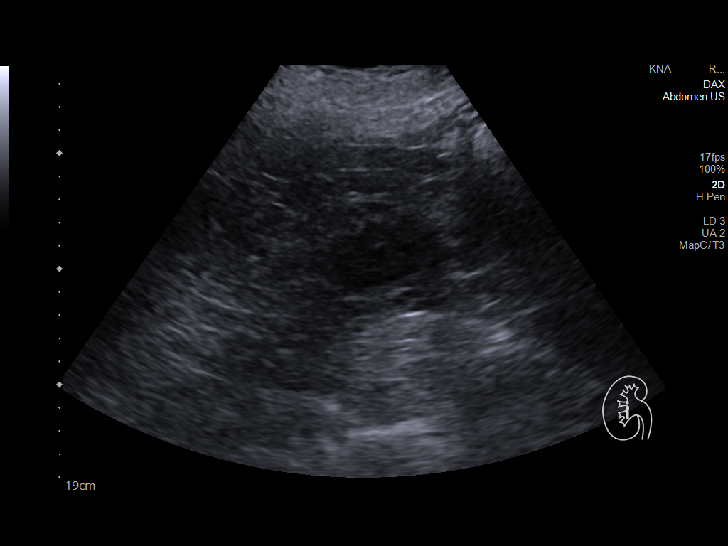
[im 118/158]
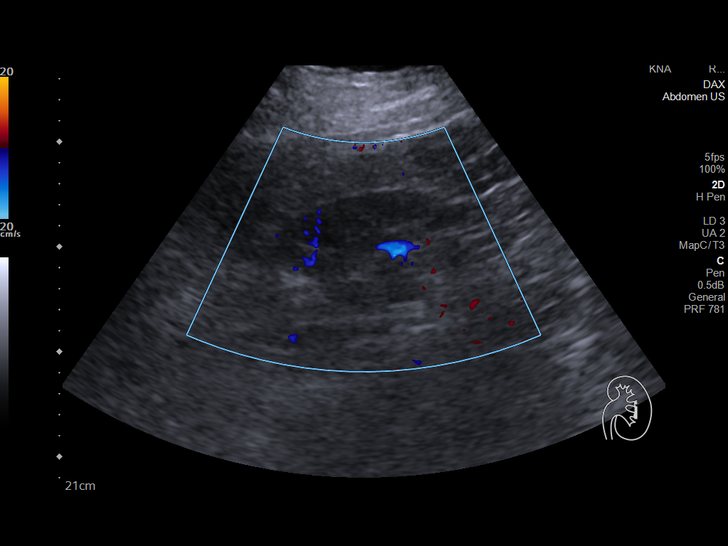
[im 131/158]
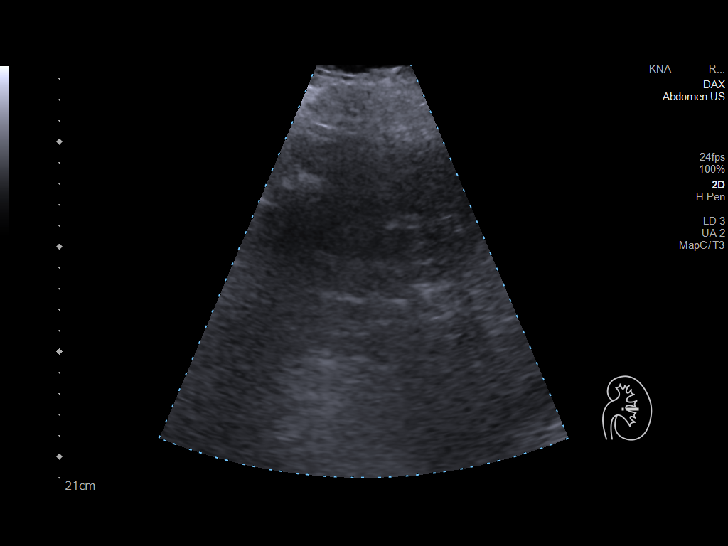
[im 144/158]
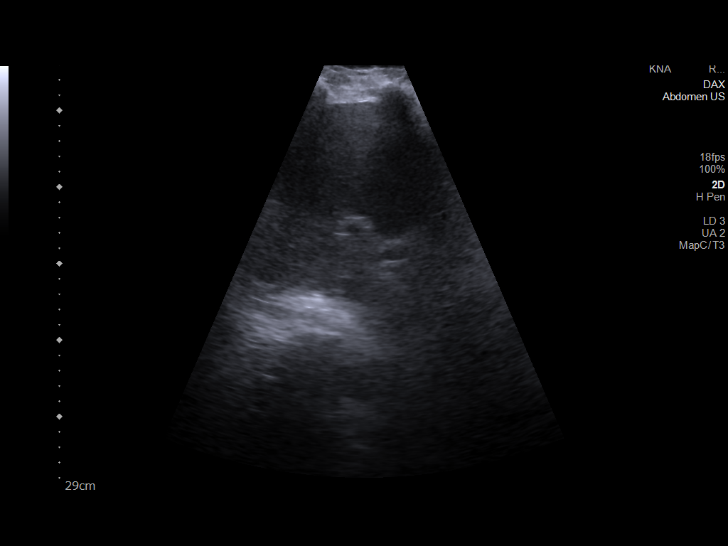
[im 158/158]
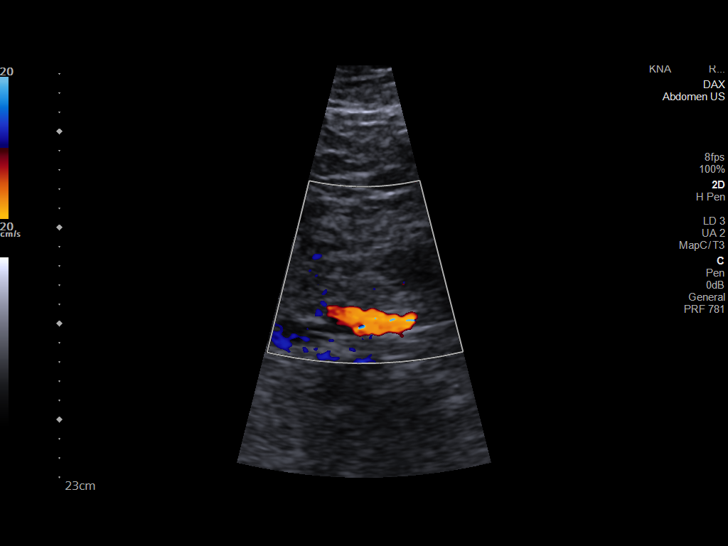

[13 of 25 positions shown; findings below may reference images not displayed]

FINDINGS: Image quality is degraded by body habitus and overlying bowel gas.

Gallbladder: Limited visualization of the gallbladder demonstrates
no shadowing stones or wall thickening. There is no definite
pericholecystic fluid. Sonographic Murphy's sign was reported
negative.

Common bile duct: Diameter: 3 mm

Liver: The liver is suboptimally evaluated. No definite focal
lesions are seen. Portal vein is patent on color Doppler imaging
with normal direction of blood flow towards the liver.

IVC: No abnormality visualized.

Pancreas: Not well evaluated.  No definite abnormality is seen.

Spleen: Size and appearance within normal limits.

Right Kidney: Length: 11.7 cm. Parenchymal echogenicity is within
normal limits. A probable parapelvic cyst is again seen measuring
approximately 1.9 cm. No other mass or hydronephrosis visualized.

Left Kidney: Length: 11.7 cm. Echogenicity within normal limits. No
mass or hydronephrosis visualized.

Abdominal aorta: Infrarenal abdominal aorta is not well visualized,
but the aorta appears to measure up to 3.4 cm in maximum dimension.

Other findings: None.
IMPRESSION: 1. Image quality is suboptimal due to body habitus and overlying
bowel gas.
2. Within this confine, no acute finding is identified in the
abdomen.
3. Possible infrarenal abdominal aortic aneurysm measuring up to
cm. This could be further evaluated with CT to confirm, or
alternatively recommend follow-up every 3 years.

## 2022-02-28 IMAGING — CT CT ABD-PELV W/ CM
2 of 5 series · 16 of 46 positions shown, 18 images · IV contrast (omnipaque)
Comparison: None.

CLINICAL DATA: Right lower quadrant pain starting last night

EXAM:
CT ABDOMEN AND PELVIS WITH CONTRAST
TECHNIQUE: Multidetector CT imaging of the abdomen and pelvis was performed
using the standard protocol following bolus administration of
intravenous contrast.
CONTRAST:  100mL OMNIPAQUE IOHEXOL 300 MG/ML  SOLN

[Series 2: axial st · axial · 0.98mm/px · z∈[+619,+1134]mm · 13 of 117 slices shown, 15 images]
[im 7/117  soft-tissue]
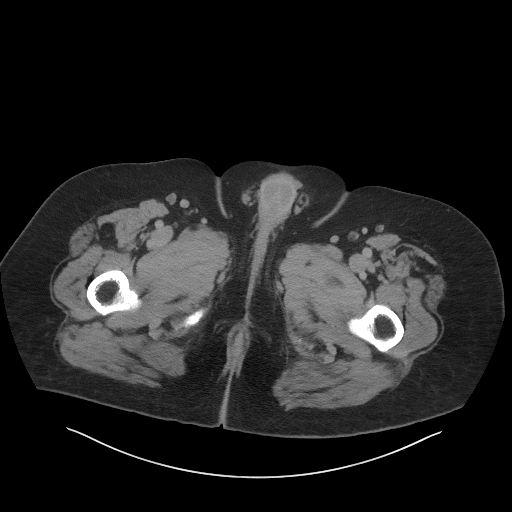
[im 7/117  bone]
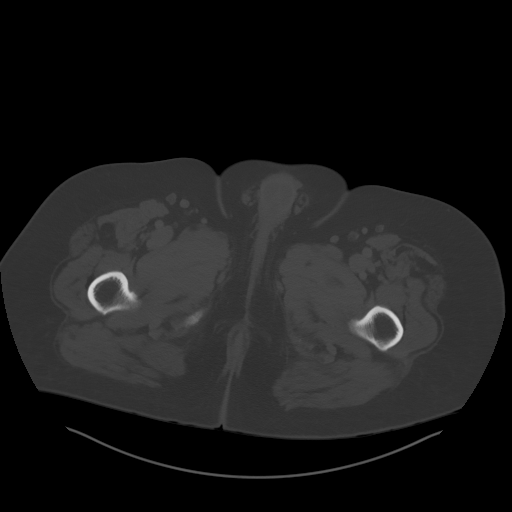
[im 19/117  soft-tissue]
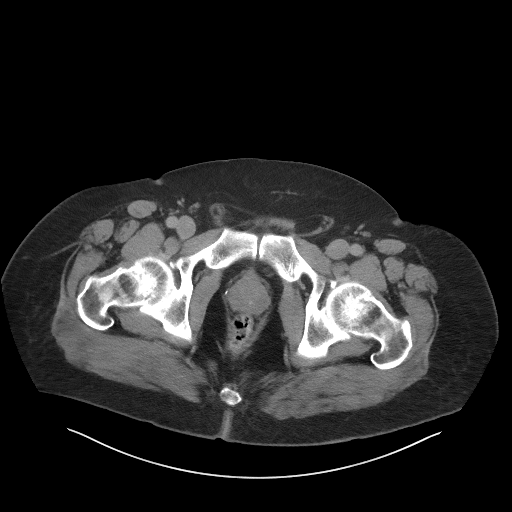
[im 25/117  soft-tissue]
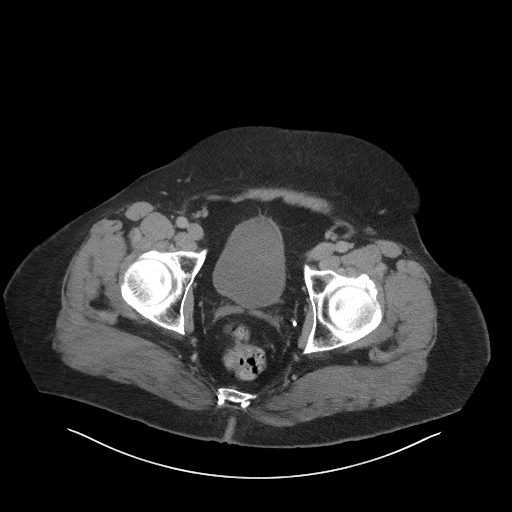
[im 31/117  soft-tissue]
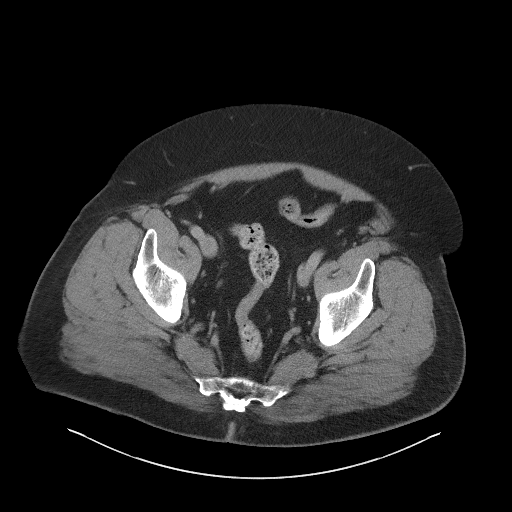
[im 43/117  soft-tissue]
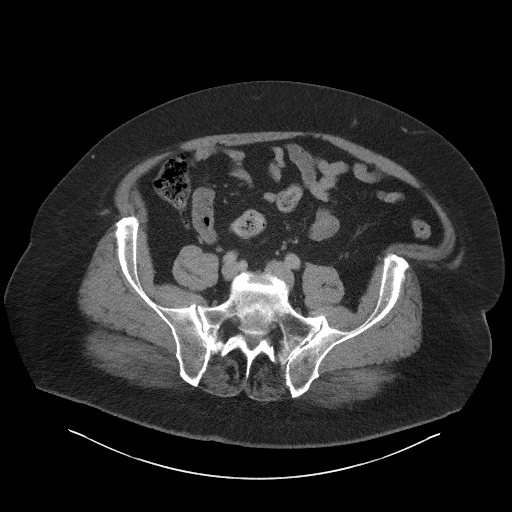
[im 49/117  soft-tissue]
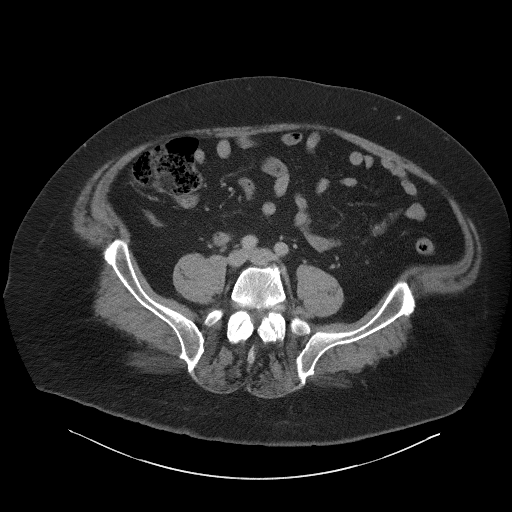
[im 62/117  soft-tissue]
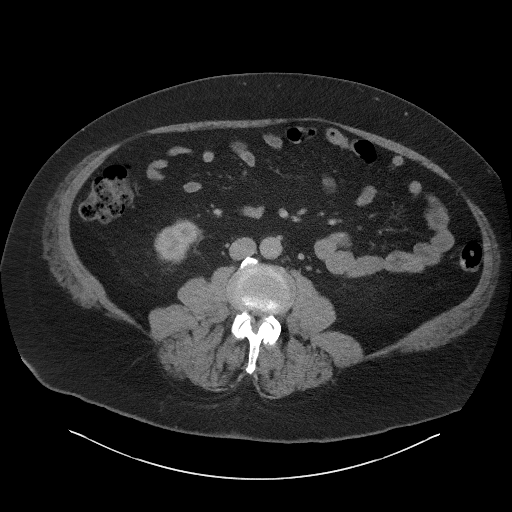
[im 68/117  soft-tissue]
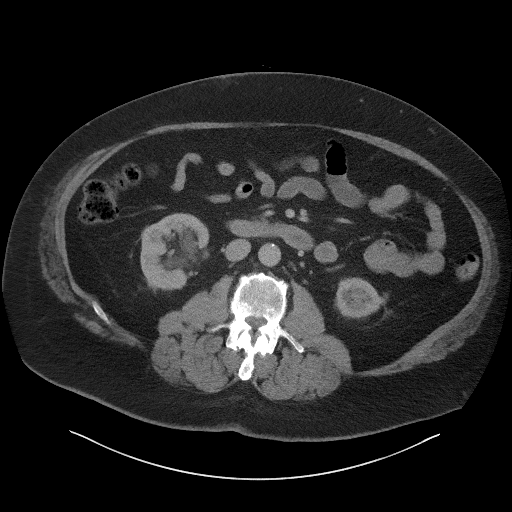
[im 74/117  soft-tissue]
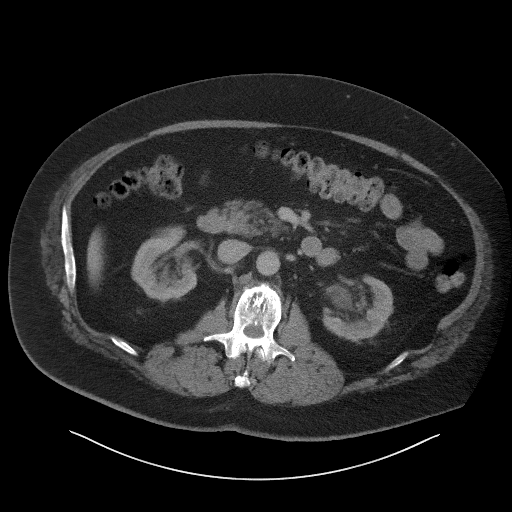
[im 74/117  bone]
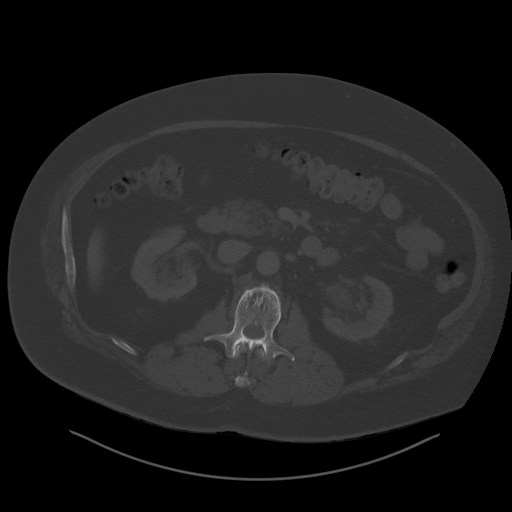
[im 86/117  soft-tissue]
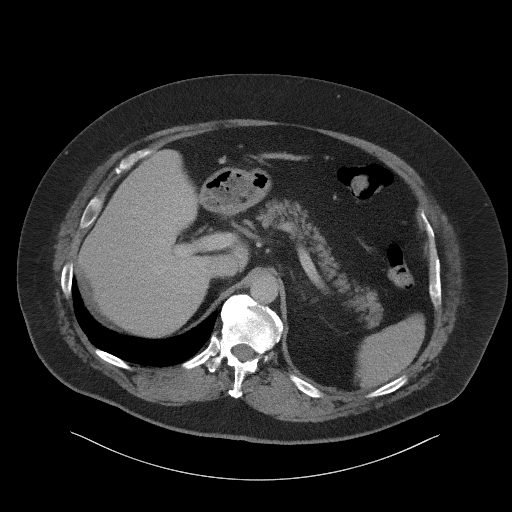
[im 92/117  soft-tissue]
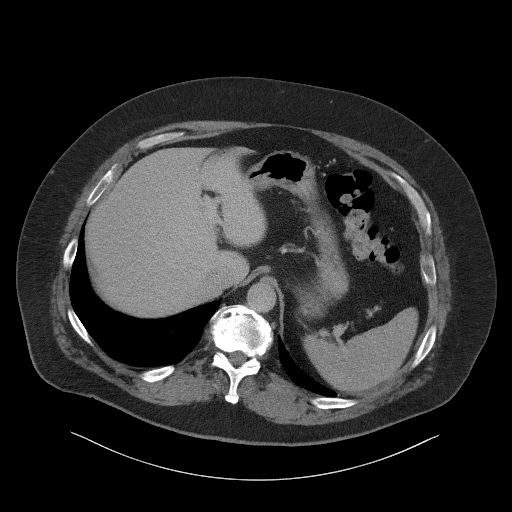
[im 98/117  soft-tissue]
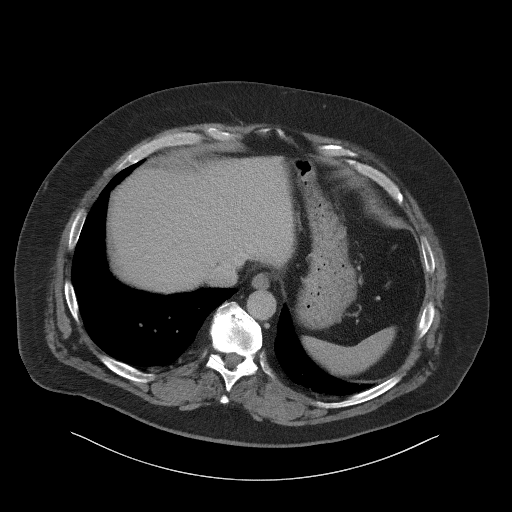
[im 110/117  soft-tissue]
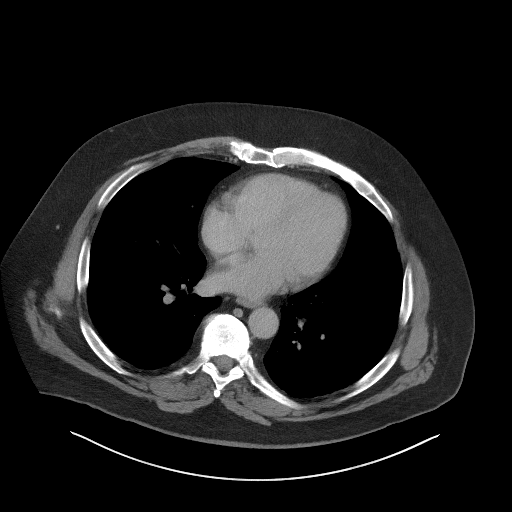

[Series 5: coronal st · coronal · 1.00mm/px · 3 of 129 slices shown]
[im 43/129  soft-tissue]
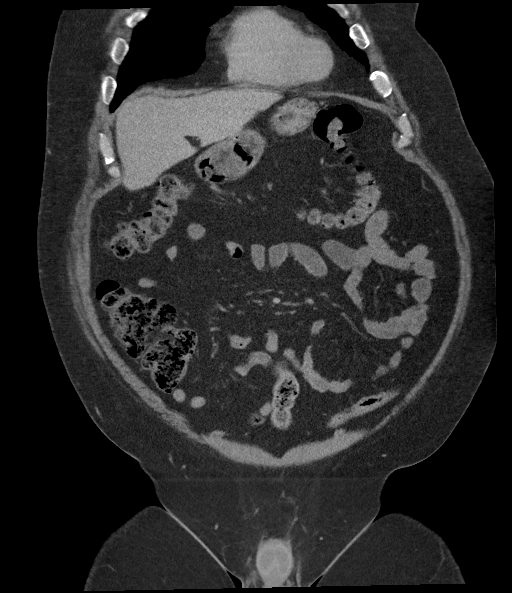
[im 57/129  soft-tissue]
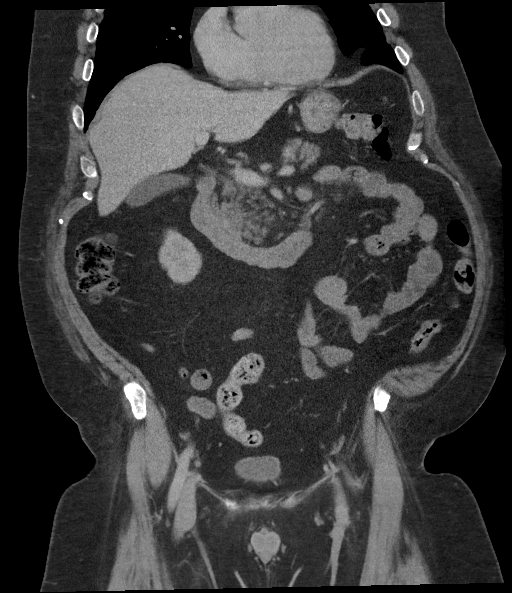
[im 72/129  soft-tissue]
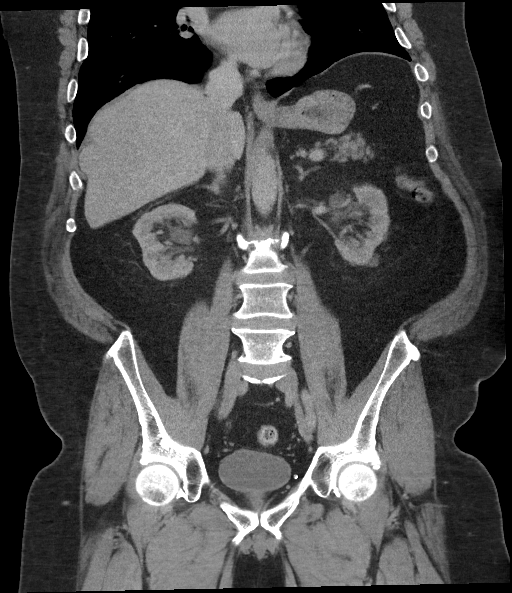

[16 of 46 positions shown; findings below may reference images not displayed]

FINDINGS: Lower chest: The lung bases are clear. The imaged heart is
unremarkable.

Hepatobiliary: The liver and gallbladder are unremarkable. There is
no biliary ductal dilatation.

Pancreas: There is mild fatty atrophy of the pancreas. There is no
focal lesion or contour abnormality. There is no main pancreatic
ductal dilatation or peripancreatic inflammatory change.

Spleen: Unremarkable.

Adrenals/Urinary Tract: Adrenals are unremarkable.

There is mild cortical thinning of both kidneys. There are no focal
lesions. Parapelvic cysts are noted bilaterally. No stones are seen.
There is no hydronephrosis or hydroureter. The bladder is
unremarkable.

Stomach/Bowel: The stomach is unremarkable. There is no evidence of
bowel obstruction. There is no abnormal bowel wall thickening or
inflammatory change. The appendix is normal (2-70).

Vascular/Lymphatic: The abdominal aorta is normal in course and
caliber. The major branch vessels are patent. Main portal and
splenic veins are patent. There is no abdominal or pelvic
lymphadenopathy.

Reproductive: The prostate and seminal vesicles are unremarkable.

Other: There is no ascites or free air. There is a small fat
containing umbilical hernia.

Musculoskeletal: There is no acute osseous abnormality or aggressive
osseous lesion. There is multilevel degenerative change of the
spine.
IMPRESSION: No acute findings in the abdomen or pelvis to explain the patient's
symptoms. Normal appendix.

## 2022-07-02 ENCOUNTER — Encounter (INDEPENDENT_AMBULATORY_CARE_PROVIDER_SITE_OTHER): Payer: Self-pay | Admitting: *Deleted

## 2022-07-10 ENCOUNTER — Other Ambulatory Visit (INDEPENDENT_AMBULATORY_CARE_PROVIDER_SITE_OTHER): Payer: Self-pay

## 2022-07-10 ENCOUNTER — Encounter (INDEPENDENT_AMBULATORY_CARE_PROVIDER_SITE_OTHER): Payer: Self-pay | Admitting: *Deleted

## 2022-07-10 DIAGNOSIS — Z86010 Personal history of colonic polyps: Secondary | ICD-10-CM

## 2022-08-02 ENCOUNTER — Encounter (INDEPENDENT_AMBULATORY_CARE_PROVIDER_SITE_OTHER): Payer: Self-pay

## 2022-08-02 ENCOUNTER — Telehealth (INDEPENDENT_AMBULATORY_CARE_PROVIDER_SITE_OTHER): Payer: Self-pay

## 2022-08-02 MED ORDER — PEG 3350-KCL-NA BICARB-NACL 420 G PO SOLR: 4000.0000 mL | ORAL | 0 refills | Status: DC

## 2022-08-02 NOTE — Telephone Encounter (Signed)
Referring MD/PCP: Dr Talbert Forest Corrington   Procedure: Tcs  Reason/Indication:  Hx of colon polyps  Has patient had this procedure before?  Yes   If so, when, by whom and where?  Last Year  Is there a family history of colon cancer?  No   Who?  What age when diagnosed?    Is patient diabetic? If yes, Type 1 or Type 2   yes, type 2       Does patient have prosthetic heart valve or mechanical valve?  No   Do you have a pacemaker/defibrillator?  No   Has patient ever had endocarditis/atrial fibrillation? No   Does patient use oxygen? No   Has patient had joint replacement within last 12 months?  No  Is patient constipated or do they take laxatives? No   Does patient have a history of alcohol/drug use?  No   Have you had a stroke/heart attack last 6 mths? No   Do you take medicine for weight loss?  No   For male patients,: have you had a hysterectomy n/a                      are you post menopausal n/a                      do you still have your menstrual cycle n/a  Is patient on blood thinner such as Coumadin, Plavix and/or Aspirin? Yes   Medications: gabapentin 100 mg daily, olmesartan/hctz 40-25 daily, Vit D 50,000 Iu daily, rosuvastatin 20 mg daily, famotidine 20 mg daily, Jardiance 25 mg daily, furosemide 40 mg 1/2 pill daily, fluoxetine 20 mg daily, rapid release 500 mg, Tylenol bid prn, asa 81 mg daily, stool softener 100 mg daily, MVI daily, ozempic 8 mg/3 ml 2mg  once a week, Toujeo Max 300 iu /ml 80 units daily  Allergies: Metformin, Tequin   Medication Adjustment per Dr Jenetta Downer Hold Ozempic 7 days prior to procedure, 1/2 dose of insulin the evening prior and no diabetic meds the morning of procedure  Procedure date & time: 09/03/22 at 1:00 pm

## 2022-08-02 NOTE — Telephone Encounter (Signed)
[name], CMA  ?

## 2022-08-14 HISTORY — PX: REPLACEMENT TOTAL KNEE: SUR1224

## 2022-08-29 ENCOUNTER — Other Ambulatory Visit (HOSPITAL_COMMUNITY): Payer: Medicare Other

## 2022-09-03 ENCOUNTER — Encounter (HOSPITAL_COMMUNITY): Payer: Self-pay

## 2022-09-03 ENCOUNTER — Ambulatory Visit (HOSPITAL_COMMUNITY): Admit: 2022-09-03 | Payer: Medicare Other | Admitting: Gastroenterology

## 2022-09-03 SURGERY — COLONOSCOPY WITH PROPOFOL
Anesthesia: Monitor Anesthesia Care

## 2022-09-16 ENCOUNTER — Ambulatory Visit: Payer: Medicare Other

## 2022-09-23 ENCOUNTER — Ambulatory Visit: Payer: Medicare Other | Attending: Orthopaedic Surgery

## 2022-09-23 ENCOUNTER — Ambulatory Visit: Payer: Medicare Other | Admitting: Physical Therapy

## 2022-09-23 ENCOUNTER — Other Ambulatory Visit: Payer: Self-pay

## 2022-09-23 DIAGNOSIS — M6281 Muscle weakness (generalized): Secondary | ICD-10-CM | POA: Insufficient documentation

## 2022-09-23 DIAGNOSIS — R600 Localized edema: Secondary | ICD-10-CM | POA: Insufficient documentation

## 2022-09-23 DIAGNOSIS — M25561 Pain in right knee: Secondary | ICD-10-CM | POA: Diagnosis present

## 2022-09-23 DIAGNOSIS — M25661 Stiffness of right knee, not elsewhere classified: Secondary | ICD-10-CM | POA: Insufficient documentation

## 2022-09-23 NOTE — Therapy (Signed)
OUTPATIENT PHYSICAL THERAPY LOWER EXTREMITY EVALUATION   Patient Name: [name] MRN: [id number] DOB:08-08-1961, 61 y.o., male Today's Date: 09/23/2022  END OF SESSION:  PT End of Session - 09/23/22 1032     Visit Number 1    Number of Visits 8    Date for PT Re-Evaluation 12/06/22    PT Start Time 1031    PT Stop Time 1115    PT Time Calculation (min) 44 min    Activity Tolerance Patient tolerated treatment well    Behavior During Therapy [facility] for tasks assessed/performed             Past Medical History:  Diagnosis Date   Anginal pain (HCC) 12/29/2012   CKD (chronic kidney disease) stage 3, GFR 30-59 ml/min (HCC)    Diabetes mellitus type 2, noninsulin dependent (HCC)    GERD (gastroesophageal reflux disease)    H/O hiatal hernia    Headache(784.0)    "not daily, not weekly, but very often" (12/30/2012)   HTN (hypertension)    Hyperlipidemia    Morbid obesity (HCC)    Osteoarthritis    "hands" (12/30/2012)   Past Surgical History:  Procedure Laterality Date   CARPAL TUNNEL RELEASE Left 01/08/2018   Procedure: LEFT CARPAL TUNNEL RELEASE;  Surgeon: Cindee Salt, MD;  Location: [facility];  Service: Orthopedics;  Laterality: Left;  axillary block   ELBOW ARTHROPLASTY Left 1990's   "3 spurs; ?14 staples" (12/30/2012)   KNEE ARTHROSCOPY WITH SUBCHONDROPLASTY Right 04/18/2021   Procedure: KNEE ARTHROSCOPY WITH PARTIAL MEDIAL AND LATERAL MENISCECTOMY AND DEBRIDEMENT,MICROFRACTURE medial tibial plateau;  Surgeon: Jene Every, MD;  Location: [facility];  Service: Orthopedics;  Laterality: Right;    LEFT HEART CATHETERIZATION WITH CORONARY ANGIOGRAM N/A 12/31/2012   Procedure: LEFT HEART CATHETERIZATION WITH CORONARY ANGIOGRAM;  Surgeon: Tonny Bollman, MD;  Location: [facility] CATH LAB;  Service: Cardiovascular;  Laterality: N/A;   LUMBAR DISC SURGERY  2010; 2011   "2 ruptured discs; 2 pinched nerves"   SHOULDER SURGERY Right    ULNAR NERVE TRANSPOSITION  Left 01/08/2018   Procedure: DECOMPRESSION LEFT ULNAR AT ELBOW;  Surgeon: Cindee Salt, MD;  Location: [facility];  Service: Orthopedics;  Laterality: Left;  axillary block   Patient Active Problem List   Diagnosis Date Noted   Guaiac positive stools 10/24/2016   Precordial pain 12/30/2012   HTN (hypertension)    Diabetes mellitus type 2, noninsulin dependent (HCC)    Hyperlipidemia    Morbid obesity (HCC)    CKD (chronic kidney disease) stage 3, GFR 30-59 ml/min (HCC)    Anginal pain (HCC) 12/29/2012    PCP: Corrington, Meredith Mody, MD  REFERRING PROVIDER: Nyra Capes, MD   REFERRING DIAG: Unilateral primary osteoarthritis, right knee   THERAPY DIAG:  Acute pain of right knee  Stiffness of right knee, not elsewhere classified  Localized edema  Rationale for Evaluation and Treatment: Rehabilitation  ONSET DATE: 09/09/22  SUBJECTIVE:   SUBJECTIVE STATEMENT: Patient reports that he had his right knee replaced on 09/09/22. He spent one night in the hospital prior to going on home on 09/10/22. He had about five visits of home health physical therapy. He notes that he has been continuing to do the HEP that he was given at home.   PERTINENT HISTORY: Hypertension, diabetes, chronic kidney disease, obesity, and osteoarthritis PAIN:  Are you having pain? Yes: NPRS scale: 8/10 Pain location: right thigh Pain description: sore Aggravating factors: bending his knee, straight leg  raise Relieving factors: ice  PRECAUTIONS: None  WEIGHT BEARING RESTRICTIONS: No  FALLS:  Has patient fallen in last 6 months? No  LIVING ENVIRONMENT: Lives with: lives alone Lives in: House/apartment Stairs: Yes: External: 2 steps; none Has following equipment at home: Walker - 2 wheeled  OCCUPATION: disabled   PLOF: Independent  PATIENT GOALS: reduced pain, improved mobility (he was walking with a walker prior to surgery), and be able to walk without a walker  NEXT MD  VISIT: 09/24/22  OBJECTIVE:   PATIENT SURVEYS:  FOTO 45.26  COGNITION: Overall cognitive status: Within functional limits for tasks assessed     SENSATION: Patient reports mild tingling around his knee  EDEMA:  Moderate right knee edema observed with no redness or bruising  PALPATION: TTP: Right gastroc  LOWER EXTREMITY ROM:  Active ROM Right eval Left eval  Hip flexion    Hip extension    Hip abduction    Hip adduction    Hip internal rotation    Hip external rotation    Knee flexion 109/112 (PROM) 141  Knee extension 5 0  Ankle dorsiflexion    Ankle plantarflexion    Ankle inversion    Ankle eversion     (Blank rows = not tested)  LOWER EXTREMITY MMT: not tested due to surgical condition  GAIT: Assistive device utilized: Environmental consultant - 2 wheeled Level of assistance: Modified independence Comments: Step through pattern with decreased gait speed and stride length   TODAY'S TREATMENT:                                                                                                                              DATE:                                     12/11 EXERCISE LOG  Exercise Repetitions and Resistance Comments  SLR  12 reps   Heel slide 20 reps                Blank cell = exercise not performed today  Modalities  Date:  Vaso: Knee, 34 degrees; low pressure, 15 mins, Pain and Edema  PATIENT EDUCATION:  Education details: Plan of care, HEP, prognosis, healing, and goals for therapy Person educated: Patient Education method: Explanation Education comprehension: verbalized understanding  HOME EXERCISE PROGRAM: Patient was encouraged to continue with the HEP provided by his home health therapist  ASSESSMENT:  CLINICAL IMPRESSION: Patient is a 61 y.o. male who was seen today for physical therapy evaluation and treatment following a right total knee arthroplasty on 09/09/22.  He presented with moderate to high pain severity and irritability with active  knee flexion being the most aggravating to his familiar symptoms.  He exhibited moderate right knee edema, but no redness, heat, or significant tenderness to palpation.  His previous HEP was reviewed and he was encouraged to continue  with these interventions.  Recommend that he continue with skilled physical therapy to address his remaining impairments to return to his prior level of function.  OBJECTIVE IMPAIRMENTS: Abnormal gait, decreased activity tolerance, decreased mobility, difficulty walking, decreased ROM, decreased strength, hypomobility, increased edema, and pain.   ACTIVITY LIMITATIONS: carrying, lifting, standing, squatting, stairs, transfers, dressing, and locomotion level  PARTICIPATION LIMITATIONS: meal prep, cleaning, laundry, and community activity  PERSONAL FACTORS: 3+ comorbidities: Hypertension, diabetes, chronic kidney disease, obesity, and osteoarthritis  are also affecting patient's functional outcome.   REHAB POTENTIAL: Good  CLINICAL DECISION MAKING: Stable/uncomplicated  EVALUATION COMPLEXITY: Low   GOALS: Goals reviewed with patient? Yes  LONG TERM GOALS: Target date: 10/21/22  Patient will be independent with his HEP. Baseline:  Goal status: INITIAL  2.  Patient will be able to demonstrate at least 120 degrees of right knee flexion. Baseline:  Goal status: INITIAL  3.  Patient will be able to safely ambulate at least 80 feet with a single-point cane or the least restrictive assistive device for improved household and community mobility. Baseline:  Goal status: INITIAL  4.  Patient will be able to complete his daily activities without his familiar pain exceeding 5/10. Baseline:  Goal status: INITIAL   PLAN:  PT FREQUENCY: 2x/week  PT DURATION: 4 weeks  PLANNED INTERVENTIONS: Therapeutic exercises, Therapeutic activity, Neuromuscular re-education, Balance training, Gait training, Patient/Family education, Self Care, Joint mobilization, Stair  training, Electrical stimulation, Cryotherapy, Moist heat, Vasopneumatic device, Manual therapy, and Re-evaluation  PLAN FOR NEXT SESSION: NuStep, lower extremity strengthening, manual therapy, and modalities as needed   Granville Lewis, PT 09/23/2022, 2:07 PM

## 2022-09-26 ENCOUNTER — Ambulatory Visit: Payer: Medicare Other | Admitting: Physical Therapy

## 2022-09-26 ENCOUNTER — Encounter: Payer: Self-pay | Admitting: Physical Therapy

## 2022-09-26 DIAGNOSIS — M6281 Muscle weakness (generalized): Secondary | ICD-10-CM

## 2022-09-26 DIAGNOSIS — M25661 Stiffness of right knee, not elsewhere classified: Secondary | ICD-10-CM

## 2022-09-26 DIAGNOSIS — M25561 Pain in right knee: Secondary | ICD-10-CM | POA: Diagnosis not present

## 2022-09-26 DIAGNOSIS — R600 Localized edema: Secondary | ICD-10-CM

## 2022-09-26 NOTE — Therapy (Signed)
OUTPATIENT PHYSICAL THERAPY LOWER EXTREMITY TREATMENT   Patient Name: [name] MRN: [id number] DOB:08/23/1961, 61 y.o., male Today's Date: 09/26/2022  END OF SESSION:  PT End of Session - 09/26/22 1045     Visit Number 2    Number of Visits 8    Date for PT Re-Evaluation 12/06/22    PT Start Time 1030    PT Stop Time 1113    PT Time Calculation (min) 43 min    Activity Tolerance Patient tolerated treatment well    Behavior During Therapy [facility] for tasks assessed/performed             Past Medical History:  Diagnosis Date   Anginal pain (HCC) 12/29/2012   CKD (chronic kidney disease) stage 3, GFR 30-59 ml/min (HCC)    Diabetes mellitus type 2, noninsulin dependent (HCC)    GERD (gastroesophageal reflux disease)    H/O hiatal hernia    Headache(784.0)    "not daily, not weekly, but very often" (12/30/2012)   HTN (hypertension)    Hyperlipidemia    Morbid obesity (HCC)    Osteoarthritis    "hands" (12/30/2012)   Past Surgical History:  Procedure Laterality Date   CARPAL TUNNEL RELEASE Left 01/08/2018   Procedure: LEFT CARPAL TUNNEL RELEASE;  Surgeon: Cindee Salt, MD;  Location: [facility];  Service: Orthopedics;  Laterality: Left;  axillary block   ELBOW ARTHROPLASTY Left 1990's   "3 spurs; ?14 staples" (12/30/2012)   KNEE ARTHROSCOPY WITH SUBCHONDROPLASTY Right 04/18/2021   Procedure: KNEE ARTHROSCOPY WITH PARTIAL MEDIAL AND LATERAL MENISCECTOMY AND DEBRIDEMENT,MICROFRACTURE medial tibial plateau;  Surgeon: Jene Every, MD;  Location: [facility];  Service: Orthopedics;  Laterality: Right;    LEFT HEART CATHETERIZATION WITH CORONARY ANGIOGRAM N/A 12/31/2012   Procedure: LEFT HEART CATHETERIZATION WITH CORONARY ANGIOGRAM;  Surgeon: Tonny Bollman, MD;  Location: [facility] CATH LAB;  Service: Cardiovascular;  Laterality: N/A;   LUMBAR DISC SURGERY  2010; 2011   "2 ruptured discs; 2 pinched nerves"   SHOULDER SURGERY Right    ULNAR NERVE TRANSPOSITION Left  01/08/2018   Procedure: DECOMPRESSION LEFT ULNAR AT ELBOW;  Surgeon: Cindee Salt, MD;  Location: [facility];  Service: Orthopedics;  Laterality: Left;  axillary block   Patient Active Problem List   Diagnosis Date Noted   Guaiac positive stools 10/24/2016   Precordial pain 12/30/2012   HTN (hypertension)    Diabetes mellitus type 2, noninsulin dependent (HCC)    Hyperlipidemia    Morbid obesity (HCC)    CKD (chronic kidney disease) stage 3, GFR 30-59 ml/min (HCC)    Anginal pain (HCC) 12/29/2012    PCP: Corrington, Meredith Mody, MD  REFERRING PROVIDER: Nyra Capes, MD   REFERRING DIAG: Unilateral primary osteoarthritis, right knee   THERAPY DIAG:  Acute pain of right knee  Stiffness of right knee, not elsewhere classified  Localized edema  Muscle weakness (generalized)  Rationale for Evaluation and Treatment: Rehabilitation  ONSET DATE: 09/09/22  SUBJECTIVE:   SUBJECTIVE STATEMENT: Quads sore but doing good.  PERTINENT HISTORY: Hypertension, diabetes, chronic kidney disease, obesity, and osteoarthritis PAIN:  Are you having pain? Yes: NPRS scale: 2/10 Pain location: right thigh Pain description: sore Aggravating factors: bending his knee, straight leg raise Relieving factors: ice  PRECAUTIONS: None    Level of assistance: Modified independence Comments: Step through pattern with decreased gait speed and stride length   TODAY'S TREATMENT:  DATE:                                     09/26/22 EXERCISE LOG  Exercise Repetitions and Resistance Comments  Nustep on level 1. 15 minutes moving seat forward x 2 to increase flexion.   SAQ's with 2# X 3 minutes.               In supine:  PROM x 5 minutes to patient's right knee with focus on flexion.  Modalities  Date:  Vaso: Knee, 34 degrees; low pressure, 15 mins, Pain  and Edema with elevation.  ASSESSMENT:  CLINICAL IMPRESSION: The patient is doing very well.  Reporting some right quadriceps soreness.  He did the Nustep without complaint.  He exhibits full right knee extension.      GOALS: Goals reviewed with patient? Yes  LONG TERM GOALS: Target date: 10/21/22  Patient will be independent with his HEP. Baseline:  Goal status: INITIAL  2.  Patient will be able to demonstrate at least 120 degrees of right knee flexion. Baseline:  Goal status: INITIAL  3.  Patient will be able to safely ambulate at least 80 feet with a single-point cane or the least restrictive assistive device for improved household and community mobility. Baseline:  Goal status: INITIAL  4.  Patient will be able to complete his daily activities without his familiar pain exceeding 5/10. Baseline:  Goal status: INITIAL   PLAN:  PT FREQUENCY: 2x/week  PT DURATION: 4 weeks  PLANNED INTERVENTIONS: Therapeutic exercises, Therapeutic activity, Neuromuscular re-education, Balance training, Gait training, Patient/Family education, Self Care, Joint mobilization, Stair training, Electrical stimulation, Cryotherapy, Moist heat, Vasopneumatic device, Manual therapy, and Re-evaluation  PLAN FOR NEXT SESSION: NuStep, lower extremity strengthening, manual therapy, and modalities as needed   Okie Jansson, [place], PT 09/26/2022, 11:34 AM

## 2022-09-30 ENCOUNTER — Ambulatory Visit: Payer: Medicare Other

## 2022-09-30 DIAGNOSIS — R600 Localized edema: Secondary | ICD-10-CM

## 2022-09-30 DIAGNOSIS — M25561 Pain in right knee: Secondary | ICD-10-CM | POA: Diagnosis not present

## 2022-09-30 DIAGNOSIS — M25661 Stiffness of right knee, not elsewhere classified: Secondary | ICD-10-CM

## 2022-09-30 NOTE — Therapy (Signed)
OUTPATIENT PHYSICAL THERAPY LOWER EXTREMITY TREATMENT   Patient Name: [name] MRN: [id number] DOB:Mar 05, 1961, 61 y.o., male Today's Date: 09/30/2022  END OF SESSION:  PT End of Session - 09/30/22 0902     Visit Number 3    Number of Visits 8    Date for PT Re-Evaluation 12/06/22    PT Start Time 0900    PT Stop Time 0945    PT Time Calculation (min) 45 min    Activity Tolerance Patient tolerated treatment well    Behavior During Therapy [facility] for tasks assessed/performed             Past Medical History:  Diagnosis Date   Anginal pain (HCC) 12/29/2012   CKD (chronic kidney disease) stage 3, GFR 30-59 ml/min (HCC)    Diabetes mellitus type 2, noninsulin dependent (HCC)    GERD (gastroesophageal reflux disease)    H/O hiatal hernia    Headache(784.0)    "not daily, not weekly, but very often" (12/30/2012)   HTN (hypertension)    Hyperlipidemia    Morbid obesity (HCC)    Osteoarthritis    "hands" (12/30/2012)   Past Surgical History:  Procedure Laterality Date   CARPAL TUNNEL RELEASE Left 01/08/2018   Procedure: LEFT CARPAL TUNNEL RELEASE;  Surgeon: Cindee Salt, MD;  Location: [facility];  Service: Orthopedics;  Laterality: Left;  axillary block   ELBOW ARTHROPLASTY Left 1990's   "3 spurs; ?14 staples" (12/30/2012)   KNEE ARTHROSCOPY WITH SUBCHONDROPLASTY Right 04/18/2021   Procedure: KNEE ARTHROSCOPY WITH PARTIAL MEDIAL AND LATERAL MENISCECTOMY AND DEBRIDEMENT,MICROFRACTURE medial tibial plateau;  Surgeon: Jene Every, MD;  Location: [facility];  Service: Orthopedics;  Laterality: Right;    LEFT HEART CATHETERIZATION WITH CORONARY ANGIOGRAM N/A 12/31/2012   Procedure: LEFT HEART CATHETERIZATION WITH CORONARY ANGIOGRAM;  Surgeon: Tonny Bollman, MD;  Location: [facility];  Service: Cardiovascular;  Laterality: N/A;   LUMBAR DISC SURGERY  2010; 2011   "2 ruptured discs; 2 pinched nerves"   SHOULDER SURGERY Right    ULNAR NERVE TRANSPOSITION Left  01/08/2018   Procedure: DECOMPRESSION LEFT ULNAR AT ELBOW;  Surgeon: Cindee Salt, MD;  Location: [facility];  Service: Orthopedics;  Laterality: Left;  axillary block   Patient Active Problem List   Diagnosis Date Noted   Guaiac positive stools 10/24/2016   Precordial pain 12/30/2012   HTN (hypertension)    Diabetes mellitus type 2, noninsulin dependent (HCC)    Hyperlipidemia    Morbid obesity (HCC)    CKD (chronic kidney disease) stage 3, GFR 30-59 ml/min (HCC)    Anginal pain (HCC) 12/29/2012    PCP: Corrington, Meredith Mody, MD  REFERRING PROVIDER: Nyra Capes, MD   REFERRING DIAG: Unilateral primary osteoarthritis, right knee   THERAPY DIAG:  Acute pain of right knee  Stiffness of right knee, not elsewhere classified  Localized edema  Rationale for Evaluation and Treatment: Rehabilitation  ONSET DATE: 09/09/22  SUBJECTIVE:   SUBJECTIVE STATEMENT: Patient reports that his knee is hurting a little more today.   PERTINENT HISTORY: Hypertension, diabetes, chronic kidney disease, obesity, and osteoarthritis PAIN:  Are you having pain? Yes: NPRS scale: 3/10 Pain location: right thigh Pain description: sore Aggravating factors: bending his knee, straight leg raise Relieving factors: ice  PRECAUTIONS: None  WEIGHT BEARING RESTRICTIONS: No  FALLS:  Has patient fallen in last 6 months? No  LIVING ENVIRONMENT: Lives with: lives alone Lives in: House/apartment Stairs: Yes: External: 2 steps; none Has following  equipment at home: Dan Humphreys - 2 wheeled  OCCUPATION: disabled   PLOF: Independent  PATIENT GOALS: reduced pain, improved mobility (he was walking with a walker prior to surgery), and be able to walk without a walker  NEXT MD VISIT: 09/24/22  OBJECTIVE:   PATIENT SURVEYS:  FOTO 45.26  COGNITION: Overall cognitive status: Within functional limits for tasks assessed     SENSATION: Patient reports mild tingling around his  knee  EDEMA:  Moderate right knee edema observed with no redness or bruising  PALPATION: TTP: Right gastroc  LOWER EXTREMITY ROM:  Active ROM Right eval Left eval  Hip flexion    Hip extension    Hip abduction    Hip adduction    Hip internal rotation    Hip external rotation    Knee flexion 109/112 (PROM) 141  Knee extension 5 0  Ankle dorsiflexion    Ankle plantarflexion    Ankle inversion    Ankle eversion     (Blank rows = not tested)  LOWER EXTREMITY MMT: not tested due to surgical condition  GAIT: Assistive device utilized: Environmental consultant - 2 wheeled Level of assistance: Modified independence Comments: Step through pattern with decreased gait speed and stride length   TODAY'S TREATMENT:                                                                                                                              DATE:                                     12/18 EXERCISE LOG  Exercise Repetitions and Resistance Comments  Nustep L4 x 15 minutes; seat 11   Marching  20 reps each  With 1 HHA   Step up  6" step x 2 minutes   LAQ 5# x 3 minutes Alternating LE   Rocker board  3 minutes   Seated HS curl Blue t-band x 20 reps    SLR  25 reps   Tyrail stretch 4 x 30 seconds    Blank cell = exercise not performed today   PATIENT EDUCATION:  Education details: Plan of care, HEP, prognosis, healing, and goals for therapy Person educated: Patient Education method: Explanation Education comprehension: verbalized understanding  HOME EXERCISE PROGRAM: Patient was encouraged to continue with the HEP provided by his home health therapist  ASSESSMENT:  CLINICAL IMPRESSION: Patient was introduced to multiple new interventions for improved lower extremity mobility and strength. He required minimal cueing with a Arliss stretch for proper positioning to facilitate improved quadriceps soft tissue extensibility. He was able to demonstrate 119 degrees of active right knee flexion after  today's seated and standing interventions. He reported that his knee felt alright upon the conclusion of treatment. He continues to require skilled physical therapy to address his remaining impairments to return to his prior level of function.  OBJECTIVE IMPAIRMENTS: Abnormal gait, decreased activity tolerance, decreased mobility, difficulty walking, decreased ROM, decreased strength, hypomobility, increased edema, and pain.   ACTIVITY LIMITATIONS: carrying, lifting, standing, squatting, stairs, transfers, dressing, and locomotion level  PARTICIPATION LIMITATIONS: meal prep, cleaning, laundry, and community activity  PERSONAL FACTORS: 3+ comorbidities: Hypertension, diabetes, chronic kidney disease, obesity, and osteoarthritis  are also affecting patient's functional outcome.   REHAB POTENTIAL: Good  CLINICAL DECISION MAKING: Stable/uncomplicated  EVALUATION COMPLEXITY: Low   GOALS: Goals reviewed with patient? Yes  LONG TERM GOALS: Target date: 10/21/22  Patient will be independent with his HEP. Baseline:  Goal status: IN PROGRESS  2.  Patient will be able to demonstrate at least 120 degrees of right knee flexion. Baseline: 119 degrees  Goal status: IN PROGRESS  3.  Patient will be able to safely ambulate at least 80 feet with a single-point cane or the least restrictive assistive device for improved household and community mobility. Baseline:  Goal status: IN PROGRESS  4.  Patient will be able to complete his daily activities without his familiar pain exceeding 5/10. Baseline:  Goal status: INITIAL   PLAN:  PT FREQUENCY: 2x/week  PT DURATION: 4 weeks  PLANNED INTERVENTIONS: Therapeutic exercises, Therapeutic activity, Neuromuscular re-education, Balance training, Gait training, Patient/Family education, Self Care, Joint mobilization, Stair training, Electrical stimulation, Cryotherapy, Moist heat, Vasopneumatic device, Manual therapy, and Re-evaluation  PLAN FOR NEXT  SESSION: NuStep, lower extremity strengthening, manual therapy, and modalities as needed   Granville Lewis, PT 09/30/2022, 12:55 PM

## 2022-10-03 ENCOUNTER — Ambulatory Visit: Payer: Medicare Other

## 2022-10-03 DIAGNOSIS — R600 Localized edema: Secondary | ICD-10-CM

## 2022-10-03 DIAGNOSIS — M25661 Stiffness of right knee, not elsewhere classified: Secondary | ICD-10-CM

## 2022-10-03 DIAGNOSIS — M25561 Pain in right knee: Secondary | ICD-10-CM

## 2022-10-03 NOTE — Therapy (Signed)
OUTPATIENT PHYSICAL THERAPY LOWER EXTREMITY TREATMENT   Patient Name: [name] MRN: [id number] DOB:[birth year], 61 y.o., male Today's Date: 10/03/2022  END OF SESSION:  PT End of Session - 10/03/22 1303     Visit Number 4    Number of Visits 8    Date for PT Re-Evaluation 12/06/22    PT Start Time 1300    Activity Tolerance Patient tolerated treatment well    Behavior During Therapy [facility] for tasks assessed/performed             Past Medical History:  Diagnosis Date   Anginal pain (HCC) 12/29/2012   CKD (chronic kidney disease) stage 3, GFR 30-59 ml/min (HCC)    Diabetes mellitus type 2, noninsulin dependent (HCC)    GERD (gastroesophageal reflux disease)    H/O hiatal hernia    Headache(784.0)    "not daily, not weekly, but very often" (12/30/2012)   HTN (hypertension)    Hyperlipidemia    Morbid obesity (HCC)    Osteoarthritis    "hands" (12/30/2012)   Past Surgical History:  Procedure Laterality Date   CARPAL TUNNEL RELEASE Left 01/08/2018   Procedure: LEFT CARPAL TUNNEL RELEASE;  Surgeon: Cindee Salt, MD;  Location: [facility];  Service: Orthopedics;  Laterality: Left;  axillary block   ELBOW ARTHROPLASTY Left 1990's   "3 spurs; ?14 staples" (12/30/2012)   KNEE ARTHROSCOPY WITH SUBCHONDROPLASTY Right 04/18/2021   Procedure: KNEE ARTHROSCOPY WITH PARTIAL MEDIAL AND LATERAL MENISCECTOMY AND DEBRIDEMENT,MICROFRACTURE medial tibial plateau;  Surgeon: Jene Every, MD;  Location: [facility];  Service: Orthopedics;  Laterality: Right;    LEFT HEART CATHETERIZATION WITH CORONARY ANGIOGRAM N/A 12/31/2012   Procedure: LEFT HEART CATHETERIZATION WITH CORONARY ANGIOGRAM;  Surgeon: Tonny Bollman, MD;  Location: [facility] CATH LAB;  Service: Cardiovascular;  Laterality: N/A;   LUMBAR DISC SURGERY  2010; 2011   "2 ruptured discs; 2 pinched nerves"   SHOULDER SURGERY Right    ULNAR NERVE TRANSPOSITION Left 01/08/2018   Procedure: DECOMPRESSION LEFT ULNAR AT ELBOW;   Surgeon: Cindee Salt, MD;  Location:  SURGERY CENTER;  Service: Orthopedics;  Laterality: Left;  axillary block   Patient Active Problem List   Diagnosis Date Noted   Guaiac positive stools 10/24/2016   Precordial pain 12/30/2012   HTN (hypertension)    Diabetes mellitus type 2, noninsulin dependent (HCC)    Hyperlipidemia    Morbid obesity (HCC)    CKD (chronic kidney disease) stage 3, GFR 30-59 ml/min (HCC)    Anginal pain (HCC) 12/29/2012    PCP: Corrington, Meredith Mody, MD  REFERRING PROVIDER: Nyra Capes, MD   REFERRING DIAG: Unilateral primary osteoarthritis, right knee   THERAPY DIAG:  Acute pain of right knee  Stiffness of right knee, not elsewhere classified  Localized edema  Rationale for Evaluation and Treatment: Rehabilitation  ONSET DATE: 09/09/22  SUBJECTIVE:   SUBJECTIVE STATEMENT: Patient reports that his knee is hurting a little bit today.   PERTINENT HISTORY: Hypertension, diabetes, chronic kidney disease, obesity, and osteoarthritis PAIN:  Are you having pain? Yes: NPRS scale: 2/10 Pain location: right thigh Pain description: sore Aggravating factors: bending his knee, straight leg raise Relieving factors: ice  PRECAUTIONS: None  WEIGHT BEARING RESTRICTIONS: No  FALLS:  Has patient fallen in last 6 months? No  LIVING ENVIRONMENT: Lives with: lives alone Lives in: House/apartment Stairs: Yes: External: 2 steps; none Has following equipment at home: Walker - 2 wheeled  OCCUPATION: disabled   PLOF: Independent  PATIENT  GOALS: reduced pain, improved mobility (he was walking with a walker prior to surgery), and be able to walk without a walker  NEXT MD VISIT: 09/24/22  OBJECTIVE:   PATIENT SURVEYS:  FOTO 45.26  COGNITION: Overall cognitive status: Within functional limits for tasks assessed     SENSATION: Patient reports mild tingling around his knee  EDEMA:  Moderate right knee edema observed with no redness or  bruising  PALPATION: TTP: Right gastroc  LOWER EXTREMITY ROM:  Active ROM Right eval Left eval  Hip flexion    Hip extension    Hip abduction    Hip adduction    Hip internal rotation    Hip external rotation    Knee flexion 109/112 (PROM) 141  Knee extension 5 0  Ankle dorsiflexion    Ankle plantarflexion    Ankle inversion    Ankle eversion     (Blank rows = not tested)  LOWER EXTREMITY MMT: not tested due to surgical condition  GAIT: Assistive device utilized: Environmental consultant - 2 wheeled Level of assistance: Modified independence Comments: Step through pattern with decreased gait speed and stride length   TODAY'S TREATMENT:                                                                                                                              DATE:                                     12/20 EXERCISE LOG  Exercise Repetitions and Resistance Comments  Nustep L4 x 15 minutes; seat 11   Marching  20 reps each  With 1 HHA   Step up  6" step x 25 reps   LAQ 5# x 3 minutes Alternating LE   Rocker board  3 minutes   Seated HS curl Blue t-band x 20 reps    SLR  25 reps   Majid stretch     Blank cell = exercise not performed today   Manual Therapy Soft Tissue Mobilization: right quad, STW/M to right quad to decrease pain and tone with pt in supine for comfort  PATIENT EDUCATION:  Education details: Plan of care, HEP, prognosis, healing, and goals for therapy Person educated: Patient Education method: Explanation Education comprehension: verbalized understanding  HOME EXERCISE PROGRAM: Patient was encouraged to continue with the HEP provided by his home health therapist  ASSESSMENT:  CLINICAL IMPRESSION: Pt arrives for today's treatment session reporting 2/10 right knee/quad pain.  Pt able to tolerate increased reps with various exercises today without issue.  Pt reports fatigue and mild increase in pain with LAQ.  STW/M performed to right quad to decrease pain and  tone with pt in supine for comfort.  Pt reported decreased pain at completion of today's treatment session.   OBJECTIVE IMPAIRMENTS: Abnormal gait, decreased activity tolerance, decreased mobility, difficulty walking, decreased ROM, decreased strength,  hypomobility, increased edema, and pain.   ACTIVITY LIMITATIONS: carrying, lifting, standing, squatting, stairs, transfers, dressing, and locomotion level  PARTICIPATION LIMITATIONS: meal prep, cleaning, laundry, and community activity  PERSONAL FACTORS: 3+ comorbidities: Hypertension, diabetes, chronic kidney disease, obesity, and osteoarthritis  are also affecting patient's functional outcome.   REHAB POTENTIAL: Good  CLINICAL DECISION MAKING: Stable/uncomplicated  EVALUATION COMPLEXITY: Low   GOALS: Goals reviewed with patient? Yes  LONG TERM GOALS: Target date: 10/21/22  Patient will be independent with his HEP. Baseline:  Goal status: IN PROGRESS  2.  Patient will be able to demonstrate at least 120 degrees of right knee flexion. Baseline: 119 degrees  Goal status: IN PROGRESS  3.  Patient will be able to safely ambulate at least 80 feet with a single-point cane or the least restrictive assistive device for improved household and community mobility. Baseline:  Goal status: IN PROGRESS  4.  Patient will be able to complete his daily activities without his familiar pain exceeding 5/10. Baseline:  Goal status: INITIAL   PLAN:  PT FREQUENCY: 2x/week  PT DURATION: 4 weeks  PLANNED INTERVENTIONS: Therapeutic exercises, Therapeutic activity, Neuromuscular re-education, Balance training, Gait training, Patient/Family education, Self Care, Joint mobilization, Stair training, Electrical stimulation, Cryotherapy, Moist heat, Vasopneumatic device, Manual therapy, and Re-evaluation  PLAN FOR NEXT SESSION: NuStep, lower extremity strengthening, manual therapy, and modalities as needed   Newman Pies, PTA 10/03/2022, 4:34 PM

## 2022-10-08 ENCOUNTER — Encounter: Payer: Self-pay | Admitting: Physical Therapy

## 2022-10-08 ENCOUNTER — Ambulatory Visit: Payer: Medicare Other | Admitting: Physical Therapy

## 2022-10-08 DIAGNOSIS — M25561 Pain in right knee: Secondary | ICD-10-CM

## 2022-10-08 DIAGNOSIS — R600 Localized edema: Secondary | ICD-10-CM

## 2022-10-08 DIAGNOSIS — M25661 Stiffness of right knee, not elsewhere classified: Secondary | ICD-10-CM

## 2022-10-08 DIAGNOSIS — M6281 Muscle weakness (generalized): Secondary | ICD-10-CM

## 2022-10-08 NOTE — Therapy (Signed)
OUTPATIENT PHYSICAL THERAPY LOWER EXTREMITY TREATMENT   Patient Name: [name] MRN: [id number] DOB:1961/07/10, 61 y.o., male Today's Date: 10/08/2022  END OF SESSION:  PT End of Session - 10/08/22 0947     Visit Number 5    Number of Visits 8    Date for PT Re-Evaluation 12/06/22    PT Start Time 0948    PT Stop Time 2703    PT Time Calculation (min) 44 min    Activity Tolerance Patient tolerated treatment well    Behavior During Therapy [facility] for tasks assessed/performed            Past Medical History:  Diagnosis Date   Anginal pain ([place]) 12/29/2012   CKD (chronic kidney disease) stage 3, GFR 30-59 ml/min (HCC)    Diabetes mellitus type 2, noninsulin dependent (HCC)    GERD (gastroesophageal reflux disease)    H/O hiatal hernia    Headache(784.0)    "not daily, not weekly, but very often" (12/30/2012)   HTN (hypertension)    Hyperlipidemia    Morbid obesity ([place])    Osteoarthritis    "hands" (12/30/2012)   Past Surgical History:  Procedure Laterality Date   CARPAL TUNNEL RELEASE Left 01/08/2018   Procedure: LEFT CARPAL TUNNEL RELEASE;  Surgeon: Daryll Brod, MD;  Location: [place];  Service: Orthopedics;  Laterality: Left;  axillary block   ELBOW ARTHROPLASTY Left 1990's   "3 spurs; ?14 staples" (12/30/2012)   KNEE ARTHROSCOPY WITH SUBCHONDROPLASTY Right 04/18/2021   Procedure: KNEE ARTHROSCOPY WITH PARTIAL MEDIAL AND LATERAL MENISCECTOMY AND DEBRIDEMENT,MICROFRACTURE medial tibial plateau;  Surgeon: Susa Day, MD;  Location: [facility];  Service: Orthopedics;  Laterality: Right;  60MIN   LEFT HEART CATHETERIZATION WITH CORONARY ANGIOGRAM N/A 12/31/2012   Procedure: LEFT HEART CATHETERIZATION WITH CORONARY ANGIOGRAM;  Surgeon: Sherren Mocha, MD;  Location: [facility] CATH LAB;  Service: Cardiovascular;  Laterality: N/A;   LUMBAR [place] SURGERY  2010; 2011   "2 ruptured discs; 2 pinched nerves"   SHOULDER SURGERY Right    ULNAR NERVE TRANSPOSITION Left  01/08/2018   Procedure: DECOMPRESSION LEFT ULNAR AT ELBOW;  Surgeon: Daryll Brod, MD;  Location: [place];  Service: Orthopedics;  Laterality: Left;  axillary block   Patient Active Problem List   Diagnosis Date Noted   Guaiac positive stools 10/24/2016   Precordial pain 12/30/2012   HTN (hypertension)    Diabetes mellitus type 2, noninsulin dependent (HCC)    Hyperlipidemia    Morbid obesity (HCC)    CKD (chronic kidney disease) stage 3, GFR 30-59 ml/min (HCC)    Anginal pain ([place]) 12/29/2012   PCP: Corrington, Delsa Grana, MD  REFERRING PROVIDER: Einar Crow, MD   REFERRING DIAG: Unilateral primary osteoarthritis, right knee   THERAPY DIAG:  Acute pain of right knee  Stiffness of right knee, not elsewhere classified  Localized edema  Muscle weakness (generalized)  Rationale for Evaluation and Treatment: Rehabilitation  ONSET DATE: 09/09/22  SUBJECTIVE:   SUBJECTIVE STATEMENT: Reports more pain today.  PERTINENT HISTORY: Hypertension, diabetes, chronic kidney disease, obesity, and osteoarthritis PAIN:  Are you having pain? Yes: NPRS scale: 4/10 Pain location: right thigh Pain description: sore Aggravating factors: bending his knee, straight leg raise Relieving factors: ice  PRECAUTIONS: None  PATIENT GOALS: reduced pain, improved mobility (he was walking with a walker prior to surgery), and be able to walk without a walker  NEXT MD VISIT: 09/24/22  OBJECTIVE:   PATIENT SURVEYS:  FOTO 45.26  LOWER  EXTREMITY ROM:  Active ROM Right eval Left eval Right 12/26  Hip flexion     Hip extension     Hip abduction     Hip adduction     Hip internal rotation     Hip external rotation     Knee flexion 109/112 (PROM) 141 126  Knee extension 5 0 0  Ankle dorsiflexion     Ankle plantarflexion     Ankle inversion     Ankle eversion      (Blank rows = not tested)  TODAY'S TREATMENT:                                                                                                                               DATE:                                    12/26 EXERCISE LOG  Exercise Repetitions and Resistance Comments  Nustep L4 x 18 minutes; seat 13-11   LAQ 5# x 3 minutes Alternating LE   Seated HS curl Blue t-band x 20 reps    Heel prop into ext X1 min   Heel slides X10 reps    Blank cell = exercise not performed today   Modalities  Date: 12/26 Vaso: Knee, Low, 15 mins, Pain and Edema  PATIENT EDUCATION:  Education details: Plan of care, HEP, prognosis, healing, and goals for therapy Person educated: Patient Education method: Explanation Education comprehension: verbalized understanding  HOME EXERCISE PROGRAM: Patient was encouraged to continue with the HEP provided by his home health therapist  ASSESSMENT:  CLINICAL IMPRESSION: Patient presented in clinic with reports of continued muscle soreness and discomfort. Patient able to tolerate light therex with no complaints of pain. Patient had notable swelling of the R knee today as well and stated that he has been icing at home. AROM measurments listed in today's note. Normal vasopneumatic response noted following removal of the modality.  OBJECTIVE IMPAIRMENTS: Abnormal gait, decreased activity tolerance, decreased mobility, difficulty walking, decreased ROM, decreased strength, hypomobility, increased edema, and pain.   ACTIVITY LIMITATIONS: carrying, lifting, standing, squatting, stairs, transfers, dressing, and locomotion level  PARTICIPATION LIMITATIONS: meal prep, cleaning, laundry, and community activity  PERSONAL FACTORS: 3+ comorbidities: Hypertension, diabetes, chronic kidney disease, obesity, and osteoarthritis  are also affecting patient's functional outcome.   REHAB POTENTIAL: Good  CLINICAL DECISION MAKING: Stable/uncomplicated  EVALUATION COMPLEXITY: Low  GOALS: Goals reviewed with patient? Yes  LONG TERM GOALS: Target date: 10/21/22  Patient  will be independent with his HEP. Baseline:  Goal status: IN PROGRESS  2.  Patient will be able to demonstrate at least 120 degrees of right knee flexion. Baseline: 119 degrees  Goal status: MET  3.  Patient will be able to safely ambulate at least 80 feet with a single-point cane or the least restrictive assistive device for improved household and  community mobility. Baseline:  Goal status: IN PROGRESS  4.  Patient will be able to complete his daily activities without his familiar pain exceeding 5/10. Baseline:  Goal status: IN PROGRESS  PLAN:  PT FREQUENCY: 2x/week  PT DURATION: 4 weeks  PLANNED INTERVENTIONS: Therapeutic exercises, Therapeutic activity, Neuromuscular re-education, Balance training, Gait training, Patient/Family education, Self Care, Joint mobilization, Stair training, Electrical stimulation, Cryotherapy, Moist heat, Vasopneumatic device, Manual therapy, and Re-evaluation  PLAN FOR NEXT SESSION: NuStep, lower extremity strengthening, manual therapy, and modalities as needed  Standley Brooking, PTA 10/08/2022, 10:56 AM

## 2022-10-10 ENCOUNTER — Ambulatory Visit: Payer: Medicare Other

## 2022-10-10 DIAGNOSIS — M25561 Pain in right knee: Secondary | ICD-10-CM

## 2022-10-10 DIAGNOSIS — R600 Localized edema: Secondary | ICD-10-CM

## 2022-10-10 DIAGNOSIS — M25661 Stiffness of right knee, not elsewhere classified: Secondary | ICD-10-CM

## 2022-10-10 NOTE — Therapy (Signed)
OUTPATIENT PHYSICAL THERAPY LOWER EXTREMITY TREATMENT   Patient Name: [name] MRN: [id number] DOB:[birth year], 61 y.o., male Today's Date: 10/10/2022  END OF SESSION:  PT End of Session - 10/10/22 0948     Visit Number 6    Number of Visits 8    Date for PT Re-Evaluation 12/06/22    PT Start Time 0945    PT Stop Time 1030    PT Time Calculation (min) 45 min    Activity Tolerance Patient tolerated treatment well    Behavior During Therapy [facility] for tasks assessed/performed            Past Medical History:  Diagnosis Date   Anginal pain ([place]) 12/29/2012   CKD (chronic kidney disease) stage 3, GFR 30-59 ml/min (HCC)    Diabetes mellitus type 2, noninsulin dependent (HCC)    GERD (gastroesophageal reflux disease)    H/O hiatal hernia    Headache(784.0)    "not daily, not weekly, but very often" (12/30/2012)   HTN (hypertension)    Hyperlipidemia    Morbid obesity ([place])    Osteoarthritis    "hands" (12/30/2012)   Past Surgical History:  Procedure Laterality Date   CARPAL TUNNEL RELEASE Left 01/08/2018   Procedure: LEFT CARPAL TUNNEL RELEASE;  Surgeon: Daryll Brod, MD;  Location: [place];  Service: Orthopedics;  Laterality: Left;  axillary block   ELBOW ARTHROPLASTY Left 1990's   "3 spurs; ?14 staples" (12/30/2012)   KNEE ARTHROSCOPY WITH SUBCHONDROPLASTY Right 04/18/2021   Procedure: KNEE ARTHROSCOPY WITH PARTIAL MEDIAL AND LATERAL MENISCECTOMY AND DEBRIDEMENT,MICROFRACTURE medial tibial plateau;  Surgeon: Susa Day, MD;  [facility];  Service: Orthopedics;  Laterality: Right;  60MIN   LEFT HEART CATHETERIZATION WITH CORONARY ANGIOGRAM N/A 12/31/2012   Procedure: LEFT HEART CATHETERIZATION WITH CORONARY ANGIOGRAM;  Surgeon: Sherren Mocha, MD;  Location: [facility] C[facility] LAB;  Service: Cardiovascular;  Laterality: N/A;   LUMBAR [place] SURGERY  2010; 2011   "2 ruptured discs; 2 pinched nerves"   SHOULDER SURGERY Right    ULNAR NERVE TRANSPOSITION Left  01/08/2018   Procedure: DECOMPRESSION LEFT ULNAR AT ELBOW;  Surgeon: Daryll Brod, MD;  Location: [place];  Service: Orthopedics;  Laterality: Left;  axillary block   Patient Active Problem List   Diagnosis Date Noted   Guaiac positive stools 10/24/2016   Precordial pain 12/30/2012   HTN (hypertension)    Diabetes mellitus type 2, noninsulin dependent (HCC)    Hyperlipidemia    Morbid obesity (HCC)    CKD (chronic kidney disease) stage 3, GFR 30-59 ml/min (HCC)    Anginal pain ([place]) 12/29/2012   PCP: Corrington, Delsa Grana, MD  REFERRING PROVIDER: Einar Crow, MD   REFERRING DIAG: Unilateral primary osteoarthritis, right knee   THERAPY DIAG:  Acute pain of right knee  Stiffness of right knee, not elsewhere classified  Localized edema  Rationale for Evaluation and Treatment: Rehabilitation  ONSET DATE: 09/09/22  SUBJECTIVE:   SUBJECTIVE STATEMENT: Pt reports minimal right quad soreness today. -1=  PERTINENT HISTORY: Hypertension, diabetes, chronic kidney disease, obesity, and osteoarthritis PAIN:  Are you having pain? Yes: NPRS scale: 2/10 Pain location: right thigh Pain description: sore Aggravating factors: bending his knee, straight leg raise Relieving factors: ice  PRECAUTIONS: None  PATIENT GOALS: reduced pain, improved mobility (he was walking with a walker prior to surgery), and be able to walk without a walker  NEXT MD VISIT: 09/24/22  OBJECTIVE:   PATIENT SURVEYS:  FOTO 45.26  LOWER  EXTREMITY ROM:  Active ROM Right eval Left eval Right 12/26  Hip flexion     Hip extension     Hip abduction     Hip adduction     Hip internal rotation     Hip external rotation     Knee flexion 109/112 (PROM) 141 126  Knee extension 5 0 0  Ankle dorsiflexion     Ankle plantarflexion     Ankle inversion     Ankle eversion      (Blank rows = not tested)  TODAY'S TREATMENT:                                                                                                                               DATE:                                    12/28 EXERCISE LOG  Exercise Repetitions and Resistance Comments  Nustep L4 x 18 minutes; seat 11-10   LAQ 5# x 3 minutes Alternating LE   Seated Marches 5# x 2 mins   Seated HS curl Blue t-band x 20 reps    Heel prop into ext X1 min   Heel slides X10 reps    Blank cell = exercise not performed today   PATIENT EDUCATION:  Education details: Plan of care, HEP, prognosis, healing, and goals for therapy Person educated: Patient Education method: Explanation Education comprehension: verbalized understanding  HOME EXERCISE PROGRAM: Patient was encouraged to continue with the HEP provided by his home health therapist  ASSESSMENT:  CLINICAL IMPRESSION: Pt arrives for today's treatment session 2/10 right quad pain.  Pt able to ambulate throughout gym with [facility] and without AD.  Pt with mild antalgic gait with SPC that increased without AD.  STW/M performed to right distal quad to decrease pain and tone.  Pt opted to perform ice at home today.  Pt reported 1/10 right quad pain at completion of today's treatment session.  OBJECTIVE IMPAIRMENTS: Abnormal gait, decreased activity tolerance, decreased mobility, difficulty walking, decreased ROM, decreased strength, hypomobility, increased edema, and pain.   ACTIVITY LIMITATIONS: carrying, lifting, standing, squatting, stairs, transfers, dressing, and locomotion level  PARTICIPATION LIMITATIONS: meal prep, cleaning, laundry, and community activity  PERSONAL FACTORS: 3+ comorbidities: Hypertension, diabetes, chronic kidney disease, obesity, and osteoarthritis  are also affecting patient's functional outcome.   REHAB POTENTIAL: Good  CLINICAL DECISION MAKING: Stable/uncomplicated  EVALUATION COMPLEXITY: Low  GOALS: Goals reviewed with patient? Yes  LONG TERM GOALS: Target date: 10/21/22  Patient will be independent with his HEP. Baseline:   Goal status: IN PROGRESS  2.  Patient will be able to demonstrate at least 120 degrees of right knee flexion. Baseline: 119 degrees  Goal status: MET  3.  Patient will be able to safely ambulate at least 80 feet with a single-point cane or the least restrictive assistive  device for improved household and community mobility. Baseline:  Goal status: MET  4.  Patient will be able to complete his daily activities without his familiar pain exceeding 5/10. Baseline:  Goal status: MET  PLAN:  PT FREQUENCY: 2x/week  PT DURATION: 4 weeks  PLANNED INTERVENTIONS: Therapeutic exercises, Therapeutic activity, Neuromuscular re-education, Balance training, Gait training, Patient/Family education, Self Care, Joint mobilization, Stair training, Electrical stimulation, Cryotherapy, Moist heat, Vasopneumatic device, Manual therapy, and Re-evaluation  PLAN FOR NEXT SESSION: NuStep, lower extremity strengthening, manual therapy, and modalities as needed  Kathrynn Ducking, PTA 10/10/2022, 10:42 AM

## 2022-10-15 ENCOUNTER — Ambulatory Visit: Payer: Medicare Other | Admitting: Physical Therapy

## 2022-10-16 ENCOUNTER — Ambulatory Visit: Payer: Medicare Other | Attending: Orthopaedic Surgery

## 2022-10-16 DIAGNOSIS — M25561 Pain in right knee: Secondary | ICD-10-CM | POA: Diagnosis present

## 2022-10-16 DIAGNOSIS — M25661 Stiffness of right knee, not elsewhere classified: Secondary | ICD-10-CM | POA: Insufficient documentation

## 2022-10-16 DIAGNOSIS — M6281 Muscle weakness (generalized): Secondary | ICD-10-CM | POA: Diagnosis present

## 2022-10-16 DIAGNOSIS — R600 Localized edema: Secondary | ICD-10-CM | POA: Diagnosis present

## 2022-10-16 NOTE — Therapy (Signed)
OUTPATIENT PHYSICAL THERAPY LOWER EXTREMITY TREATMENT   Patient Name: [name] MRN: [id number] DOB:[birth year], 62 y.o., male Today's Date: 10/16/2022  END OF SESSION:  PT End of Session - 10/16/22 1347     Visit Number 7    Number of Visits 8    Date for PT Re-Evaluation 12/06/22    PT Start Time 1345    PT Stop Time 1427    PT Time Calculation (min) 42 min    Activity Tolerance Patient tolerated treatment well    Behavior During Therapy [facility] for tasks assessed/performed            Past Medical History:  Diagnosis Date   Anginal pain ([place]) 12/29/2012   CKD (chronic kidney disease) stage 3, GFR 30-59 ml/min (HCC)    Diabetes mellitus type 2, noninsulin dependent (HCC)    GERD (gastroesophageal reflux disease)    H/O hiatal hernia    Headache(784.0)    "not daily, not weekly, but very often" (12/30/2012)   HTN (hypertension)    Hyperlipidemia    Morbid obesity ([place])    Osteoarthritis    "hands" (12/30/2012)   Past Surgical History:  Procedure Laterality Date   CARPAL TUNNEL RELEASE Left 01/08/2018   Procedure: LEFT CARPAL TUNNEL RELEASE;  Surgeon: Daryll Brod, MD;  Location: [place];  Service: Orthopedics;  Laterality: Left;  axillary block   ELBOW ARTHROPLASTY Left 1990's   "3 spurs; ?14 staples" (12/30/2012)   KNEE ARTHROSCOPY WITH SUBCHONDROPLASTY Right 04/18/2021   Procedure: KNEE ARTHROSCOPY WITH PARTIAL MEDIAL AND LATERAL MENISCECTOMY AND DEBRIDEMENT,MICROFRACTURE medial tibial plateau;  Surgeon: Susa Day, MD;  Location: [facility];  Service: Orthopedics;  Laterality: Right;  60MIN   LEFT HEART CATHETERIZATION WITH CORONARY ANGIOGRAM N/A 12/31/2012   Procedure: LEFT HEART CATHETERIZATION WITH CORONARY ANGIOGRAM;  Surgeon: Sherren Mocha, MD;  Location: [facility] CATH LAB;  Service: Cardiovascular;  Laterality: N/A;   LUMBAR [place] SURGERY  2010; 2011   "2 ruptured discs; 2 pinched nerves"   SHOULDER SURGERY Right    ULNAR NERVE TRANSPOSITION Left  01/08/2018   Procedure: DECOMPRESSION LEFT ULNAR AT ELBOW;  Surgeon: Daryll Brod, MD;  Location: [place];  Service: Orthopedics;  Laterality: Left;  axillary block   Patient Active Problem List   Diagnosis Date Noted   Guaiac positive stools 10/24/2016   Precordial pain 12/30/2012   HTN (hypertension)    Diabetes mellitus type 2, noninsulin dependent (HCC)    Hyperlipidemia    Morbid obesity (HCC)    CKD (chronic kidney disease) stage 3, GFR 30-59 ml/min (HCC)    Anginal pain ([place]) 12/29/2012   PCP: Corrington, Delsa Grana, MD  REFERRING PROVIDER: Einar Crow, MD   REFERRING DIAG: Unilateral primary osteoarthritis, right knee   THERAPY DIAG:  Acute pain of right knee  Stiffness of right knee, not elsewhere classified  Localized edema  Rationale for Evaluation and Treatment: Rehabilitation  ONSET DATE: 09/09/22  SUBJECTIVE:   SUBJECTIVE STATEMENT: Pt reports minimal right quad soreness today. -1=  PERTINENT HISTORY: Hypertension, diabetes, chronic kidney disease, obesity, and osteoarthritis PAIN:  Are you having pain? Yes: NPRS scale: 2/10 Pain location: right thigh Pain description: sore Aggravating factors: bending his knee, straight leg raise Relieving factors: ice  PRECAUTIONS: None  PATIENT GOALS: reduced pain, improved mobility (he was walking with a walker prior to surgery), and be able to walk without a walker  NEXT MD VISIT: 09/24/22  OBJECTIVE:   PATIENT SURVEYS:  FOTO 45.26  LOWER  EXTREMITY ROM:  Active ROM Right eval Left eval Right 12/26  Hip flexion     Hip extension     Hip abduction     Hip adduction     Hip internal rotation     Hip external rotation     Knee flexion 109/112 (PROM) 141 126  Knee extension 5 0 0  Ankle dorsiflexion     Ankle plantarflexion     Ankle inversion     Ankle eversion      (Blank rows = not tested)  TODAY'S TREATMENT:                                                                                                                               DATE:                                    1/3 EXERCISE LOG  Exercise Repetitions and Resistance Comments  Nustep L4 x 18 minutes; seat 11-10   Cybex Knee Flexion 40# x 25 reps Alternating LE   Cybex Knee Extension 10# x 25 reps   Rockerboard X3 mins   Forward Step Ups 6" x25 reps   Seated Marches 5# x 3 mins   Seated HS curl Blue t-band x 20 reps    Heel prop into ext X1 min   Heel slides X10 reps    Blank cell = exercise not performed today   PATIENT EDUCATION:  Education details: Plan of care, HEP, prognosis, healing, and goals for therapy Person educated: Patient Education method: Explanation Education comprehension: verbalized understanding  HOME EXERCISE PROGRAM: Patient was encouraged to continue with the HEP provided by his home health therapist  ASSESSMENT:  CLINICAL IMPRESSION: Pt arrives for today's treatment session reporting 2/10 right quad pain. Pt introduced to cybex knee flexion and extension today with pt requiring min cues for eccentric control.  Pt able to perform forward step ups on 6 inch step without issue or discomfort.  Pt requiring min cues to avoid pulling with BUEs.  Pt denied any pain upon completion of today's treatment session.  OBJECTIVE IMPAIRMENTS: Abnormal gait, decreased activity tolerance, decreased mobility, difficulty walking, decreased ROM, decreased strength, hypomobility, increased edema, and pain.   ACTIVITY LIMITATIONS: carrying, lifting, standing, squatting, stairs, transfers, dressing, and locomotion level  PARTICIPATION LIMITATIONS: meal prep, cleaning, laundry, and community activity  PERSONAL FACTORS: 3+ comorbidities: Hypertension, diabetes, chronic kidney disease, obesity, and osteoarthritis  are also affecting patient's functional outcome.   REHAB POTENTIAL: Good  CLINICAL DECISION MAKING: Stable/uncomplicated  EVALUATION COMPLEXITY: Low  GOALS: Goals reviewed  with patient? Yes  LONG TERM GOALS: Target date: 10/21/22  Patient will be independent with his HEP. Baseline:  Goal status: IN PROGRESS  2.  Patient will be able to demonstrate at least 120 degrees of right knee flexion. Baseline: 119 degrees  Goal status: MET  3.  Patient will be able to safely ambulate at least 80 feet with a single-point cane or the least restrictive assistive device for improved household and community mobility. Baseline:  Goal status: MET  4.  Patient will be able to complete his daily activities without his familiar pain exceeding 5/10. Baseline:  Goal status: MET  PLAN:  PT FREQUENCY: 2x/week  PT DURATION: 4 weeks  PLANNED INTERVENTIONS: Therapeutic exercises, Therapeutic activity, Neuromuscular re-education, Balance training, Gait training, Patient/Family education, Self Care, Joint mobilization, Stair training, Electrical stimulation, Cryotherapy, Moist heat, Vasopneumatic device, Manual therapy, and Re-evaluation  PLAN FOR NEXT SESSION: NuStep, lower extremity strengthening, manual therapy, and modalities as needed  Kathrynn Ducking, PTA 10/16/2022, 2:39 PM

## 2022-10-17 ENCOUNTER — Ambulatory Visit: Payer: Medicare Other

## 2022-10-17 DIAGNOSIS — M6281 Muscle weakness (generalized): Secondary | ICD-10-CM

## 2022-10-17 DIAGNOSIS — M25561 Pain in right knee: Secondary | ICD-10-CM | POA: Diagnosis not present

## 2022-10-17 DIAGNOSIS — R600 Localized edema: Secondary | ICD-10-CM

## 2022-10-17 DIAGNOSIS — M25661 Stiffness of right knee, not elsewhere classified: Secondary | ICD-10-CM

## 2022-10-17 NOTE — Therapy (Addendum)
OUTPATIENT PHYSICAL THERAPY LOWER EXTREMITY TREATMENT   Patient Name: [name] MRN: [id number] DOB:04/21/1961, 62 y.o., male Today's Date: 10/17/2022  END OF SESSION:  PT End of Session - 10/17/22 0948     Visit Number 8    Number of Visits 8    Date for PT Re-Evaluation 12/06/22    PT Start Time 0945    Activity Tolerance Patient tolerated treatment well    Behavior During Therapy [facility] for tasks assessed/performed            Past Medical History:  Diagnosis Date   Anginal pain ([place]) 12/29/2012   CKD (chronic kidney disease) stage 3, GFR 30-59 ml/min (HCC)    Diabetes mellitus type 2, noninsulin dependent (HCC)    GERD (gastroesophageal reflux disease)    H/O hiatal hernia    Headache(784.0)    "not daily, not weekly, but very often" (12/30/2012)   HTN (hypertension)    Hyperlipidemia    Morbid obesity ([place])    Osteoarthritis    "hands" (12/30/2012)   Past Surgical History:  Procedure Laterality Date   CARPAL TUNNEL RELEASE Left 01/08/2018   Procedure: LEFT CARPAL TUNNEL RELEASE;  Surgeon: Daryll Brod, MD;  Location: [place];  Service: Orthopedics;  Laterality: Left;  axillary block   ELBOW ARTHROPLASTY Left 1990's   "3 spurs; ?14 staples" (12/30/2012)   KNEE ARTHROSCOPY WITH SUBCHONDROPLASTY Right 04/18/2021   Procedure: KNEE ARTHROSCOPY WITH PARTIAL MEDIAL AND LATERAL MENISCECTOMY AND DEBRIDEMENT,MICROFRACTURE medial tibial plateau;  Surgeon: Susa Day, MD;  [facility];  Service: Orthopedics;  Laterality: Right;  60MIN   LEFT HEART CATHETERIZATION WITH CORONARY ANGIOGRAM N/A 12/31/2012   Procedure: LEFT HEART CATHETERIZATION WITH CORONARY ANGIOGRAM;  Surgeon: Sherren Mocha, MD;  Location: [facility] CATH LAB;  Service: Cardiovascular;  Laterality: N/A;   LUMBAR [place] SURGERY  2010; 2011   "2 ruptured discs; 2 pinched nerves"   SHOULDER SURGERY Right    ULNAR NERVE TRANSPOSITION Left 01/08/2018   Procedure: DECOMPRESSION LEFT ULNAR AT ELBOW;   Surgeon: Daryll Brod, MD;  Location: [place];  Service: Orthopedics;  Laterality: Left;  axillary block   Patient Active Problem List   Diagnosis Date Noted   Guaiac positive stools 10/24/2016   Precordial pain 12/30/2012   HTN (hypertension)    Diabetes mellitus type 2, noninsulin dependent (HCC)    Hyperlipidemia    Morbid obesity (HCC)    CKD (chronic kidney disease) stage 3, GFR 30-59 ml/min (HCC)    Anginal pain ([place]) 12/29/2012   PCP: Corrington, Delsa Grana, MD  REFERRING PROVIDER: Einar Crow, MD   REFERRING DIAG: Unilateral primary osteoarthritis, right knee   THERAPY DIAG:  Acute pain of right knee  Stiffness of right knee, not elsewhere classified  Localized edema  Muscle weakness (generalized)  Rationale for Evaluation and Treatment: Rehabilitation  ONSET DATE: 09/09/22  SUBJECTIVE:   SUBJECTIVE STATEMENT: Pt reports minimal quad soreness today.  Ready for discharge.  PERTINENT HISTORY: Hypertension, diabetes, chronic kidney disease, obesity, and osteoarthritis PAIN:  Are you having pain? Yes: NPRS scale: 2/10 Pain location: right thigh Pain description: sore Aggravating factors: bending his knee, straight leg raise Relieving factors: ice  PRECAUTIONS: None  PATIENT GOALS: reduced pain, improved mobility (he was walking with a walker prior to surgery), and be able to walk without a walker  NEXT MD VISIT: 09/24/22  OBJECTIVE:   PATIENT SURVEYS:  FOTO 45.26  LOWER EXTREMITY ROM:  Active ROM Right eval Left eval Right  12/26  Hip flexion     Hip extension     Hip abduction     Hip adduction     Hip internal rotation     Hip external rotation     Knee flexion 109/112 (PROM) 141 126  Knee extension 5 0 0  Ankle dorsiflexion     Ankle plantarflexion     Ankle inversion     Ankle eversion      (Blank rows = not tested)  TODAY'S TREATMENT:                                                                                                                               DATE:                                    1/4 EXERCISE LOG  Exercise Repetitions and Resistance Comments  Nustep L4 x 18 minutes; seat 11-10   Cybex Knee Flexion 40# x 50 reps Alternating LE   Cybex Knee Extension 10# x 50 reps   Cybex Leg Press 1 pl seat 10 x 25 reps   Rockerboard X4 mins   Forward Step Ups 8" x25 reps bil   Seated Marches 5# x 30 reps   Seated HS curl    Heel prop into ext    Heel slides     Blank cell = exercise not performed today   PATIENT EDUCATION:  Education details: Plan of care, HEP, prognosis, healing, and goals for therapy Person educated: Patient Education method: Explanation Education comprehension: verbalized understanding  HOME EXERCISE PROGRAM: Patient was encouraged to continue with the HEP provided by his home health therapist  ASSESSMENT:  CLINICAL IMPRESSION: Pt arrives for today's treatment session reporting 2/10 right distal quad pain.  Pt able to increase FOTO score to 67 today.  Pt has met all goals set forth for him by physical therapy at this time.  All questions encouraged and answered.  Pt instructed to call the facility with any questions or concerns.  Pt ready for discharge at this time. Pt denied any pain at completion of today's treatment session.  PHYSICAL THERAPY DISCHARGE SUMMARY  Visits from Start of Care: 8  Current functional level related to goals / functional outcomes: Patient was able to meet all of his goals for skilled physical therapy and felt comfortable being discharged at this time.    Remaining deficits: None    Education / Equipment: HEP   Patient agrees to discharge. Patient goals were met. Patient is being discharged due to meeting the stated rehab goals.  Jacqulynn Cadet, PT, DPT    OBJECTIVE IMPAIRMENTS: Abnormal gait, decreased activity tolerance, decreased mobility, difficulty walking, decreased ROM, decreased strength, hypomobility, increased edema, and  pain.   ACTIVITY LIMITATIONS: carrying, lifting, standing, squatting, stairs, transfers, dressing, and locomotion level  PARTICIPATION LIMITATIONS: meal prep, cleaning, laundry, and community  activity  PERSONAL FACTORS: 3+ comorbidities: Hypertension, diabetes, chronic kidney disease, obesity, and osteoarthritis  are also affecting patient's functional outcome.   REHAB POTENTIAL: Good  CLINICAL DECISION MAKING: Stable/uncomplicated  EVALUATION COMPLEXITY: Low  GOALS: Goals reviewed with patient? Yes  LONG TERM GOALS: Target date: 10/21/22  Patient will be independent with his HEP. Baseline:  Goal status: MET  2.  Patient will be able to demonstrate at least 120 degrees of right knee flexion. Baseline: 119 degrees  Goal status: MET  3.  Patient will be able to safely ambulate at least 80 feet with a single-point cane or the least restrictive assistive device for improved household and community mobility. Baseline:  Goal status: MET  4.  Patient will be able to complete his daily activities without his familiar pain exceeding 5/10. Baseline:  Goal status: MET  PLAN:  PT FREQUENCY: 2x/week  PT DURATION: 4 weeks  PLANNED INTERVENTIONS: Therapeutic exercises, Therapeutic activity, Neuromuscular re-education, Balance training, Gait training, Patient/Family education, Self Care, Joint mobilization, Stair training, Electrical stimulation, Cryotherapy, Moist heat, Vasopneumatic device, Manual therapy, and Re-evaluation  PLAN FOR NEXT SESSION: NuStep, lower extremity strengthening, manual therapy, and modalities as needed  Kathrynn Ducking, PTA 10/17/2022, 10:47 AM

## 2023-02-26 ENCOUNTER — Other Ambulatory Visit: Payer: Self-pay

## 2023-02-26 ENCOUNTER — Inpatient Hospital Stay (HOSPITAL_COMMUNITY)
Admission: EM | Admit: 2023-02-26 | Discharge: 2023-02-27 | DRG: 062 | Disposition: A | Discharge status: Home / Self Care | Payer: 59 | Attending: Neurology | Admitting: Neurology

## 2023-02-26 ENCOUNTER — Emergency Department (HOSPITAL_COMMUNITY): Payer: 59

## 2023-02-26 ENCOUNTER — Inpatient Hospital Stay (HOSPITAL_COMMUNITY): Payer: 59

## 2023-02-26 ENCOUNTER — Encounter (HOSPITAL_COMMUNITY): Payer: Self-pay | Admitting: *Deleted

## 2023-02-26 DIAGNOSIS — I6389 Other cerebral infarction: Secondary | ICD-10-CM | POA: Diagnosis not present

## 2023-02-26 DIAGNOSIS — R29701 NIHSS score 1: Secondary | ICD-10-CM | POA: Diagnosis present

## 2023-02-26 DIAGNOSIS — Z791 Long term (current) use of non-steroidal anti-inflammatories (NSAID): Secondary | ICD-10-CM | POA: Diagnosis not present

## 2023-02-26 DIAGNOSIS — M5412 Radiculopathy, cervical region: Secondary | ICD-10-CM | POA: Diagnosis present

## 2023-02-26 DIAGNOSIS — N1831 Chronic kidney disease, stage 3a: Secondary | ICD-10-CM | POA: Diagnosis present

## 2023-02-26 DIAGNOSIS — Z6841 Body Mass Index (BMI) 40.0 and over, adult: Secondary | ICD-10-CM

## 2023-02-26 DIAGNOSIS — I639 Cerebral infarction, unspecified: Secondary | ICD-10-CM

## 2023-02-26 DIAGNOSIS — I129 Hypertensive chronic kidney disease with stage 1 through stage 4 chronic kidney disease, or unspecified chronic kidney disease: Secondary | ICD-10-CM | POA: Diagnosis present

## 2023-02-26 DIAGNOSIS — Z7984 Long term (current) use of oral hypoglycemic drugs: Secondary | ICD-10-CM | POA: Diagnosis not present

## 2023-02-26 DIAGNOSIS — G8321 Monoplegia of upper limb affecting right dominant side: Secondary | ICD-10-CM | POA: Diagnosis present

## 2023-02-26 DIAGNOSIS — E1122 Type 2 diabetes mellitus with diabetic chronic kidney disease: Secondary | ICD-10-CM | POA: Diagnosis present

## 2023-02-26 DIAGNOSIS — Z888 Allergy status to other drugs, medicaments and biological substances status: Secondary | ICD-10-CM | POA: Diagnosis not present

## 2023-02-26 DIAGNOSIS — G43909 Migraine, unspecified, not intractable, without status migrainosus: Secondary | ICD-10-CM | POA: Diagnosis present

## 2023-02-26 DIAGNOSIS — Z79899 Other long term (current) drug therapy: Secondary | ICD-10-CM

## 2023-02-26 DIAGNOSIS — Z9282 Status post administration of tPA (rtPA) in a different facility within the last 24 hours prior to admission to current facility: Secondary | ICD-10-CM | POA: Diagnosis not present

## 2023-02-26 DIAGNOSIS — Z7985 Long-term (current) use of injectable non-insulin antidiabetic drugs: Secondary | ICD-10-CM

## 2023-02-26 DIAGNOSIS — E785 Hyperlipidemia, unspecified: Secondary | ICD-10-CM | POA: Diagnosis present

## 2023-02-26 DIAGNOSIS — Z7982 Long term (current) use of aspirin: Secondary | ICD-10-CM | POA: Diagnosis not present

## 2023-02-26 DIAGNOSIS — G459 Transient cerebral ischemic attack, unspecified: Secondary | ICD-10-CM | POA: Diagnosis present

## 2023-02-26 DIAGNOSIS — K219 Gastro-esophageal reflux disease without esophagitis: Secondary | ICD-10-CM | POA: Diagnosis present

## 2023-02-26 LAB — DIFFERENTIAL
Abs Immature Granulocytes: 0.04 10*3/uL (ref 0.00–0.07)
Basophils Absolute: 0.1 10*3/uL (ref 0.0–0.1)
Basophils Relative: 1 %
Eosinophils Absolute: 0.1 10*3/uL (ref 0.0–0.5)
Eosinophils Relative: 1 %
Immature Granulocytes: 0 %
Lymphocytes Relative: 15 %
Lymphs Abs: 1.6 10*3/uL (ref 0.7–4.0)
Monocytes Absolute: 0.7 10*3/uL (ref 0.1–1.0)
Monocytes Relative: 7 %
Neutro Abs: 7.7 10*3/uL (ref 1.7–7.7)
Neutrophils Relative %: 76 %

## 2023-02-26 LAB — COMPREHENSIVE METABOLIC PANEL
ALT: 19 U/L (ref 0–44)
AST: 18 U/L (ref 15–41)
Albumin: 3.8 g/dL (ref 3.5–5.0)
Alkaline Phosphatase: 92 U/L (ref 38–126)
Anion gap: 9 (ref 5–15)
BUN: 21 mg/dL (ref 8–23)
CO2: 23 mmol/L (ref 22–32)
Calcium: 9.2 mg/dL (ref 8.9–10.3)
Chloride: 104 mmol/L (ref 98–111)
Creatinine, Ser: 1.21 mg/dL (ref 0.61–1.24)
GFR, Estimated: >60 mL/min (ref >60)
Glucose, Bld: 94 mg/dL (ref 70–99)
Potassium: 4.1 mmol/L (ref 3.5–5.1)
Sodium: 136 mmol/L (ref 135–145)
Total Bilirubin: 0.8 mg/dL (ref 0.3–1.2)
Total Protein: 6.9 g/dL (ref 6.5–8.1)

## 2023-02-26 LAB — CBC
HCT: 43.1 % (ref 39.0–52.0)
Hemoglobin: 14.6 g/dL (ref 13.0–17.0)
MCH: 30.9 pg (ref 26.0–34.0)
MCHC: 33.9 g/dL (ref 30.0–36.0)
MCV: 91.3 fL (ref 80.0–100.0)
Platelets: 234 10*3/uL (ref 150–400)
RBC: 4.72 MIL/uL (ref 4.22–5.81)
RDW: 13.8 % (ref 11.5–15.5)
WBC: 10.2 10*3/uL (ref 4.0–10.5)
nRBC: 0 % (ref 0.0–0.2)

## 2023-02-26 LAB — PROTIME-INR
INR: 1 (ref 0.8–1.2)
Prothrombin Time: 13.2 seconds (ref 11.4–15.2)

## 2023-02-26 LAB — APTT: aPTT: 27 seconds (ref 24–36)

## 2023-02-26 LAB — CBG MONITORING, ED: Glucose-Capillary: 90 mg/dL (ref 70–99)

## 2023-02-26 LAB — RAPID URINE DRUG SCREEN, HOSP PERFORMED
Amphetamines: NOT DETECTED
Barbiturates: NOT DETECTED
Benzodiazepines: NOT DETECTED
Cocaine: NOT DETECTED
Opiates: NOT DETECTED
Tetrahydrocannabinol: NOT DETECTED

## 2023-02-26 LAB — MRSA NEXT GEN BY PCR, NASAL: MRSA by PCR Next Gen: NOT DETECTED

## 2023-02-26 LAB — ETHANOL: Alcohol, Ethyl (B): <10 mg/dL (ref <10)

## 2023-02-26 LAB — GLUCOSE, CAPILLARY: Glucose-Capillary: 82 mg/dL (ref 70–99)

## 2023-02-26 MED ORDER — LABETALOL HCL 5 MG/ML IV SOLN: 20.0000 mg | Freq: Once | INTRAVENOUS | Status: DC

## 2023-02-26 MED ORDER — TENECTEPLASE FOR STROKE
25.0000 mg | PACK | Freq: Once | INTRAVENOUS | Status: AC
  Administered 2023-02-26: 25 mg via INTRAVENOUS

## 2023-02-26 MED ORDER — ACETAMINOPHEN 650 MG RE SUPP: 650.0000 mg | RECTAL | Status: DC | PRN

## 2023-02-26 MED ORDER — SENNOSIDES-DOCUSATE SODIUM 8.6-50 MG PO TABS: 1.0000 | ORAL_TABLET | Freq: Every evening | ORAL | Status: DC | PRN

## 2023-02-26 MED ORDER — GABAPENTIN 100 MG PO CAPS: 100.0000 mg | ORAL_CAPSULE | Freq: Every day | ORAL | Status: DC

## 2023-02-26 MED ORDER — FAMOTIDINE 20 MG PO TABS
20.0000 mg | ORAL_TABLET | Freq: Every day | ORAL | Status: DC
  Administered 2023-02-27: 20 mg via ORAL
  Filled 2023-02-26: qty 1

## 2023-02-26 MED ORDER — PANTOPRAZOLE SODIUM 40 MG IV SOLR
40.0000 mg | Freq: Every day | INTRAVENOUS | Status: DC
  Administered 2023-02-26: 40 mg via INTRAVENOUS
  Filled 2023-02-26: qty 10

## 2023-02-26 MED ORDER — STROKE: EARLY STAGES OF RECOVERY BOOK
Freq: Once | Status: DC
  Filled 2023-02-26: qty 1

## 2023-02-26 MED ORDER — VITAMIN D (ERGOCALCIFEROL) 1.25 MG (50000 UNIT) PO CAPS: 50000.0000 [IU] | ORAL_CAPSULE | ORAL | Status: DC

## 2023-02-26 MED ORDER — SODIUM CHLORIDE 0.9 % IV SOLN: INTRAVENOUS | Status: DC

## 2023-02-26 MED ORDER — DOCUSATE SODIUM 100 MG PO CAPS
100.0000 mg | ORAL_CAPSULE | Freq: Two times a day (BID) | ORAL | Status: DC
  Administered 2023-02-26: 100 mg via ORAL
  Filled 2023-02-26 (×2): qty 1

## 2023-02-26 MED ORDER — CLEVIDIPINE BUTYRATE 0.5 MG/ML IV EMUL: 0.0000 mg/h | INTRAVENOUS | Status: DC

## 2023-02-26 MED ORDER — INSULIN GLARGINE-YFGN 100 UNIT/ML ~~LOC~~ SOLN: 80.0000 [IU] | Freq: Every day | SUBCUTANEOUS | Status: DC

## 2023-02-26 MED ORDER — INSULIN DETEMIR 100 UNIT/ML ~~LOC~~ SOLN
20.0000 [IU] | Freq: Once | SUBCUTANEOUS | Status: AC
  Administered 2023-02-26: 20 [IU] via SUBCUTANEOUS
  Filled 2023-02-26: qty 0.2

## 2023-02-26 MED ORDER — TENECTEPLASE FOR STROKE
PACK | INTRAVENOUS | Status: AC
  Filled 2023-02-26: qty 10

## 2023-02-26 MED ORDER — IOHEXOL 350 MG/ML SOLN
100.0000 mL | Freq: Once | INTRAVENOUS | Status: AC | PRN
  Administered 2023-02-26: 100 mL via INTRAVENOUS

## 2023-02-26 MED ORDER — ROSUVASTATIN CALCIUM 20 MG PO TABS
20.0000 mg | ORAL_TABLET | Freq: Every day | ORAL | Status: DC
  Administered 2023-02-27: 20 mg via ORAL
  Filled 2023-02-26: qty 1

## 2023-02-26 MED ORDER — ORAL CARE MOUTH RINSE: 15.0000 mL | OROMUCOSAL | Status: DC | PRN

## 2023-02-26 MED ORDER — ADULT MULTIVITAMIN W/MINERALS CH
1.0000 | ORAL_TABLET | Freq: Every day | ORAL | Status: DC
  Administered 2023-02-27: 1 via ORAL
  Filled 2023-02-26: qty 1

## 2023-02-26 MED ORDER — INSULIN ASPART 100 UNIT/ML IJ SOLN: 0.0000 [IU] | Freq: Three times a day (TID) | INTRAMUSCULAR | Status: DC

## 2023-02-26 MED ORDER — ACETAMINOPHEN 160 MG/5ML PO SOLN: 650.0000 mg | ORAL | Status: DC | PRN

## 2023-02-26 MED ORDER — ACETAMINOPHEN 325 MG PO TABS: 650.0000 mg | ORAL_TABLET | ORAL | Status: DC | PRN

## 2023-02-26 MED ORDER — CHLORHEXIDINE GLUCONATE CLOTH 2 % EX PADS
6.0000 | MEDICATED_PAD | Freq: Every day | CUTANEOUS | Status: DC
  Administered 2023-02-26: 6 via TOPICAL

## 2023-02-26 MED ORDER — LORAZEPAM 1 MG PO TABS
2.0000 mg | ORAL_TABLET | Freq: Once | ORAL | Status: AC
  Administered 2023-02-26: 2 mg via ORAL
  Filled 2023-02-26: qty 2

## 2023-02-26 MED ORDER — VITAMIN C 500 MG PO TABS
500.0000 mg | ORAL_TABLET | Freq: Every day | ORAL | Status: DC
  Administered 2023-02-27: 500 mg via ORAL
  Filled 2023-02-26: qty 1

## 2023-02-26 NOTE — ED Notes (Signed)
Pt back from CT

## 2023-02-26 NOTE — ED Notes (Signed)
Arrived back in ED room for CT Angio

## 2023-02-26 NOTE — ED Notes (Signed)
Initial trip to CT

## 2023-02-26 NOTE — ED Notes (Signed)
Patient Walked over to bathroom and then to [employer]

## 2023-02-26 NOTE — Progress Notes (Addendum)
1152-Code stroke activated, Dr. Wilford Corner paged 1153-Dr. Wilford Corner joined virtually by teleneuro cart for assessment 1155-pt taken for CT scan of head 1201-CT scan completed, Dr. Wilford Corner speaking with patient about TNK. Risks/benefits discussed 1202-Pt agreed with receiving TNK. Pt taken back to room for TNK administration 1206-pt arrived back to room in ED. Patient spoke with Dr. Wilford Corner about TNK and all questions answered. LKWT verified as 0800 by patient. VS WNL,Dr. Wilford Corner verified no bleeding on non-contrast head CT imaging. 1209-TNK 25mg  (5 mL) administered by bedside RN 1210-pt taken back to CT for CTA/P of head

## 2023-02-26 NOTE — ED Provider Notes (Signed)
[name] AT [facility] Provider Note   CSN: [id number] Arrival date & time: 02/26/23  1117     History  Chief Complaint  Patient presents with   Numbness    [name] is a 62 y.o. male.  HPI     62 year old male comes in with chief complaint of right-sided numbness.  Patient is right-sided dominant.  Patient has past medical history of hypertension, hyperlipidemia and diabetes.  No history of stroke.  Woke up this morning feeling fine around 6 AM.  He had coffee around 7 AM and still felt fine.  At 8:00 he was sitting on his recliner, when he started noticing right upper extremity numbness that started in his fingertips and then spread to the rest of his upper extremity.  He is absolutely certain when the symptoms started, as he noticed it right away.  Patient denies any associated weakness, no numbness elsewhere, no slurred speech, vision change.  Patient also denies neck pain.  Home Medications Prior to Admission medications   Medication Sig Start Date End Date Taking? Authorizing Provider  acetaminophen (TYLENOL) 500 MG tablet Take 1,000 mg by mouth every 6 (six) hours as needed for mild pain.    [provider]  aspirin EC 81 MG tablet Take 1 tablet (81 mg total) by mouth daily. Patient not taking: Reported on 09/23/2022 04/19/21   Jene Every, MD  diphenhydrAMINE (BENADRYL) 25 MG tablet Take 25 mg by mouth daily. Patient not taking: Reported on 09/23/2022    [provider]  docusate sodium (COLACE) 100 MG capsule Take 1 capsule (100 mg total) by mouth 2 (two) times daily. 04/18/21   Jene Every, MD  famotidine (PEPCID) 20 MG tablet Take 20 mg by mouth daily. 08/17/20   [provider]  furosemide (LASIX) 40 MG tablet Take 40 mg by mouth daily as needed for fluid. 01/20/18   [provider]  gabapentin (NEURONTIN) 100 MG capsule Take 100 mg by mouth daily. 03/01/20   [provider]   JARDIANCE 25 MG TABS tablet Take 25 mg by mouth daily. 03/14/21   [provider]  meloxicam (MOBIC) 15 MG tablet Take 15 mg by mouth daily. Patient not taking: Reported on 09/23/2022    [provider]  methocarbamol (ROBAXIN) 500 MG tablet Take 500 mg by mouth daily as needed for muscle spasms. 02/07/21   [provider]  Multiple Vitamin (MULTIVITAMIN WITH MINERALS) TABS tablet Take 1 tablet by mouth daily.    [provider]  olmesartan-hydrochlorothiazide (BENICAR HCT) 40-25 MG per tablet Take 1 tablet by mouth daily.    [provider]  omeprazole (PRILOSEC) 40 MG capsule Take 40 mg by mouth daily. Patient not taking: Reported on 09/23/2022    [provider]  oxyCODONE (OXY IR/ROXICODONE) 5 MG immediate release tablet Take 1-2 tablets (5-10 mg total) by mouth every 4 (four) hours as needed for severe pain. Patient not taking: Reported on 09/23/2022 04/18/21   Jene Every, MD  [facility], 2 MG/DOSE, 8 MG/3ML SOPN Inject 2 mg into the skin every Friday. 04/10/21   [provider]  polyethylene glycol-electrolytes (TRILYTE) 420 g solution Take 4,000 mLs by mouth as directed. 08/02/22   Dolores Frame, MD  rosuvastatin (CRESTOR) 20 MG tablet Take 20 mg by mouth daily.    [provider]  TOUJEO MAX SOLOSTAR 300 UNIT/ML Solostar Pen Inject 80 Units into the skin every morning. 02/22/21   [provider]  vitamin C (ASCORBIC ACID) 500 MG tablet Take 500 mg by mouth daily.    [provider]  Vitamin D, Ergocalciferol, (DRISDOL) 1.25 MG (50000 UNIT) CAPS capsule Take 50,000 Units by mouth every Monday.    [provider]      Allergies    Quinolones, Tequin [gatifloxacin], and Metformin and related    Review of Systems   Review of Systems  All other systems reviewed and are negative.   Physical Exam Updated Vital Signs BP (!) 141/83   Pulse 73   Temp 97.8 F (36.6 C) (Oral)   Resp  (!) 23   Ht 6\' 3"  (1.905 m)   Wt (!) 146.1 kg   SpO2 98%   BMI 40.25 kg/m  Physical Exam Vitals and nursing note reviewed.  Constitutional:      Appearance: He is well-developed.  HENT:     Head: Atraumatic.  Eyes:     Extraocular Movements: Extraocular movements intact.     Pupils: Pupils are equal, round, and reactive to light.  Cardiovascular:     Rate and Rhythm: Normal rate.  Pulmonary:     Effort: Pulmonary effort is normal.  Musculoskeletal:     Cervical back: Neck supple.  Skin:    General: Skin is warm.  Neurological:     Mental Status: He is alert and oriented to person, place, and time.     Cranial Nerves: No cranial nerve deficit.     Sensory: Sensory deficit present.     Motor: No weakness.     Coordination: Coordination normal.     Comments: NIH stroke scale is 1 for right-sided numbness  Patient still able to distinguish between sharp and dull, but indicates that numbness is pronounced on the right upper extremity compared to left     ED Res ER and antibody ults / Procedures / Treatments   Labs (all labs ordered are listed, but only abnormal results are displayed) Labs Reviewed  ETHANOL  PROTIME-INR  APTT  CBC  DIFFERENTIAL  COMPREHENSIVE METABOLIC PANEL  RAPID URINE DRUG SCREEN, HOSP PERFORMED  I-STAT CHEM 8, ED    EKG EKG Interpretation  Date/Time:  Wednesday Feb 26 2023 11:37:16 EDT Ventricular Rate:  68 PR Interval:  177 QRS Duration: 96 QT Interval:  379 QTC Calculation: 403 R Axis:   30 Text Interpretation: Sinus rhythm Low voltage, precordial leads Minimal ST elevation, inferior leads without reciprocal changes No significant change since last tracing Confirmed by Derwood Kaplan [phone]) on 02/26/2023 12:30:46 PM  Radiology CT HEAD CODE STROKE WO CONTRAST  Result Date: 02/26/2023 CLINICAL DATA:  Provided history: Neuro deficit, acute, stroke suspected. Right-sided numbness. EXAM: CT HEAD WITHOUT CONTRAST TECHNIQUE: Contiguous axial  images were obtained from the base of the skull through the vertex without intravenous contrast. RADIATION DOSE REDUCTION: This exam was performed according to the departmental dose-optimization program which includes automated exposure control, adjustment of the mA and/or kV according to patient size and/or use of iterative reconstruction technique. COMPARISON:  Brain MRI 02/08/2004. Head CT 02/07/2004. FINDINGS: Brain: No age advanced or lobar predominant parenchymal atrophy. There is no acute intracranial hemorrhage. No demarcated cortical infarct. No extra-axial fluid collection. No evidence of an intracranial mass. No midline shift. Vascular: No hyperdense vessel. Skull: No fracture or aggressive osseous lesion. Sinuses/Orbits: No mass or acute finding within the imaged orbits. No significant paranasal sinus disease at the imaged levels. ASPECTS [facility] Stroke Program Early CT Score) - Ganglionic level infarction (caudate, lentiform nuclei, internal  capsule, insula, M1-M3 cortex): 7 - Supraganglionic infarction (M4-M6 cortex): 3 Total score (0-10 with 10 being normal): 10 These results were called by telephone at the time of interpretation on 02/26/2023 at 12:14 pm to provider [facility] , who verbally acknowledged these results. IMPRESSION: No evidence of an acute intracranial abnormality. Electronically Signed   By: Jackey Loge D.O.   On: 02/26/2023 12:16    Procedures .Critical Care  Performed by: Derwood Kaplan, MD Authorized by: Derwood Kaplan, MD   Critical care provider statement:    Critical care time (minutes):  41   Critical care was necessary to treat or prevent imminent or life-threatening deterioration of the following conditions:  CNS failure or compromise   Critical care was time spent personally by me on the following activities:  Development of treatment plan with patient or surrogate, discussions with consultants, evaluation of patient's response to treatment, examination of  patient, ordering and review of laboratory studies, ordering and review of radiographic studies, ordering and performing treatments and interventions, pulse oximetry, re-evaluation of patient's condition and review of old charts     Medications Ordered in ED Medications  tenecteplase (TNKASE) injection for Stroke 25 mg (has no administration in time range)  tenecteplase (TNKASE) 50 MG injection for Stroke (has no administration in time range)  iohexol (OMNIPAQUE) 350 MG/ML injection 100 mL (100 mLs Intravenous Contrast Given 02/26/23 1228)    ED Course/ Medical Decision Making/ A&P                             Medical Decision Making Amount and/or Complexity of Data Reviewed Labs: ordered. Radiology: ordered.  Risk Decision regarding hospitalization.  This patient presents to the ED with chief complaint(s) of right-sided numbness, acute over the upper extremity with pertinent past medical history of diabetes, hypertension and hyperlipidemia.The complaint involves an extensive differential diagnosis and also carries with it a high risk of complications and morbidity.    The differential diagnosis includes : Acute ischemic stroke, acute embolic stroke, brain hemorrhage, severe electrolyte abnormality, myelopathy, radiculopathy, demyelinating process  The initial plan is to activate code stroke as patient presented to the emergency room less than 4-1/2 hours since onset of symptoms.   Additional history obtained: Records reviewed  primary care documents, patient's medications.  Independent labs interpretation:  The following labs were independently interpreted: Labs are reassuring.  Independent visualization and interpretation of imaging: - I independently visualized the following imaging with scope of interpretation limited to determining acute life threatening conditions related to emergency care: CT scan of the brain, which revealed no evidence of brain bleed.  Consultation: -  Consulted or discussed management/test interpretation with external professional: Neurology was consulted as code stroke.  Spoke with Dr. Jerrell Belfast, neurology.  They will be giving patient TNK.  Patient will be admitted to [facility] by stroke team.  Patient's blood pressure is normal. He is aware of the plan.  On repeat assessment, exam unchanged.  12:41 PM Neurology team requested patient be admitted to [facility] and transferred to ER if there will be no bed available soon. We called bed placement.  They will not have a bed available anytime soon. Patient will be transferred ER to ER. Final Clinical Impression(s) / ED Diagnoses Final diagnoses:  Acute ischemic stroke [facility])    Rx / DC Orders ED Discharge Orders     None         Derwood Kaplan, MD 02/26/23 1243

## 2023-02-26 NOTE — ED Provider Notes (Signed)
Blood pressure 126/76, pulse 65, temperature 98.5 F (36.9 C), temperature source Oral, resp. rate 13, height 6\' 3"  (1.905 m), weight (!) 146.1 kg, SpO2 98 %.   In short, [name] is a 62 y.o. male with a chief complaint of Numbness .  Refer to the original H&P for additional details.  Patient arrives to the ED after receiving TNK. Awake. Alert. Stable for admit. NIH 0.    Maia Plan, MD 02/26/23 [phone]

## 2023-02-26 NOTE — ED Notes (Signed)
Gave report to [facility] at [facility] ED.

## 2023-02-26 NOTE — Consult Note (Signed)
[facility] Telemedicine Consult   Requesting Provider: Rhunette Croft Consult Participants: Dr. Marthe Patch, Telespecialist RN Amy   Bedside RN Luisa Hart Location of the provider: [facility] Location of the patient: [place]  This consult was provided via telemedicine with 2-way video and audio communication. The patient/family was informed that care would be provided in this way and agreed to receive care in this manner.   Chief Complaint: RUE tingling and numbness of sudden onset  HPI: 62 year old man history of CKD 3, diabetes, hypertension, hyperlipidemia, morbid obesity presented to the emergency room from an outlying urgent care facility where he had gone for evaluation of sudden onset of right upper extremity tingling and numbness. Woke up at 5 AM this morning normal.  Was sitting on recliner having coffee when he had sudden onset of right upper extremity tingling and numbness.  Went to an urgent care.  He was evaluated and recommended that he come to the ED for an emergent stroke evaluation. Code stroke was activated in the ER. I evaluated him on the camera. Other than sensory symptoms in the right hand, no other deficits noted. Given his dominant hand and disabling symptoms due to sensory changes on that side, risks benefits and alternatives of IV TNKase were discussed with him and he agreed to proceed with IV TNKase administration CT head was reviewed prior to [facility] administration  Past Medical History:  Diagnosis Date   Anginal pain (HCC) 12/29/2012   CKD (chronic kidney disease) stage 3, GFR 30-59 ml/min (HCC)    Diabetes mellitus type 2, noninsulin dependent (HCC)    GERD (gastroesophageal reflux disease)    H/O hiatal hernia    Headache(784.0)    "not daily, not weekly, but very often" (12/30/2012)   HTN (hypertension)    Hyperlipidemia    Morbid obesity (HCC)    Osteoarthritis    "hands" (12/30/2012)    No current facility-administered medications for this encounter.  Current  Outpatient Medications:    acetaminophen (TYLENOL) 500 MG tablet, Take 1,000 mg by mouth every 6 (six) hours as needed for mild pain., Disp: , Rfl:    aspirin EC 81 MG tablet, Take 1 tablet (81 mg total) by mouth daily. (Patient not taking: Reported on 09/23/2022), Disp: 30 tablet, Rfl: 1   diphenhydrAMINE (BENADRYL) 25 MG tablet, Take 25 mg by mouth daily. (Patient not taking: Reported on 09/23/2022), Disp: , Rfl:    docusate sodium (COLACE) 100 MG capsule, Take 1 capsule (100 mg total) by mouth 2 (two) times daily., Disp: 10 capsule, Rfl: 0   famotidine (PEPCID) 20 MG tablet, Take 20 mg by mouth daily., Disp: , Rfl:    furosemide (LASIX) 40 MG tablet, Take 40 mg by mouth daily as needed for fluid., Disp: , Rfl:    gabapentin (NEURONTIN) 100 MG capsule, Take 100 mg by mouth daily., Disp: , Rfl:    JARDIANCE 25 MG TABS tablet, Take 25 mg by mouth daily., Disp: , Rfl:    meloxicam (MOBIC) 15 MG tablet, Take 15 mg by mouth daily. (Patient not taking: Reported on 09/23/2022), Disp: , Rfl:    methocarbamol (ROBAXIN) 500 MG tablet, Take 500 mg by mouth daily as needed for muscle spasms., Disp: , Rfl:    Multiple Vitamin (MULTIVITAMIN WITH MINERALS) TABS tablet, Take 1 tablet by mouth daily., Disp: , Rfl:    olmesartan-hydrochlorothiazide (BENICAR HCT) 40-25 MG per tablet, Take 1 tablet by mouth daily., Disp: , Rfl:    omeprazole (PRILOSEC) 40 MG capsule, Take 40 mg  by mouth daily. (Patient not taking: Reported on 09/23/2022), Disp: , Rfl:    oxyCODONE (OXY IR/ROXICODONE) 5 MG immediate release tablet, Take 1-2 tablets (5-10 mg total) by mouth every 4 (four) hours as needed for severe pain. (Patient not taking: Reported on 09/23/2022), Disp: 30 tablet, Rfl: 0   OZEMPIC, 2 MG/DOSE, 8 MG/3ML SOPN, Inject 2 mg into the skin every Friday., Disp: , Rfl:    polyethylene glycol-electrolytes (TRILYTE) 420 g solution, Take 4,000 mLs by mouth as directed., Disp: 4000 mL, Rfl: 0   rosuvastatin (CRESTOR) 20 MG  tablet, Take 20 mg by mouth daily., Disp: , Rfl:    TOUJEO MAX SOLOSTAR 300 UNIT/ML Solostar Pen, Inject 80 Units into the skin every morning., Disp: , Rfl:    vitamin C (ASCORBIC ACID) 500 MG tablet, Take 500 mg by mouth daily., Disp: , Rfl:    Vitamin D, Ergocalciferol, (DRISDOL) 1.25 MG (50000 UNIT) CAPS capsule, Take 50,000 Units by mouth every Monday., Disp: , Rfl:   LKW: 8 AM TNK-yes IR Thrombectomy? No, exam not c/w ELVO Modified Rankin Scale: 2-Slight disability-UNABLE to perform all activities but does not need assistance Time of teleneurologist evaluation: 1153  Exam: Vitals:   02/26/23 1137 02/26/23 1137  BP:    Pulse: 69   Resp: 12   Temp:  97.8 F (36.6 C)  SpO2: 98%   General: AAOx3 NEURO EXAM Awake alert oriented x 3.  No dysarthria.  No aphasia Cranial nerves II to XII intact Motor examination with no drift Sensation diminished on the right upper extremity in comparison to the left Coordination examination with no dysmetria NIHSS 1A: Level of Consciousness - 0 1B: Ask Month and Age - 0 1C: 'Blink Eyes' & 'Squeeze Hands' - 0 2: Test Horizontal Extraocular Movements - 0 3: Test Visual Fields - 0 4: Test Facial Palsy - 0 5A: Test Left Arm Motor Drift - 0 5B: Test Right Arm Motor Drift - 0 6A: Test Left Leg Motor Drift - 0 6B: Test Right Leg Motor Drift - 0 7: Test Limb Ataxia - 0 8: Test Sensation - 1 9: Test Language/Aphasia- 0 10: Test Dysarthria - 0 11: Test Extinction/Inattention - 0 NIHSS score: 1   Imaging Reviewed: CTH- ASPECTS 10 - No ICH  Labs reviewed in epic and pertinent values follow: CBC    Component Value Date/Time   WBC 10.9 (H) 10/12/2021 0910   RBC 4.86 10/12/2021 0910   HGB 15.1 10/12/2021 0910   HCT 44.9 10/12/2021 0910   PLT 253 10/12/2021 0910   MCV 92.4 10/12/2021 0910   MCH 31.1 10/12/2021 0910   MCHC 33.6 10/12/2021 0910   RDW 13.6 10/12/2021 0910   LYMPHSABS 1.8 10/12/2021 0910   MONOABS 0.8 10/12/2021 0910   EOSABS  0.2 10/12/2021 0910   BASOSABS 0.1 10/12/2021 0910   CMP     Component Value Date/Time   NA 137 10/12/2021 0910   K 4.2 10/12/2021 0910   CL 103 10/12/2021 0910   CO2 25 10/12/2021 0910   GLUCOSE 134 (H) 10/12/2021 0910   BUN 32 (H) 10/12/2021 0910   CREATININE 1.48 (H) 10/12/2021 0910   CALCIUM 9.0 10/12/2021 0910   PROT 7.0 10/12/2021 0910   ALBUMIN 4.0 10/12/2021 0910   AST 19 10/12/2021 0910   ALT 21 10/12/2021 0910   ALKPHOS 108 10/12/2021 0910   BILITOT 0.4 10/12/2021 0910   GFRNONAA 53 (L) 10/12/2021 0910   GFRAA 38 (L) 01/06/2018 1610  Assessment:  62 year old with past history of hypertension hyperlipidemia CKD 3 diabetes presenting for sudden onset of right upper extremity tingling and numbness that started this morning right around 8:00. He was seen at an urgent care and sent to the ER for further evaluation Code stroke was activated. On my evaluation, he had very mild symptoms of sensory deficit on the right upper extremity-NIH stroke scale 1 Given dominant hand, although mild symptoms, risk benefits and alternatives of IV TNKase discussed.  He agreed to proceed. CT head personally reviewed prior to TNK administration-TNK pushed at 1209 hrs. Recommend transfer to [facility] for post TNK care and further workup  Recommendations:  Transfer to [facility]. [facility] accepting at 4 N. ICU. Post TNK neurochecks and vitals-as ordered 2D echo, A1c, lipid panel Therapy assessments 24-hour imaging-MRI brain without contrast No antiplatelets or anticoagulants until post TNK 24-hour imaging negative for bleed. Plan was discussed with the patient in detail over the camera Plan was relayed to Dr. Rhunette Croft. Dr. Iver Nestle was notified admit to [facility].    Risks benefits and alternatives of IV TNKase discussed CT head personally reviewed prior to TNK administration-no evidence of bleed  -- Milon Dikes, MD Neurologist [facility] Pager:  [phone]   CRITICAL CARE ATTESTATION Performed by: Milon Dikes, MD Total critical care time: 30 minutes Critical care time was exclusive of separately billable procedures and treating other patients and/or supervising APPs/Residents/Students Critical care was necessary to treat or prevent imminent or life-threatening deterioration. This patient is critically ill and at significant risk for neurological worsening and/or death and care requires constant monitoring. Critical care was time spent personally by me on the following activities: development of treatment plan with patient and/or surrogate as well as nursing, discussions with consultants, evaluation of patient's response to treatment, examination of patient, obtaining history from patient or surrogate, ordering and performing treatments and interventions, ordering and review of laboratory studies, ordering and review of radiographic studies, pulse oximetry, re-evaluation of patient's condition, participation in multidisciplinary rounds and medical decision making of high complexity in the care of this patient.

## 2023-02-26 NOTE — ED Triage Notes (Addendum)
Pt woke up at 0500 this morning and felt fine, while sitting in the recliner around 0800 noted to have numbness to right arm only.  Equal grips and no arm drift noted. No facial droop noted. Denies any pain

## 2023-02-26 NOTE — H&P (Addendum)
Neurology H&P  CC: numbness and tingling of the right arm  History is obtained from: Patient and chart  HPI: [name] is a 62 y.o. male with history of CKD 3, diabetes, hypertension, hyperlipidemia and morbid obesity who presented with sudden onset right arm tingling and numbness.  Patient states that he was completely normal when he woke up at 5 AM and at 8 AM he had sudden onset of right upper extremity tingling and numbness.  He denies any weakness in his right arm or anywhere else, any dizziness any difficulty speaking or swallowing or other symptoms.  Patient does complain of some numbness in the right lower extremity from the knee downwards, but states that this is chronic and occurred after surgery.  He states that he has had some sinus congestion recently but denies all other symptoms.  Discussed CODE STATUS with patient, and he would like to be full code but states that he would not like to be kept alive by artificial means for a long period of time.   LKW: 0800 TNK given?:  Yes, at 1209 IR Thrombectomy? No, exam not consistent with LVO and no LVO seen on imaging Modified Rankin Scale: 0-Completely asymptomatic and back to baseline post- stroke  NIHSS:  1a Level of Conscious.: 0 1b LOC Questions: 0 1c LOC Commands: 0 2 Best Gaze: 0 3 Visual: 0 4 Facial Palsy: 0 5a Motor Arm - left: 0 5b Motor Arm - Right: 0 6a Motor Leg - Left: 0 6b Motor Leg - Right: 0 7 Limb Ataxia: 0 8 Sensory: 1 (Chronic RLE numbness from prior leg surgery) 9 Best Language: 0 10 Dysarthria: 0 11 Extinct and Inattention.: 0 TOTAL: 1     ROS: A complete ROS was performed and is negative except as noted in the HPI.   Past Medical History:  Diagnosis Date   Anginal pain (HCC) 12/29/2012   CKD (chronic kidney disease) stage 3, GFR 30-59 ml/min (HCC)    Diabetes mellitus type 2, noninsulin dependent (HCC)    GERD (gastroesophageal reflux disease)    H/O hiatal hernia    Headache(784.0)     "not daily, not weekly, but very often" (12/30/2012)   HTN (hypertension)    Hyperlipidemia    Morbid obesity (HCC)    Osteoarthritis    "hands" (12/30/2012)     History reviewed. No pertinent family history.   Social History:  reports that he quit smoking about 40 years ago. His smoking use included cigarettes. He has a 12.00 pack-year smoking history. He quit smokeless tobacco use about 40 years ago.  His smokeless tobacco use included snuff. He reports that he does not drink alcohol and does not use drugs.   Prior to Admission medications   Medication Sig Start Date End Date Taking? Authorizing Provider  acetaminophen (TYLENOL) 500 MG tablet Take 1,000 mg by mouth every 6 (six) hours as needed for mild pain.    [provider]  aspirin EC 81 MG tablet Take 1 tablet (81 mg total) by mouth daily. Patient not taking: Reported on 09/23/2022 04/19/21   Jene Every, MD  diphenhydrAMINE (BENADRYL) 25 MG tablet Take 25 mg by mouth daily. Patient not taking: Reported on 09/23/2022    [provider]  docusate sodium (COLACE) 100 MG capsule Take 1 capsule (100 mg total) by mouth 2 (two) times daily. 04/18/21   Jene Every, MD  famotidine (PEPCID) 20 MG tablet Take 20 mg by mouth daily. 08/17/20   [provider]  furosemide (LASIX) 40 MG tablet Take 40 mg by mouth daily as needed for fluid. 01/20/18   [provider]  gabapentin (NEURONTIN) 100 MG capsule Take 100 mg by mouth daily. 03/01/20   [provider]  JARDIANCE 25 MG TABS tablet Take 25 mg by mouth daily. 03/14/21   [provider]  meloxicam (MOBIC) 15 MG tablet Take 15 mg by mouth daily. Patient not taking: Reported on 09/23/2022    [provider]  methocarbamol (ROBAXIN) 500 MG tablet Take 500 mg by mouth daily as needed for muscle spasms. 02/07/21   [provider]  Multiple Vitamin (MULTIVITAMIN WITH MINERALS) TABS tablet Take 1 tablet by mouth daily.    [provider]  olmesartan-hydrochlorothiazide (BENICAR HCT) 40-25 MG per tablet Take 1 tablet by mouth daily.    [provider]  omeprazole (PRILOSEC) 40 MG capsule Take 40 mg by mouth daily. Patient not taking: Reported on 09/23/2022    [provider]  oxyCODONE (OXY IR/ROXICODONE) 5 MG immediate release tablet Take 1-2 tablets (5-10 mg total) by mouth every 4 (four) hours as needed for severe pain. Patient not taking: Reported on 09/23/2022 04/18/21   Jene Every, MD  [facility], 2 MG/DOSE, 8 MG/3ML SOPN Inject 2 mg into the skin every Friday. 04/10/21   [provider]  polyethylene glycol-electrolytes (TRILYTE) 420 g solution Take 4,000 mLs by mouth as directed. 08/02/22   Dolores Frame, MD  rosuvastatin (CRESTOR) 20 MG tablet Take 20 mg by mouth daily.    [provider]  TOUJEO MAX SOLOSTAR 300 UNIT/ML Solostar Pen Inject 80 Units into the skin every morning. 02/22/21   [provider]  vitamin C (ASCORBIC ACID) 500 MG tablet Take 500 mg by mouth daily.    [provider]  Vitamin D, Ergocalciferol, (DRISDOL) 1.25 MG (50000 UNIT) CAPS capsule Take 50,000 Units by mouth every Monday.    [provider]     Exam: Current vital signs: BP 117/74   Pulse 61   Temp 98.5 F (36.9 C) (Oral)   Resp (!) 9   Ht 6\' 3"  (1.905 m)   Wt (!) 146.1 kg   SpO2 98%   BMI 40.25 kg/m    Physical Exam  Constitutional: Appears well-developed and well-nourished.  Psych: Affect appropriate to situation Eyes: No scleral injection HENT: No OP obstrucion Head: Normocephalic.  Cardiovascular: Normal rate and regular rhythm.  Respiratory: Effort normal and breath sounds normal to anterior ascultation GI: Soft.  No distension. There is no tenderness.  Normal active bowel sounds Skin: WDI  Neuro: Mental Status: Patient is awake, alert, oriented to person, place, month, year, and situation. Patient is able to give a clear and  coherent history. No signs of aphasia or neglect Cranial Nerves: II: Visual Fields are full. Pupils are equal, round, and reactive to light.   III,IV, VI: EOMI without ptosis or diploplia.  V: Facial sensation is symmetric to light touch VII: Facial movement is symmetric resting and smiling VIII: Hearing is intact to voice X: phonation intact XI: Shoulder shrug is symmetric. XII: Tongue is midline without atrophy or fasciculations.  Motor: Tone is normal. Bulk is normal. 5/5 strength was present in all four extremities.  Sensory: Sensation is symmetric to light touch in the arms and legs. Cerebellar: FNF and HKS are intact bilaterally   I have reviewed labs in epic and the pertinent results are:    Latest Ref Rng & Units 02/26/2023  11:56 AM 10/12/2021    9:10 AM 09/01/2021   12:28 PM  CBC  WBC 4.0 - 10.5 K/uL 10.2  10.9  9.8   Hemoglobin 13.0 - 17.0 g/dL 16.1  09.6  04.5   Hematocrit 39.0 - 52.0 % 43.1  44.9  45.2   Platelets 150 - 400 K/uL 234  253  241        Latest Ref Rng & Units 02/26/2023   11:56 AM 10/12/2021    9:10 AM 09/01/2021   12:28 PM  BMP  Glucose 70 - 99 mg/dL 94  409  811   BUN 8 - 23 mg/dL 21  32  27   Creatinine 0.61 - 1.24 mg/dL 9.14  7.82  9.56   Sodium 135 - 145 mmol/L 136  137  136   Potassium 3.5 - 5.1 mmol/L 4.1  4.2  4.5   Chloride 98 - 111 mmol/L 104  103  98   CO2 22 - 32 mmol/L 23  25  29    Calcium 8.9 - 10.3 mg/dL 9.2  9.0  9.6      I have reviewed the images obtained:  CT head: No acute abnormality  CTA head and neck: No acute LVO or hemodynamically significant stenosis  MRI brain: Pending  Impression: Likely acute ischemic stroke status post TNK.  Patient states that currently he has no right arm numbness or tingling and that sensation is equal to that in his left arm.  He states that his symptoms started improving shortly after he was given TNK.  Will need to be admitted to the ICU for post TNK monitoring and blood pressure  control.  Will obtain brain MRI, A1c and lipid panel.  Stroke team to follow in the morning.  Recommendations: Stroke/TIA Workup post TNK - Admit to ICU for post TNK monitoring - Keep blood pressure less than 180/105, use IV labetalol or Cleviprex if needed - MRI brain wo contrast - Strict bedrest for 24 hours - TTE w/ bubble - Check A1c and LDL + add statin per guidelines - DAPT to start 24 hours after TNK - Vital signs and neurochecks every 15 minutes x 2 hours, every 30 minutes x 6 hours then hourly - STAT head CT for any change in neuro exam - Tele - PT/OT/SLP - Stroke education - Stroke team to follow in the morning - Amb referral to neurology upon discharge     Assessment and plan discussed with with attending physician and they are in agreement.   Cortney E Ernestina Columbia , MSN, AGACNP-BC [facility] See Amion for schedule and pager information 02/26/2023 3:43 PM   Attending Neurologist's note:  I personally saw this patient, gathering history, performing a full neurologic examination, reviewing relevant labs, personally reviewing relevant imaging including Head CT and CTA, and formulated the assessment and plan, adding the note above for completeness and clarity to accurately reflect my thoughts  This patient is critically ill and at significant risk of neurological worsening, death and care requires constant monitoring of vital signs, hemodynamics,respiratory and cardiac monitoring, neurological assessment, discussion with family, other specialists and medical decision making of high complexity. I spent 30 minutes of neurocritical care time  in the care of  this patient. This was time spent independent of any time provided by nurse practitioner or PA.  Brooke Dare MD-PhD [facility] [phone] Available 7 AM to 7 PM, outside these hours please contact Neurologist on call listed on AMION

## 2023-02-26 NOTE — ED Notes (Signed)
Arrived at CT for CT Angio, need to establish an 49 G IV

## 2023-02-26 NOTE — ED Notes (Signed)
ED TO INPATIENT HANDOFF REPORT  ED Nurse Name and Phone #: [phone]  S Name/Age/Gender [name] Alcaide 62 y.o. male Room/Bed: 007C/007C  Code Status   Code Status: Full Code  Home/SNF/Other Home Patient oriented to: self, place, time, and situation Is this baseline? Yes   Triage Complete: Triage complete  Chief Complaint Stroke determined by clinical assessment [facility]) [I63.9]  Triage Note Pt woke up at 0500 this morning and felt fine, while sitting in the recliner around 0800 noted to have numbness to right arm only.  Equal grips and no arm drift noted. No facial droop noted. Denies any pain    Allergies Allergies  Allergen Reactions   Quinolones Rash    Other reaction(s): Other (See Comments) "my mouth broke out" "my mouth broke out"    Tequin [Gatifloxacin] Other (See Comments)    "my mouth broke out"   Metformin And Related Nausea Only    Level of Care/Admitting Diagnosis ED Disposition     ED Disposition  Transfer via Transport   Condition  --   Comment  Hospital Area: [facility] [[id number]] Level of Care: ICU [6] May admit patient to [facility] or [facility] if equivalent level of care is available:: No Interfacility transfer: Yes Covid Evaluation: Asymptomatic - no recent exposu re (last 10 days) testing not required Diagnosis: Stroke determined by clinical assessment [facility]) [[id number]] Admitting Physician: Gordy Councilman [[id number]] Attending Physician: Gabriel Rung, MD [phone] Certification:: I certify this patient is being adm itted for an inpatient-only procedure Estimated Length of Stay: 1          B Medical/Surgery History Past Medical History:  Diagnosis Date   Anginal pain (HCC) 12/29/2012   CKD (chronic kidney disease) stage 3, GFR 30-59 ml/min (HCC)    Diabetes mellitus type 2, noninsulin dependent (HCC)    GERD (gastroesophageal reflux disease)    H/O hiatal hernia    Headache(784.0)    "not daily, not weekly, but very  often" (12/30/2012)   HTN (hypertension)    Hyperlipidemia    Morbid obesity (HCC)    Osteoarthritis    "hands" (12/30/2012)   Past Surgical History:  Procedure Laterality Date   CARPAL TUNNEL RELEASE Left 01/08/2018   Procedure: LEFT CARPAL TUNNEL RELEASE;  Surgeon: Cindee Salt, MD;  Location: [facility];  Service: Orthopedics;  Laterality: Left;  axillary block   ELBOW ARTHROPLASTY Left 1990's   "3 spurs; ?14 staples" (12/30/2012)   KNEE ARTHROSCOPY WITH SUBCHONDROPLASTY Right 04/18/2021   Procedure: KNEE ARTHROSCOPY WITH PARTIAL MEDIAL AND LATERAL MENISCECTOMY AND DEBRIDEMENT,MICROFRACTURE medial tibial plateau;  Surgeon: Jene Every, MD;  Location: [facility];  Service: Orthopedics;  Laterality: Right;    LEFT HEART CATHETERIZATION WITH CORONARY ANGIOGRAM N/A 12/31/2012   Procedure: LEFT HEART CATHETERIZATION WITH CORONARY ANGIOGRAM;  Surgeon: Tonny Bollman, MD;  Location: [facility] CATH [facility];  Service: Cardiovascular;  Laterality: N/A;   LUMBAR DISC SURGERY  2010; 2011   "2 ruptured discs; 2 pinched nerves"   SHOULDER SURGERY Right    ULNAR NERVE TRANSPOSITION Left 01/08/2018   Procedure: DECOMPRESSION LEFT ULNAR AT ELBOW;  Surgeon: Cindee Salt, MD;  Location: [facility];  Service: Orthopedics;  Laterality: Left;  axillary block     A IV Location/Drains/Wounds Patient Lines/Drains/Airways Status     Active Line/Drains/Airways     Name Placement date Placement time Site Days   Peripheral IV 02/26/23 20 G 1" Anterior;Distal;Right;Upper Arm 02/26/23  1200  Arm  less than  1   Peripheral IV 02/26/23 18 G Right Antecubital 02/26/23  1227  Antecubital  less than 1   Incision (Closed) 01/08/18 Wrist Left 01/08/18  0951  -- 1875   Incision (Closed) 01/08/18 Elbow Left 01/08/18  0951  -- 1875   Incision (Closed) 04/18/21 Knee Right 04/18/21  1324  -- 679            Intake/Output Last 24 hours No intake or output data in the 24 hours ending 02/26/23  1948  Labs/Imaging Results for orders placed or performed during the hospital encounter of 02/26/23 (from the past 48 hour(s))  Ethanol     Status: None   Collection Time: 02/26/23 11:56 AM  Result Value Ref Range   Alcohol, Ethyl (B) <10 <10 mg/dL    Comment: (NOTE) Lowest detectable limit for serum alcohol is 10 mg/dL.  For medical purposes only. Performed at [facility], [address] [place], [place] [address]   Protime-INR     Status: None   Collection Time: 02/26/23 11:56 AM  Result Value Ref Range   Prothrombin Time 13.2 11.4 - 15.2 seconds   INR 1.0 0.8 - 1.2    Comment: (NOTE) INR goal varies based on device and disease states. Performed at [facility], [address] [place], [place] [address]   APTT     Status: None   Collection Time: 02/26/23 11:56 AM  Result Value Ref Range   aPTT 27 24 - 36 seconds    Comment: Performed at [facility], [address] [place], [place] [address]  CBC     Status: None   Collection Time: 02/26/23 11:56 AM  Result Value Ref Range   WBC 10.2 4.0 - 10.5 K/uL   RBC 4.72 4.22 - 5.81 MIL/uL   Hemoglobin 14.6 13.0 - 17.0 g/dL   HCT 21.3 08.6 - 57.8 %   MCV 91.3 80.0 - 100.0 fL   MCH 30.9 26.0 - 34.0 pg   MCHC 33.9 30.0 - 36.0 g/dL   RDW 46.9 62.9 - 52.8 %   Platelets 234 150 - 400 K/uL   nRBC 0.0 0.0 - 0.2 %    Comment: Performed at [facility], [address], [place], [place] [address]  Differential     Status: None   Collection Time: 02/26/23 11:56 AM  Result Value Ref Range   Neutrophils Relative % 76 %   Neutro Abs 7.7 1.7 - 7.7 K/uL   Lymphocytes Relative 15 %   Lymphs Abs 1.6 0.7 - 4.0 K/uL   Monocytes Relative 7 %   Monocytes Absolute 0.7 0.1 - 1.0 K/uL   Eosinophils Relative 1 %   Eosinophils Absolute 0.1 0.0 - 0.5 K/uL   Basophils Relative 1 %   Basophils Absolute 0.1 0.0 - 0.1 K/uL   Immature Granulocytes 0 %   Abs Immature Granulocytes 0.04 0.00 - 0.07 K/uL    Comment: Performed at [facility], [address] [place], [place] [address]  Comprehensive metabolic panel     Status: None   Collection Time: 02/26/23 11:56 AM  Result Value Ref Range   Sodium 136 135 - 145 mmol/L   Potassium 4.1 3.5 - 5.1 mmol/L   Chloride 104 98 - 111 mmol/L   CO2 23 22 - 32 mmol/L   Glucose, Bld 94 70 - 99 mg/dL    Comment: Glucose reference range applies only to samples taken after fasting for at least 8 hours.   BUN 21 8 -  23 mg/dL   Creatinine, Ser 1.61 0.61 - 1.24 mg/dL   Calcium 9.2 8.9 - 09.6 mg/dL   Total Protein 6.9 6.5 - 8.1 g/dL   Albumin 3.8 3.5 - 5.0 g/dL   AST 18 15 - 41 U/L   ALT 19 0 - 44 U/L   Alkaline Phosphatase 92 38 - 126 U/L   Total Bilirubin 0.8 0.3 - 1.2 mg/dL   GFR, Estimated >04 >54 mL/min    Comment: (NOTE) Calculated using the CKD-EPI Creatinine Equation (2021)    Anion gap 9 5 - 15    Comment: Performed at [facility], [address], [place], [place] [address]  Urine rapid drug screen (hosp performed)     Status: None   Collection Time: 02/26/23  1:14 PM  Result Value Ref Range   Opiates NONE DETECTED NONE DETECTED   Cocaine NONE DETECTED NONE DETECTED   Benzodiazepines NONE DETECTED NONE DETECTED   Amphetamines NONE DETECTED NONE DETECTED   Tetrahydrocannabinol NONE DETECTED NONE DETECTED   Barbiturates NONE DETECTED NONE DETECTED    Comment: (NOTE) DRUG SCREEN FOR MEDICAL PURPOSES ONLY.  IF CONFIRMATION IS NEEDED FOR ANY PURPOSE, NOTIFY LAB WITHIN 5 DAYS.  LOWEST DETECTABLE LIMITS FOR URINE DRUG SCREEN Drug Class                     Cutoff (ng/mL) Amphetamine and metabolites    1000 Barbiturate and metabolites    200 Benzodiazepine                 200 Opiates and metabolites        300 Cocaine and metabolites        300 THC                            50 Performed at [facility], [address] [place], [place] [address]   CBG monitoring, ED     Status: None   Collection Time: 02/26/23  6:18 PM  Result Value Ref Range   Glucose-Capillary 90 70 - 99 mg/dL     Comment: Glucose reference range applies only to samples taken after fasting for at least 8 hours.   MR BRAIN WO CONTRAST  Result Date: 02/26/2023 CLINICAL DATA:  Provided history: Stroke, follow-up. EXAM: MRI HEAD WITHOUT CONTRAST TECHNIQUE: Multiplanar, multiecho pulse sequences of the brain and surrounding structures were obtained without intravenous contrast. COMPARISON:  Non-contrast head CT and CT angiogram head/neck 02/26/2023. Brain MRI 02/08/2004. FINDINGS: The patient was unable to tolerate the full examination. As a result, only axial diffusion-weighted imaging could be acquired. There is no evidence of an acute infarct. IMPRESSION: The patient was unable to tolerate the full examination. Only axial diffusion-weighted imaging could be acquired. No evidence of an acute infarct. Electronically Signed   By: Jackey Loge D.O.   On: 02/26/2023 18:49   CT ANGIO HEAD NECK W WO CM W PERF (CODE STROKE)  Result Date: 02/26/2023 CLINICAL DATA:  Neuro deficit, acute, stroke suspected EXAM: EXAM CT ANGIOGRAPHY HEAD AND NECK WITH AND WITHOUT CONTRAST TECHNIQUE: TECHNIQUE Multidetector CT imaging of the head and neck was performed using the standard protocol during bolus administration of intravenous contrast. Multiplanar CT image reconstructions and MIPs were obtained to evaluate the vascular anatomy. Carotid stenosis measurements (when applicable) are obtained utilizing NASCET criteria, using the distal internal carotid diameter as the denominator. RADIATION DOSE REDUCTION: This exam was performed according to the departmental  dose-optimization program which includes automated exposure control, adjustment of the mA and/or kV according to patient size and/or use of iterative reconstruction technique. CONTRAST:  100 mL of Omnipaque 350 IV. COMPARISON:  Same day CT head. FINDINGS: CTA neck: Aorta: Great vessel origins are patent without significant stenosis. Right carotid: Patent without greater than 50%  stenosis. Left carotid: Patent without greater than 50% stenosis. Vertebral arteries: Patent bilaterally without greater than 50% stenosis. 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 Otherwise, bilateral intracranial ICAs, MCAs, and ACAs are patent without proximal hemodynamically significant stenosis. Posterior circulation: Left dominant intradural vertebral artery. Right intradural vertebral artery appears to largely terminate as PICA, anatomic variant. The vertebral arteries, basilar artery and bilateral posterior cerebral arteries are patent without proximal hemodynamically significant stenosis. Venous sinuses: Patent. Anatomic variants: See above. IMPRESSION: No emergent large vessel occlusion or proximal hemodynamically significant stenosis. Electronically Signed   By: Feliberto Harts M.D.   On: 02/26/2023 12:50   CT HEAD CODE STROKE WO CONTRAST  Result Date: 02/26/2023 CLINICAL DATA:  Provided history: Neuro deficit, acute, stroke suspected. Right-sided numbness. EXAM: CT HEAD WITHOUT CONTRAST TECHNIQUE: Contiguous axial images were obtained from the base of the skull through the vertex without intravenous contrast. RADIATION DOSE REDUCTION: This exam was performed according to the departmental dose-optimization program which includes automated exposure control, adjustment of the mA and/or kV according to patient size and/or use of iterative reconstruction technique. COMPARISON:  Brain MRI 02/08/2004. Head CT 02/07/2004. FINDINGS: Brain: No age advanced or lobar predominant parenchymal atrophy. There is no acute intracranial hemorrhage. No demarcated cortical infarct. No extra-axial fluid collection. No evidence of an intracranial mass. No midline shift. Vascular: No hyperdense vessel. Skull: No fracture or aggressive osseous lesion. Sinuses/Orbits: No mass or acute finding within the imaged orbits. No significant paranasal sinus disease at the imaged levels.  ASPECTS (Alberta Stroke Program Early CT Score) - Ganglionic level infarction (caudate, lentiform nuclei, internal capsule, insula, M1-M3 cortex): 7 - Supraganglionic infarction (M4-M6 cortex): 3 Total score (0-10 with 10 being normal): 10 These results were called by telephone at the time of interpretation on 02/26/2023 at 12:14 pm to provider [facility] , who verbally acknowledged these results. IMPRESSION: No evidence of an acute intracranial abnormality. Electronically Signed   By: Jackey Loge D.O.   On: 02/26/2023 12:16    Pending Labs Unresulted Labs (From admission, onward)     Start     Ordered   02/27/23 0500  HIV Antibody (routine testing w rflx)  (HIV Antibody (Routine testing w reflex) panel)  Tomorrow morning,   R        02/26/23 1530   02/27/23 0500  Lipid panel  (Labs)  Tomorrow morning,   R       Comments: Fasting    02/26/23 1530   02/27/23 0500  Hemoglobin A1c  (Labs)  Tomorrow morning,   R       Comments: To assess prior glycemic control    02/26/23 1530   02/27/23 0500  CBC  (Labs)  Tomorrow morning,   R        02/26/23 1530   02/27/23 0500  Comprehensive metabolic panel  (Labs)  Tomorrow morning,   R        02/26/23 1530            Vitals/Pain Today's Vitals   02/26/23 1700 02/26/23 1730 02/26/23 1830 02/26/23 1900  BP: 122/74 125/68 130/74 131/66  Pulse: 68 79 90 70  Resp: 12 17 15 14   Temp:      TempSrc:      SpO2: 97% 95% 95% 98%  Weight:      Height:      PainSc:        Isolation Precautions No active isolations  Medications Medications  tenecteplase (TNKASE) 50 MG injection for Stroke (has no administration in time range)   stroke: early stages of recovery book (has no administration in time range)  0.9 %  sodium chloride infusion (has no administration in time range)  acetaminophen (TYLENOL) tablet 650 mg (has no administration in time range)    Or  acetaminophen (TYLENOL) 160 MG/5ML solution 650 mg (has no administration in time  range)    Or  acetaminophen (TYLENOL) suppository 650 mg (has no administration in time range)  senna-docusate (Senokot-S) tablet 1 tablet (has no administration in time range)  pantoprazole (PROTONIX) injection 40 mg (has no administration in time range)  labetalol (NORMODYNE) injection 20 mg (has no administration in time range)    And  clevidipine (CLEVIPREX) infusion 0.5 mg/mL (has no administration in time range)  ascorbic acid (VITAMIN C) tablet 500 mg (has no administration in time range)  docusate sodium (COLACE) capsule 100 mg (has no administration in time range)  famotidine (PEPCID) tablet 20 mg (has no administration in time range)  gabapentin (NEURONTIN) capsule 100 mg (has no administration in time range)  multivitamin with minerals tablet 1 tablet (has no administration in time range)  rosuvastatin (CRESTOR) tablet 20 mg (has no administration in time range)  Vitamin D (Ergocalciferol) (DRISDOL) 1.25 MG (50000 UNIT) capsule 50,000 Units (has no administration in time range)  insulin aspart (novoLOG) injection 0-15 Units (has no administration in time range)  insulin glargine-yfgn (SEMGLEE) injection 80 Units (has no administration in time range)  tenecteplase (TNKASE) injection for Stroke 25 mg (25 mg Intravenous Given 02/26/23 1209)  iohexol (OMNIPAQUE) 350 MG/ML injection 100 mL (100 mLs Intravenous Contrast Given 02/26/23 1228)  LORazepam (ATIVAN) tablet 2 mg (2 mg Oral Given 02/26/23 1700)  insulin detemir (LEVEMIR) injection 20 Units (20 Units Subcutaneous Given 02/26/23 1741)    Mobility walks     Focused Assessments Neuro Assessment Handoff:  Swallow screen pass? Yes    NIH Stroke Scale  Dizziness Present: No Headache Present: No Interval: Shift assessment Level of Consciousness (1a.)   : Alert, keenly responsive LOC Questions (1b. )   : Answers both questions correctly LOC Commands (1c. )   : Performs both tasks correctly Best Gaze (2. )  : Normal Visual (3.  )  : No visual loss Facial Palsy (4. )    : Normal symmetrical movements Motor Arm, Left (5a. )   : No drift Motor Arm, Right (5b. ) : No drift Motor Leg, Left (6a. )  : No drift Motor Leg, Right (6b. ) : No drift Limb Ataxia (7. ): Absent Sensory (8. )  : Normal, no sensory loss Best Language (9. )  : No aphasia Dysarthria (10. ): Normal Extinction/Inattention (11.)   : No Abnormality Complete NIHSS TOTAL: 0 Last date known well: 02/26/23 Last time known well: 0800 Neuro Assessment: Within Defined Limits Neuro Checks:   Initial (02/26/23 1153)  Has TPA been given? Yes Temp: 98.5 F (36.9 C) (05/15 1434) Temp Source: Oral (05/15 1434) BP: 131/66 (05/15 1900) Pulse Rate: 70 (05/15 1900) If patient is a Neuro Trauma and patient is going to OR before floor call report to [employer]  nurse: [phone] or [phone]   R Recommendations: See Admitting Provider Note  Report given to:   Additional Notes:

## 2023-02-27 ENCOUNTER — Inpatient Hospital Stay (HOSPITAL_COMMUNITY): Payer: 59

## 2023-02-27 ENCOUNTER — Other Ambulatory Visit (HOSPITAL_COMMUNITY): Payer: Self-pay

## 2023-02-27 DIAGNOSIS — G459 Transient cerebral ischemic attack, unspecified: Principal | ICD-10-CM

## 2023-02-27 DIAGNOSIS — I6389 Other cerebral infarction: Secondary | ICD-10-CM

## 2023-02-27 DIAGNOSIS — Z9282 Status post administration of tPA (rtPA) in a different facility within the last 24 hours prior to admission to current facility: Secondary | ICD-10-CM

## 2023-02-27 LAB — LIPID PANEL
Cholesterol: 87 mg/dL (ref 0–200)
HDL: 36 mg/dL — ABNORMAL LOW (ref >40)
LDL Cholesterol: 35 mg/dL (ref 0–99)
Total CHOL/HDL Ratio: 2.4 RATIO
Triglycerides: 79 mg/dL (ref <150)
VLDL: 16 mg/dL (ref 0–40)

## 2023-02-27 LAB — COMPREHENSIVE METABOLIC PANEL
ALT: 15 U/L (ref 0–44)
AST: 16 U/L (ref 15–41)
Albumin: 3.4 g/dL — ABNORMAL LOW (ref 3.5–5.0)
Alkaline Phosphatase: 76 U/L (ref 38–126)
Anion gap: 8 (ref 5–15)
BUN: 18 mg/dL (ref 8–23)
CO2: 24 mmol/L (ref 22–32)
Calcium: 9 mg/dL (ref 8.9–10.3)
Chloride: 104 mmol/L (ref 98–111)
Creatinine, Ser: 1.24 mg/dL (ref 0.61–1.24)
GFR, Estimated: >60 mL/min (ref >60)
Glucose, Bld: 89 mg/dL (ref 70–99)
Potassium: 3.6 mmol/L (ref 3.5–5.1)
Sodium: 136 mmol/L (ref 135–145)
Total Bilirubin: 0.5 mg/dL (ref 0.3–1.2)
Total Protein: 6 g/dL — ABNORMAL LOW (ref 6.5–8.1)

## 2023-02-27 LAB — CBC
HCT: 39.6 % (ref 39.0–52.0)
Hemoglobin: 13.3 g/dL (ref 13.0–17.0)
MCH: 30.2 pg (ref 26.0–34.0)
MCHC: 33.6 g/dL (ref 30.0–36.0)
MCV: 90 fL (ref 80.0–100.0)
Platelets: 224 10*3/uL (ref 150–400)
RBC: 4.4 MIL/uL (ref 4.22–5.81)
RDW: 13.7 % (ref 11.5–15.5)
WBC: 12.3 10*3/uL — ABNORMAL HIGH (ref 4.0–10.5)
nRBC: 0 % (ref 0.0–0.2)

## 2023-02-27 LAB — GLUCOSE, CAPILLARY
Glucose-Capillary: 106 mg/dL — ABNORMAL HIGH (ref 70–99)
Glucose-Capillary: 87 mg/dL (ref 70–99)
Glucose-Capillary: 116 mg/dL — ABNORMAL HIGH (ref 70–99)
Glucose-Capillary: 112 mg/dL — ABNORMAL HIGH (ref 70–99)

## 2023-02-27 LAB — HIV ANTIBODY (ROUTINE TESTING W REFLEX): HIV Screen 4th Generation wRfx: NONREACTIVE

## 2023-02-27 LAB — ECHOCARDIOGRAM COMPLETE
AR max vel: 2.92 cm2
AV Peak grad: 4.3 mmHg
Ao pk vel: 1.04 m/s
Area-P 1/2: 3.42 cm2
Height: 75 in
S' Lateral: 3.4 cm
Weight: 5152 oz

## 2023-02-27 LAB — HEMOGLOBIN A1C
Hgb A1c MFr Bld: 5.5 % (ref 4.8–5.6)
Mean Plasma Glucose: 111.15 mg/dL

## 2023-02-27 MED ORDER — PERFLUTREN LIPID MICROSPHERE
1.0000 mL | INTRAVENOUS | Status: AC | PRN
  Administered 2023-02-27: 2 mL via INTRAVENOUS

## 2023-02-27 MED ORDER — CLOPIDOGREL BISULFATE 75 MG PO TABS
75.0000 mg | ORAL_TABLET | Freq: Every day | ORAL | Status: DC
  Administered 2023-02-27: 75 mg via ORAL
  Filled 2023-02-27: qty 1

## 2023-02-27 MED ORDER — ASPIRIN 81 MG PO TBEC
81.0000 mg | DELAYED_RELEASE_TABLET | Freq: Every day | ORAL | Status: DC
  Administered 2023-02-27: 81 mg via ORAL
  Filled 2023-02-27: qty 1

## 2023-02-27 MED ORDER — FLUOXETINE HCL 20 MG PO CAPS
20.0000 mg | ORAL_CAPSULE | Freq: Every day | ORAL | Status: DC
  Filled 2023-02-27: qty 1

## 2023-02-27 MED ORDER — GABAPENTIN 100 MG PO CAPS
100.0000 mg | ORAL_CAPSULE | Freq: Three times a day (TID) | ORAL | Status: DC
  Administered 2023-02-27 (×2): 100 mg via ORAL
  Filled 2023-02-27 (×2): qty 1

## 2023-02-27 MED ORDER — CLOPIDOGREL BISULFATE 75 MG PO TABS
75.0000 mg | ORAL_TABLET | Freq: Every day | ORAL | 0 refills | Status: DC
  Filled 2023-02-27: qty 21, 21d supply, fill #0

## 2023-02-27 MED ORDER — EMPAGLIFLOZIN 25 MG PO TABS
25.0000 mg | ORAL_TABLET | Freq: Every day | ORAL | Status: DC
  Filled 2023-02-27: qty 1

## 2023-02-27 MED ORDER — PANTOPRAZOLE SODIUM 40 MG PO TBEC: 40.0000 mg | DELAYED_RELEASE_TABLET | Freq: Every day | ORAL | Status: DC

## 2023-02-27 NOTE — TOC Initial Note (Signed)
Transition of Care [facility]) - Initial/Assessment Note    Patient Details  Name: [name] MRN: [id number] Date of Birth: 1961/01/18  [facility]) CM/SW Contact:    Tom-Johnson, Hershal Coria, RN Phone Number: 02/27/2023, 2:51 PM  Clinical Narrative:                  CM spoke with patient at bedside about needs for post hospital transition. Admitted for Numbness and Tingling of the right arm, received TNK. Neurology following.   Patient is from home alone, does not have children, has six supportive siblings. Currently on disability , independent with care prior to admission.  Has a cane and walker at home.  PCP is Corrington, Kip A, MD and uses [employer] in [place]. Awaiting PT/OT eval for disposition.  CM will continue to follow as patient progresses with care towards discharge.           Barriers to Discharge: Continued Medical Work up   Patient Goals and CMS Choice Patient states their goals for this hospitalization and ongoing recovery are:: To return home. CMS Medicare.gov Compare Post Acute Care list provided to:: Patient        Expected Discharge Plan and Services   Discharge Planning Services: CM Consult   Living arrangements for the past 2 months: Apartment                                      Prior Living Arrangements/Services Living arrangements for the past 2 months: Apartment Lives with:: Self Patient language and need for interpreter reviewed:: Yes Do you feel safe going back to the place where you live?: Yes      Need for Family Participation in Patient Care: Yes (Comment) Care giver support system in place?: Yes (comment) Current home services: DME (Cane, walker) Criminal Activity/Legal Involvement Pertinent to Current Situation/Hospitalization: No - Comment as needed  Activities of Daily Living      Permission Sought/Granted Permission sought to share information with : Case Manager, Other  (comment) Permission granted to share information with : Yes, Verbal Permission Granted              Emotional Assessment Appearance:: Appears stated age Attitude/Demeanor/Rapport: Engaged, Gracious Affect (typically observed): Accepting, Appropriate, Calm, Hopeful, Pleasant Orientation: : Oriented to Self, Oriented to Place, Oriented to  Time, Oriented to Situation Alcohol / Substance Use: Not Applicable Psych Involvement: No (comment)  Admission diagnosis:  Acute ischemic stroke (HCC) [I63.9] Stroke determined by clinical assessment [facility]) [I63.9] Patient Active Problem List   Diagnosis Date Noted   Stroke determined by clinical assessment (HCC) 02/26/2023   Guaiac positive stools 10/24/2016   Precordial pain 12/30/2012   HTN (hypertension)    Diabetes mellitus type 2, noninsulin dependent (HCC)    Hyperlipidemia    Morbid obesity (HCC)    CKD (chronic kidney disease) stage 3, GFR 30-59 ml/min (HCC)    Anginal pain (HCC) 12/29/2012   PCP:  Vivien Presto, MD Pharmacy:   [facility] [address] [place] - [address] [place] [address] Phone: [phone] Fax: [phone]     Social Determinants of Health (SDOH) Social History: SDOH Screenings   Tobacco Use: Medium Risk (02/26/2023)   SDOH Interventions: Transportation Interventions: Intervention Not Indicated, Inpatient TOC, Patient Resources (Friends/Family)   Readmission Risk Interventions    02/27/2023    2:49 PM  Readmission Risk Prevention Plan  Post Dischage Appt Complete  Medication Screening Complete  Transportation Screening Complete

## 2023-02-27 NOTE — Discharge Summary (Signed)
Stroke Discharge Summary  Patient ID: [name]   MRN: [id number]      DOB: 10/02/1961  Date of Admission: 02/26/2023 Date of Discharge: 02/27/2023  Attending Physician:  Stroke, Md, MD, Stroke MD Patient's PCP:  Corrington, Meredith Mody, MD  DISCHARGE DIAGNOSIS:  Strokelike symptoms status post TNK Possible TIA, DDx including cervical radiculopathy and migraine equivalent  Other issues Hypertension Hyperlipidemia Diabetes CKD 3A Obesity   Allergies as of 02/27/2023       Reactions   Quinolones Rash   Other reaction(s): Other (See Comments) "my mouth broke out" "my mouth broke out"   Tequin [gatifloxacin] Other (See Comments)   "my mouth broke out"   Metformin And Related Nausea Only        Medication List     STOP taking these medications    ascorbic acid 500 MG tablet Commonly known as: VITAMIN C   diphenhydrAMINE 25 MG tablet Commonly known as: BENADRYL       TAKE these medications    acetaminophen 500 MG tablet Commonly known as: TYLENOL Take 1,000 mg by mouth every 6 (six) hours as needed for mild pain.   aspirin EC 81 MG tablet Take 1 tablet (81 mg total) by mouth daily.   clopidogrel 75 MG tablet Commonly known as: PLAVIX Take 1 tablet (75 mg total) by mouth daily.   docusate sodium 100 MG capsule Commonly known as: COLACE Take 1 capsule (100 mg total) by mouth 2 (two) times daily.   famotidine 20 MG tablet Commonly known as: PEPCID Take 20 mg by mouth daily.   FLUoxetine 20 MG capsule Commonly known as: PROZAC Take 20 mg by mouth daily.   furosemide 40 MG tablet Commonly known as: LASIX Take 40 mg by mouth daily as needed for fluid.   gabapentin 100 MG capsule Commonly known as: NEURONTIN Take 100 mg by mouth daily.   Jardiance 25 MG Tabs tablet Generic drug: empagliflozin Take 25 mg by mouth daily.   multivitamin with minerals Tabs tablet Take 1 tablet by mouth daily.   Olmesartan-amLODIPine-HCTZ 20-5-12.5 MG  Tabs Take 1 tablet by mouth daily.   Ozempic (2 MG/DOSE) 8 MG/3ML Sopn Generic drug: Semaglutide (2 MG/DOSE) Inject 2 mg into the skin every Friday.   rosuvastatin 20 MG tablet Commonly known as: CRESTOR Take 20 mg by mouth daily.   Toujeo Max SoloStar 300 UNIT/ML Solostar Pen Generic drug: insulin glargine (2 Unit Dial) Inject 80 Units into the skin every morning.   Vitamin D (Ergocalciferol) 1.25 MG (50000 UNIT) Caps capsule Commonly known as: DRISDOL Take 50,000 Units by mouth every Monday.        LABORATORY STUDIES CBC    Component Value Date/Time   WBC 12.3 (H) 02/27/2023 0025   RBC 4.40 02/27/2023 0025   HGB 13.3 02/27/2023 0025   HCT 39.6 02/27/2023 0025   PLT 224 02/27/2023 0025   MCV 90.0 02/27/2023 0025   MCH 30.2 02/27/2023 0025   MCHC 33.6 02/27/2023 0025   RDW 13.7 02/27/2023 0025   LYMPHSABS 1.6 02/26/2023 1156   MONOABS 0.7 02/26/2023 1156   EOSABS 0.1 02/26/2023 1156   BASOSABS 0.1 02/26/2023 1156   CMP    Component Value Date/Time   NA 136 02/27/2023 0025   K 3.6 02/27/2023 0025   CL 104 02/27/2023 0025   CO2 24 02/27/2023 0025   GLUCOSE 89 02/27/2023 0025   BUN 18 02/27/2023 0025   CREATININE 1.24 02/27/2023 0025  CALCIUM 9.0 02/27/2023 0025   PROT 6.0 (L) 02/27/2023 0025   ALBUMIN 3.4 (L) 02/27/2023 0025   AST 16 02/27/2023 0025   ALT 15 02/27/2023 0025   ALKPHOS 76 02/27/2023 0025   BILITOT 0.5 02/27/2023 0025   GFRNONAA >60 02/27/2023 0025   GFRAA 38 (L) 01/06/2018 0952   COAGS Lab Results  Component Value Date   INR 1.0 02/26/2023   INR 0.98 12/31/2012   INR 0.96 09/20/2010   Lipid Panel    Component Value Date/Time   CHOL 87 02/27/2023 0025   TRIG 79 02/27/2023 0025   HDL 36 (L) 02/27/2023 0025   CHOLHDL 2.4 02/27/2023 0025   VLDL 16 02/27/2023 0025   LDLCALC 35 02/27/2023 0025   HgbA1C  Lab Results  Component Value Date   HGBA1C 5.5 02/27/2023   Urinalysis    Component Value Date/Time   COLORURINE YELLOW  09/20/2010 1050   APPEARANCEUR CLEAR 09/20/2010 1050   LABSPEC 1.010 09/20/2010 1050   PHURINE 6.0 09/20/2010 1050   GLUCOSEU NEGATIVE 09/20/2010 1050   HGBUR NEGATIVE 09/20/2010 1050   BILIRUBINUR NEGATIVE 09/20/2010 1050   KETONESUR NEGATIVE 09/20/2010 1050   PROTEINUR NEGATIVE 09/20/2010 1050   UROBILINOGEN 0.2 09/20/2010 1050   NITRITE NEGATIVE 09/20/2010 1050   LEUKOCYTESUR  09/20/2010 1050    NEGATIVE MICROSCOPIC NOT DONE ON URINES WITH NEGATIVE PROTEIN, BLOOD, LEUKOCYTES, NITRITE, OR GLUCOSE <1000 mg/dL.   Urine Drug Screen     Component Value Date/Time   LABOPIA NONE DETECTED 02/26/2023 1314   COCAINSCRNUR NONE DETECTED 02/26/2023 1314   LABBENZ NONE DETECTED 02/26/2023 1314   AMPHETMU NONE DETECTED 02/26/2023 1314   THCU NONE DETECTED 02/26/2023 1314   LABBARB NONE DETECTED 02/26/2023 1314    Alcohol Level    Component Value Date/Time   ETH <10 02/26/2023 1156     SIGNIFICANT DIAGNOSTIC STUDIES MR BRAIN WO CONTRAST  Result Date: 02/26/2023 CLINICAL DATA:  Provided history: Stroke, follow-up. EXAM: MRI HEAD WITHOUT CONTRAST TECHNIQUE: Multiplanar, multiecho pulse sequences of the brain and surrounding structures were obtained without intravenous contrast. COMPARISON:  Non-contrast head CT and CT angiogram head/neck 02/26/2023. Brain MRI 02/08/2004. FINDINGS: The patient was unable to tolerate the full examination. As a result, only axial diffusion-weighted imaging could be acquired. There is no evidence of an acute infarct. IMPRESSION: The patient was unable to tolerate the full examination. Only axial diffusion-weighted imaging could be acquired. No evidence of an acute infarct. Electronically Signed   By: Jackey Loge D.O.   On: 02/26/2023 18:49   CT ANGIO HEAD NECK W WO CM W PERF (CODE STROKE)  Result Date: 02/26/2023 CLINICAL DATA:  Neuro deficit, acute, stroke suspected EXAM: EXAM CT ANGIOGRAPHY HEAD AND NECK WITH AND WITHOUT CONTRAST TECHNIQUE: TECHNIQUE  Multidetector CT imaging of the head and neck was performed using the standard protocol during bolus administration of intravenous contrast. Multiplanar CT image reconstructions and MIPs were obtained to evaluate the vascular anatomy. Carotid stenosis measurements (when applicable) are obtained utilizing NASCET criteria, using the distal internal carotid diameter as the denominator. RADIATION DOSE REDUCTION: This exam was performed according to the departmental dose-optimization program which includes automated exposure control, adjustment of the mA and/or kV according to patient size and/or use of iterative reconstruction technique. CONTRAST:  100 mL of Omnipaque 350 IV. COMPARISON:  Same day CT head. FINDINGS: CTA neck: Aorta: Great vessel origins are patent without significant stenosis. Right carotid: Patent without greater than 50% stenosis. Left carotid: Patent without greater than 50%  stenosis. Vertebral arteries: Patent bilaterally without greater than 50% stenosis. 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 Otherwise, bilateral intracranial ICAs, MCAs, and ACAs are patent without proximal hemodynamically significant stenosis. Posterior circulation: Left dominant intradural vertebral artery. Right intradural vertebral artery appears to largely terminate as PICA, anatomic variant. The vertebral arteries, basilar artery and bilateral posterior cerebral arteries are patent without proximal hemodynamically significant stenosis. Venous sinuses: Patent. Anatomic variants: See above. IMPRESSION: No emergent large vessel occlusion or proximal hemodynamically significant stenosis. Electronically Signed   By: Feliberto Harts M.D.   On: 02/26/2023 12:50   CT HEAD CODE STROKE WO CONTRAST  Result Date: 02/26/2023 CLINICAL DATA:  Provided history: Neuro deficit, acute, stroke suspected. Right-sided numbness. EXAM: CT HEAD WITHOUT CONTRAST TECHNIQUE: Contiguous axial images  were obtained from the base of the skull through the vertex without intravenous contrast. RADIATION DOSE REDUCTION: This exam was performed according to the departmental dose-optimization program which includes automated exposure control, adjustment of the mA and/or kV according to patient size and/or use of iterative reconstruction technique. COMPARISON:  Brain MRI 02/08/2004. Head CT 02/07/2004. FINDINGS: Brain: No age advanced or lobar predominant parenchymal atrophy. There is no acute intracranial hemorrhage. No demarcated cortical infarct. No extra-axial fluid collection. No evidence of an intracranial mass. No midline shift. Vascular: No hyperdense vessel. Skull: No fracture or aggressive osseous lesion. Sinuses/Orbits: No mass or acute finding within the imaged orbits. No significant paranasal sinus disease at the imaged levels. ASPECTS (Alberta Stroke Program Early CT Score) - Ganglionic level infarction (caudate, lentiform nuclei, internal capsule, insula, M1-M3 cortex): 7 - Supraganglionic infarction (M4-M6 cortex): 3 Total score (0-10 with 10 being normal): 10 These results were called by telephone at the time of interpretation on 02/26/2023 at 12:14 pm to provider [facility] , who verbally acknowledged these results. IMPRESSION: No evidence of an acute intracranial abnormality. Electronically Signed   By: Jackey Loge D.O.   On: 02/26/2023 12:16      HISTORY OF PRESENT ILLNESS  62 year old male with history of CKDIII, HTN, T2DM, HLD, and morbid obesity who presented with sudden onset right arm tingling and numbness. States he woke up feeling normal at 5AM. At 8AM, had sudden onset RUE tingling and numbness. Denies any weakness in R arm or any other extremity. Denies any slurred speech, blurry vision, headache, dizziness, or swallowing difficulties.   HOSPITAL COURSE He was given TNK at 1209 on 5/15 and transferred to ICU for post-TNK monitoring. His symptoms resolved within 15-20 minutes of  obtaining TNK and has remained symptom-free since that time. CT head was negative with ASPECTS 10. CT angio head and neck with no emergent LVO or hemodynamically significant stenosis. He was not able to tolerate MRI brain but axial DWI without any evidence of acute infarct. He feels much better this morning and remains symptom-free. Tolerated meals without difficulty. Repeat CT head showing no acute infarct or bleeding. 2D ECHO showing EF 50 to 55%.  A1c is 5.5% and LDL 35. Patient is able to ambulate with RN in the room and hallway.  He is stable for discharge today. Unclear if his symptoms could have been from peripheral nerve impingement. Will consider EMG/nerve conduction at outpatient follow up with neurology. Unable to rule out TIA or image-negative CVA (although anterior circulation imaging-negative CVA would be unusual). Will plan for DAPT x3 weeks, then ASA therapy alone. Will continue his home diabetic regimen and statin therapy at discharge. Have advised him to follow up with  his PCP and to follow up with neurology as an outpatient and consider EMG/NCS too.   DISCHARGE EXAM Blood pressure 127/69, pulse 65, temperature (!) 97.3 F (36.3 C), temperature source Axillary, resp. rate (!) 26, height 6\' 3"  (1.905 m), weight (!) 146.1 kg, SpO2 95 %. General: obese elderly male, sitting up in bed, NAD. CV: normal rate and regular rhythm, no m/r/g. Pulm: normal WOB on RA. Psych: calm and cooperative with exam. Skin: warm and dry. Neuro: awake, alert, and oriented x3. PERRL, visual fields full, EOMI, no gaze preference or nystagmus. Facial sensation symmetric. No facial droop. Shoulder shrug symmetric. Tongue protrusion midline. Strength 5/5 bilaterally throughout. Sensation intact and symmetric bilaterally throughout. Finger-to-nose testing bilaterally intact. Gait testing deferred.   Discharge Diet       Diet   Diet regular Room service appropriate? Yes; Fluid consistency: Thin    liquids  DISCHARGE PLAN Disposition:  home aspirin 81 mg daily and clopidogrel 75 mg daily for secondary stroke prevention for 3 weeks then ASA 81mg  daily alone. Ongoing stroke risk factor control by Primary Care Physician at time of discharge Follow-up PCP Corrington, Kip A, MD in 2 weeks. Follow-up in [facility] in 4 weeks, office to schedule an appointment.   40 minutes were spent preparing discharge.  Merrilyn Puma, MD [facility], PGY-3 02/27/2023, 3:13 PM  ATTENDING NOTE: I reviewed above note and agree with the assessment and plan. Pt was seen and examined.   Pt symptoms resolved about 1 hours or so after TNK, currently neuro intact. MRI negative. He stated that he had sudden onset left UE numbness only, without weakness or left leg change, speech or vision change or facial droop or HA or neck pain. Given his stroke risk factors, TIA is top on the DDx. Others including migraine equivalent, cervical radiculopathy. Recommend follow up with [facility] in 4 week for follow up and also EMG/NCS. Continue DAPT for 3 weeks and then ASA alone. Continue home statin. Pt medically stable for d/c home today.   For detailed assessment and plan, please refer to above/below as I have made changes wherever appropriate.   Marvel Plan, MD PhD Stroke Neurology 02/27/2023 4:16 PM

## 2023-02-27 NOTE — Progress Notes (Signed)
OT Cancellation Note  Patient Details Name: [name] MRN: [id number] DOB: 1961/05/13   Cancelled Treatment:    Reason Eval/Treat Not Completed: Patient at procedure or test/ unavailable. Pt being taken down to CT on arrival. Will return as schedule allows.  Tyler Deis, OTR/L [facility] Acute Rehabilitation Office: [phone]   Myrla Halsted 02/27/2023, 2:14 PM

## 2023-02-27 NOTE — Progress Notes (Signed)
PT Cancellation Note  Patient Details Name: AAYAAN AL MRN: [id number] DOB: 10/27/1960   Cancelled Treatment:    Reason Eval/Treat Not Completed: Patient at procedure or test/unavailable;Other (comment) (Mulitple attempts today, Pt at CT and then recieving Echo. Will follow up at later date/time as schedule allows andp t able.)   Renaldo Fiddler PT, DPT Acute Rehabilitation Services Office [phone]  02/27/23 3:32 PM

## 2023-02-27 NOTE — Progress Notes (Signed)
Echocardiogram 2D Echocardiogram has been performed.  [name] 02/27/2023, 3:51 PM

## 2023-02-27 NOTE — Progress Notes (Signed)
Attempted Echocardiogram, patient wants to eat, will come back.

## 2023-02-27 NOTE — Progress Notes (Signed)
Speech Language Pathology  Patient Details Name: [name] MRN: [id number] DOB: 02/15/1961 Today's Date: 02/27/2023 Time:  -     Pt screened. He denied prior or current difficulties with speech, language or cognition. Noted he had right arm tingling/numbness that has resolved (received TNK). CT No evidence of an acute intracranial abnormality. MRI The patient was unable to tolerate the full examination. Only axial diffusion-weighted imaging could be acquired. No evidence of an acute infarct.  Full assessment not warranted.   Royce Macadamia  02/27/2023, 12:15 PM

## 2023-02-28 LAB — MISC LABCORP TEST (SEND OUT): Labcorp test code: 83935

## 2023-05-01 ENCOUNTER — Encounter (HOSPITAL_COMMUNITY): Payer: Self-pay | Admitting: Emergency Medicine

## 2023-05-01 ENCOUNTER — Emergency Department (HOSPITAL_COMMUNITY)
Admission: EM | Admit: 2023-05-01 | Discharge: 2023-05-01 | Disposition: A | Discharge status: Home / Self Care | Payer: 59 | Attending: Emergency Medicine | Admitting: Emergency Medicine

## 2023-05-01 ENCOUNTER — Other Ambulatory Visit: Payer: Self-pay

## 2023-05-01 DIAGNOSIS — Z7982 Long term (current) use of aspirin: Secondary | ICD-10-CM | POA: Diagnosis not present

## 2023-05-01 DIAGNOSIS — D72829 Elevated white blood cell count, unspecified: Secondary | ICD-10-CM | POA: Diagnosis not present

## 2023-05-01 DIAGNOSIS — E1122 Type 2 diabetes mellitus with diabetic chronic kidney disease: Secondary | ICD-10-CM | POA: Insufficient documentation

## 2023-05-01 DIAGNOSIS — Z7902 Long term (current) use of antithrombotics/antiplatelets: Secondary | ICD-10-CM | POA: Diagnosis not present

## 2023-05-01 DIAGNOSIS — Z79899 Other long term (current) drug therapy: Secondary | ICD-10-CM | POA: Diagnosis not present

## 2023-05-01 DIAGNOSIS — Z7984 Long term (current) use of oral hypoglycemic drugs: Secondary | ICD-10-CM | POA: Diagnosis not present

## 2023-05-01 DIAGNOSIS — N183 Chronic kidney disease, stage 3 unspecified: Secondary | ICD-10-CM | POA: Diagnosis not present

## 2023-05-01 DIAGNOSIS — I129 Hypertensive chronic kidney disease with stage 1 through stage 4 chronic kidney disease, or unspecified chronic kidney disease: Secondary | ICD-10-CM | POA: Insufficient documentation

## 2023-05-01 DIAGNOSIS — R002 Palpitations: Secondary | ICD-10-CM | POA: Diagnosis not present

## 2023-05-01 LAB — CBC
HCT: 43 % (ref 39.0–52.0)
Hemoglobin: 14.5 g/dL (ref 13.0–17.0)
MCH: 31.1 pg (ref 26.0–34.0)
MCHC: 33.7 g/dL (ref 30.0–36.0)
MCV: 92.3 fL (ref 80.0–100.0)
Platelets: 232 10*3/uL (ref 150–400)
RBC: 4.66 MIL/uL (ref 4.22–5.81)
RDW: 13.4 % (ref 11.5–15.5)
WBC: 13.4 10*3/uL — ABNORMAL HIGH (ref 4.0–10.5)
nRBC: 0 % (ref 0.0–0.2)

## 2023-05-01 LAB — BASIC METABOLIC PANEL
Anion gap: 8 (ref 5–15)
BUN: 20 mg/dL (ref 8–23)
CO2: 23 mmol/L (ref 22–32)
Calcium: 8.8 mg/dL — ABNORMAL LOW (ref 8.9–10.3)
Chloride: 103 mmol/L (ref 98–111)
Creatinine, Ser: 1.52 mg/dL — ABNORMAL HIGH (ref 0.61–1.24)
GFR, Estimated: 51 mL/min — ABNORMAL LOW (ref >60)
Glucose, Bld: 136 mg/dL — ABNORMAL HIGH (ref 70–99)
Potassium: 3.9 mmol/L (ref 3.5–5.1)
Sodium: 134 mmol/L — ABNORMAL LOW (ref 135–145)

## 2023-05-01 LAB — TROPONIN I (HIGH SENSITIVITY)
Troponin I (High Sensitivity): 3 ng/L (ref <18)
Troponin I (High Sensitivity): 3 ng/L (ref <18)

## 2023-05-01 NOTE — Discharge Instructions (Signed)
General instructions Take over-the-counter and prescription medicines only as told by your health care provider. Keep all follow-up visits. This is important. These may include visits for further testing if palpitations do not go away or get worse. Contact a health care provider if: You continue to have a fast or irregular heartbeat for a long period of time. You notice that your palpitations occur more often. Get help right away if: You have chest pain or shortness of breath. You have a severe headache. You feel dizzy or you faint. These symptoms may represent a serious problem that is an emergency. Do not wait to see if the symptoms will go away. Get medical help right away. Call your local emergency services (911 in the [place] Do not drive yourself to the hospital.

## 2023-05-01 NOTE — ED Provider Notes (Signed)
[place] EMERGENCY DEPARTMENT AT [facility] Provider Note   CSN: [id number] Arrival date & time: 05/01/23  1243     History  Chief Complaint  Patient presents with   Palpitations    [name] is a 62 y.o. male who presents with cc of palpitations. Patient states that he was eating breakfast this morning when he had sudden onset diaphoresis followed by a fluttering in the patients chest. He denies pain, sob, or near syncope. It lasted for about 30 min and then resolved. He was able to complete adls without interruption.           Past Medical History:  Diagnosis Date   Anginal pain (HCC) 12/29/2012   CKD (chronic kidney disease) stage 3, GFR 30-59 ml/min (HCC)    Diabetes mellitus type 2, noninsulin dependent (HCC)    GERD (gastroesophageal reflux disease)    H/O hiatal hernia    Headache(784.0)    "not daily, not weekly, but very often" (12/30/2012)   HTN (hypertension)    Hyperlipidemia    Morbid obesity (HCC)    Osteoarthritis    "hands" (12/30/2012)           Palpitations      Home Medications Prior to Admission medications   Medication Sig Start Date End Date Taking? Authorizing Provider  acetaminophen (TYLENOL) 500 MG tablet Take 1,000 mg by mouth every 6 (six) hours as needed for mild pain.    [provider]  aspirin EC 81 MG tablet Take 1 tablet (81 mg total) by mouth daily. 04/19/21   Jene Every, MD  clopidogrel (PLAVIX) 75 MG tablet Take 1 tablet (75 mg total) by mouth daily. 02/27/23   Merrilyn Puma, MD  docusate sodium (COLACE) 100 MG capsule Take 1 capsule (100 mg total) by mouth 2 (two) times daily. 04/18/21   Jene Every, MD  famotidine (PEPCID) 20 MG tablet Take 20 mg by mouth daily. 08/17/20   [provider]  FLUoxetine (PROZAC) 20 MG capsule Take 20 mg by mouth daily.    [provider]  furosemide (LASIX) 40 MG tablet Take 40 mg by mouth daily as needed for fluid. 01/20/18   [provider]  gabapentin (NEURONTIN) 100 MG capsule Take 100 mg by mouth daily. 03/01/20   [provider]  JARDIANCE 25 MG TABS tablet Take 25 mg by mouth daily. 03/14/21   [provider]  Multiple Vitamin (MULTIVITAMIN WITH MINERALS) TABS tablet Take 1 tablet by mouth daily.    [provider]  Olmesartan-amLODIPine-HCTZ 20-5-12.5 MG TABS Take 1 tablet by mouth daily.    [provider]  OZEMPIC, 2 MG/DOSE, 8 MG/3ML SOPN Inject 2 mg into the skin every Friday. 04/10/21   [provider]  rosuvastatin (CRESTOR) 20 MG tablet Take 20 mg by mouth daily.    [provider]  TOUJEO MAX SOLOSTAR 300 UNIT/ML Solostar Pen Inject 80 Units into the skin every morning. 02/22/21   [provider]  Vitamin D, Ergocalciferol, (DRISDOL) 1.25 MG (50000 UNIT) CAPS capsule Take 50,000 Units by mouth every Monday.    [provider]      Allergies    Quinolones, Tequin [gatifloxacin], and Metformin and related    Review of Systems   Review of Systems  Cardiovascular:  Positive for palpitations.    Physical Exam Updated Vital Signs BP 131/61   Pulse 78   Temp 98 F (36.7 C)   Resp 20   Ht 6'  3" (1.905 m)   Wt (!) 147.4 kg   SpO2 95%   BMI 40.62 kg/m  Physical Exam Vitals and nursing note reviewed.  Constitutional:      General: He is not in acute distress.    Appearance: He is well-developed. He is not diaphoretic.  HENT:     Head: Normocephalic and atraumatic.  Eyes:     General: No scleral icterus.    Conjunctiva/sclera: Conjunctivae normal.  Cardiovascular:     Rate and Rhythm: Normal rate and regular rhythm.     Heart sounds: Normal heart sounds.  Pulmonary:     Effort: Pulmonary effort is normal. No respiratory distress.     Breath sounds: Normal breath sounds. No rales.  Abdominal:     General: Abdomen is protuberant.     Palpations: Abdomen is soft.     Tenderness: There is no abdominal tenderness.   Musculoskeletal:     Cervical back: Normal range of motion and neck supple.     Right lower leg: No edema.     Left lower leg: No edema.  Skin:    General: Skin is warm and dry.  Neurological:     Mental Status: He is alert.  Psychiatric:        Behavior: Behavior normal.     ED Results / Procedures / Treatments   Labs (all labs ordered are listed, but only abnormal results are displayed) Labs Reviewed  BASIC METABOLIC PANEL - Abnormal; Notable for the following components:      Result Value   Sodium 134 (*)    Glucose, Bld 136 (*)    Creatinine, Ser 1.52 (*)    Calcium 8.8 (*)    GFR, Estimated 51 (*)    All other components within normal limits  CBC - Abnormal; Notable for the following components:   WBC 13.4 (*)    All other components within normal limits  TROPONIN I (HIGH SENSITIVITY)  TROPONIN I (HIGH SENSITIVITY)    EKG EKG Interpretation Date/Time:  Thursday May 01 2023 13:15:57 EDT Ventricular Rate:  76 PR Interval:  179 QRS Duration:  98 QT Interval:  353 QTC Calculation: 397 R Axis:   24  Text Interpretation: Sinus rhythm Low voltage, precordial leads Abnormal R-wave progression, early transition Confirmed by Bethann Berkshire [phone]) on 05/01/2023 3:20:27 PM  Radiology No results found.  Procedures Procedures    Medications Ordered in ED Medications - No data to display  ED Course/ Medical Decision Making/ A&P Clinical Course as of 05/01/23 1643  Thu May 01, 2023  1515 WBC(!): 13.4 Leukocytosis of unknown etiology.  [AH]  1515 Troponin I (High Sensitivity) Negative troponin - delta pending [AH]  1517 ED EKG Nsr  [AH]    Clinical Course User Index [AH] Arthor Captain, PA-C                             Medical Decision Making Amount and/or Complexity of Data Reviewed Labs: ordered. Decision-making details documented in ED Course. ECG/medicine tests:  Decision-making details documented in ED Course.   This patient presents to the ED  for concern of palpitations, this involves an extensive number of treatment options, and is a complaint that carries with it a high risk of complications and morbidity.  The differential diagnosis for palpitations includes cardiac arrhythmias, PVC/PAC, ACS, Cardiomyopathy, CHF, MVP, pericarditis, valvular disease, Panic/Anxiety, Somatic disorder, ETOH, Caffeine,  Stimulant use, medication side effect, Anemia, Hyperthyroidism, pulmonary  embolism.   Co morbidities: past medical history of Anginal pain (HCC) (12/29/2012), CKD (chronic kidney disease) stage 3, GFR 30-59 ml/min (HCC), Diabetes mellitus type 2, noninsulin dependent (HCC), GERD (gastroesophageal reflux disease), H/O hiatal hernia, Headache(784.0), HTN (hypertension), Hyperlipidemia, Morbid obesity (HCC), and Osteoarthritis.   Social Determinants of Health:   SDOH Screenings   Food Insecurity: Food Insecurity Present (02/24/2023)   Received from [facility], [facility], [facility]  Transportation Needs: No Transportation Needs (02/24/2023)   Received from [facility], [facility], [facility]  Utilities: Not At Risk (02/24/2023)   [facility], [facility]  Financial Resource Strain: Medium Risk (02/24/2023)   Received from [facility], [facility], [facility]  Physical Activity: Insufficiently Active (02/24/2023)   [facility] from [facility], [facility]  Social Connections: Socially Integrated (02/24/2023)   Received from [facility], [facility], [facility]  Stress: No Stress Concern Present (02/24/2023)   [facility], [facility]  Tobacco Use: Medium Risk (05/01/2023)     Additional history:  {Additional history obtained from emr   Lab Tests:  I Ordered, and personally interpreted labs.  The pertinent results include:  troponin neg x 2  Remainder of labs per ED course  Imaging Studies:  I ordered imaging  studies including cxr  Cardiac Monitoring/ECG:  The patient was maintained on a cardiac monitor.  I personally viewed and interpreted the cardiac monitored which showed an underlying rhythm of:  Normal sinus rhythm Medicines ordered and prescription drug management:   I have reviewed the patients home medicines and have made adjustments as needed  Test Considered:   I considered potential for D-dimer however patient has no chest pain shortness of breath or near syncope.  Critical Interventions:    Consultations Obtained:   Problem List / ED Course:     ICD-10-CM   1. Palpitations  R00.2 Ambulatory referral to Cardiology      MDM:   Your palpitations.  He was monitored in the emergency department for greater than 3 hours without any recurrence of symptoms.  Monitoring was normal.  Patient's workup is reassuring shows no signs of ACS.  Patient given cardiology referral.  Appropriate for discharge at this time Dispostion:  After consideration of the diagnostic results and the patients response to treatment, I feel that the patent would benefit from discharge.         Final Clinical Impression(s) / ED Diagnoses Final diagnoses:  Palpitations    Rx / DC Orders ED Discharge Orders          Ordered    Ambulatory referral to Cardiology       Comments: If you have not heard from the Cardiology office within the next 72 hours please call [phone].   05/01/23 1637              Arthor Captain, PA-C 05/02/23 1729    Linwood Dibbles, MD 05/05/23 [phone]

## 2023-05-01 NOTE — ED Triage Notes (Signed)
Pt to ER via EMS form home with c/o palpitations that subsided spontaneously.  Pt denies CP, SHOB or other c/o at this time.

## 2023-05-07 NOTE — Progress Notes (Signed)
[facility] [address] [place] [place] [address] [phone]       HOSPITAL FOLLOW UP NOTE  [name] [facility] Date of Birth:  November 04, 1960 Medical Record Number:  [id number]   Reason for Referral:  hospital stroke follow up    SUBJECTIVE:   CHIEF COMPLAINT:  No chief complaint on file.   HPI:   [name] is a 62 year old male with history of CKDIII, HTN, T2DM, HLD, and morbid obesity who presented on 02/26/2023 with sudden onset right arm tingling and numbness.  Received TNK with resolution of symptoms within 15 to 20 minutes.  CTH and CTA head/neck unremarkable.  Difficulty tolerating full MRI exam but axial DWI without evidence of acute infarct.  EF 50 to 55%.  LDL 35.  A1c 5.5.  Recommended DAPT for 3 weeks and aspirin alone and ongoing use of Crestor. Per Dr. Roda Shutters, given stroke risk factors TIA top of DDx, others including migraine equivalent vs cervical radiculopathy.  Recommended consideration of EMG/NCS outpatient.        PERTINENT IMAGING  CT head -no acute abnormality CTA head/neck -negative LVO or proximal hemodynamically significant stenosis MRI brain - The patient was unable to tolerate the full examination. Only axial diffusion-weighted imaging could be acquired. No evidence of an acute infarct. CT head repeat -negative 2D echo 50 to 55%    ROS:   14 system review of systems performed and negative with exception of ***  PMH:  Past Medical History:  Diagnosis Date   Anginal pain (HCC) 12/29/2012   CKD (chronic kidney disease) stage 3, GFR 30-59 ml/min (HCC)    Diabetes mellitus type 2, noninsulin dependent (HCC)    GERD (gastroesophageal reflux disease)    H/O hiatal hernia    Headache(784.0)    "not daily, not weekly, but very often" (12/30/2012)   HTN (hypertension)    Hyperlipidemia    Morbid obesity (HCC)    Osteoarthritis    "hands" (12/30/2012)    PSH:  Past Surgical History:  Procedure Laterality Date    CARPAL TUNNEL RELEASE Left 01/08/2018   Procedure: LEFT CARPAL TUNNEL RELEASE;  Surgeon: Cindee Salt, MD;  Location: [facility];  Service: Orthopedics;  Laterality: Left;  axillary block   ELBOW ARTHROPLASTY Left 1990's   "3 spurs; ?14 staples" (12/30/2012)   KNEE ARTHROSCOPY WITH SUBCHONDROPLASTY Right 04/18/2021   Procedure: KNEE ARTHROSCOPY WITH PARTIAL MEDIAL AND LATERAL MENISCECTOMY AND DEBRIDEMENT,MICROFRACTURE medial tibial plateau;  Surgeon: Jene Every, MD;  Location: [facility];  Service: Orthopedics;  Laterality: Right;    LEFT HEART CATHETERIZATION WITH CORONARY ANGIOGRAM N/A 12/31/2012   Procedure: LEFT HEART CATHETERIZATION WITH CORONARY ANGIOGRAM;  Surgeon: Tonny Bollman, MD;  Location: [facility] C[facility];  Service: Cardiovascular;  Laterality: N/A;   LUMBAR DISC SURGERY  2010; 2011   "2 ruptured discs; 2 pinched nerves"   SHOULDER SURGERY Right    ULNAR NERVE TRANSPOSITION Left 01/08/2018   Procedure: DECOMPRESSION LEFT ULNAR AT ELBOW;  Surgeon: Cindee Salt, MD;  Location: [facility];  Service: Orthopedics;  Laterality: Left;  axillary block    Social History:  Social History   Socioeconomic History   Marital status: Divorced    Spouse name: Not on file   Number of children: Not on file   Years of education: Not on file   Highest education level: Not on file  Occupational History   Occupation: disabled  Tobacco Use   Smoking status: Former    Current  packs/day: 0.00    Average packs/day: 1.5 packs/day for 8.0 years (12.0 ttl pk-yrs)    Types: Cigarettes    Start date: 10/14/1974    Quit date: 10/14/1982    Years since quitting: 40.5   Smokeless tobacco: Former    Types: Snuff    Quit date: 10/14/1982   Tobacco comments:    12/30/2012 "I was about 62yr old when I quit smoking cigarettes & chewing snuff"  Vaping Use   Vaping status: Never Used  Substance and Sexual Activity   Alcohol use: No   Drug use: No   Sexual activity: Not Currently   Other Topics Concern   Not on file  Social History Narrative   Not on file   Social Determinants of Health   Financial Resource Strain: Medium Risk (02/24/2023)   [facility]   Overall Financial Resource Strain (CARDIA)    Difficulty of Paying Living Expenses: Somewhat hard  Food Insecurity: Food Insecurity Present (02/24/2023)   [facility], [facility]   Hunger Vital Sign    Worried About Running Out of Food in the Last Year: Sometimes true    Ran Out of Food in the Last Year: Sometimes true  Transportation Needs: No Transportation Needs (02/24/2023)   Received from [employer], [facility]   PRAPARE - Transportation    Lack of Transportation (Medical): No    Lack of Transportation (Non-Medical): No  Physical Activity: Insufficiently Active (02/24/2023)   [facility], [facility]   Exercise Vital Sign    Days of Exercise per Week: 3 days    Minutes of Exercise per Session: 20 min  Stress: No Stress Concern Present (02/24/2023)   Received from [facility], [facility], [facility]   [employer] of Occupational Health - Occupational Stress Questionnaire    Feeling of Stress : Not at all  Social Connections: Socially Integrated (02/24/2023)   [facility], [facility]   Social Network    How would you rate your social network (family, work, friends)?: Good participation with social networks  Intimate Partner Violence: Not At Risk (02/24/2023)   [facility], [facility]   HITS    Over the last 12 months how often did your partner physically hurt you?: 1    Over the last 12 months how often did your partner insult you or talk down to you?: 1    Over the last 12 months how often did your partner threaten you with physical harm?: 1    Over the last 12 months how often did your  partner scream or curse at you?: 1    Family History: No family history on file.  Medications:   Current Outpatient Medications on File Prior to Visit  Medication Sig Dispense Refill   acetaminophen (TYLENOL) 500 MG tablet Take 1,000 mg by mouth every 6 (six) hours as needed for mild pain.     aspirin EC 81 MG tablet Take 1 tablet (81 mg total) by mouth daily. 30 tablet 1   clopidogrel (PLAVIX) 75 MG tablet Take 1 tablet (75 mg total) by mouth daily. 21 tablet 0   docusate sodium (COLACE) 100 MG capsule Take 1 capsule (100 mg total) by mouth 2 (two) times daily. 10 capsule 0   famotidine (PEPCID) 20 MG tablet Take 20 mg by mouth daily.     FLUoxetine (PROZAC) 20  MG capsule Take 20 mg by mouth daily.     furosemide (LASIX) 40 MG tablet Take 40 mg by mouth daily as needed for fluid.     gabapentin (NEURONTIN) 100 MG capsule Take 100 mg by mouth daily.     JARDIANCE 25 MG TABS tablet Take 25 mg by mouth daily.     Multiple Vitamin (MULTIVITAMIN WITH MINERALS) TABS tablet Take 1 tablet by mouth daily.     Olmesartan-amLODIPine-HCTZ 20-5-12.5 MG TABS Take 1 tablet by mouth daily.     OZEMPIC, 2 MG/DOSE, 8 MG/3ML SOPN Inject 2 mg into the skin every Friday.     rosuvastatin (CRESTOR) 20 MG tablet Take 20 mg by mouth daily.     TOUJEO MAX SOLOSTAR 300 UNIT/ML Solostar Pen Inject 80 Units into the skin every morning.     Vitamin D, Ergocalciferol, (DRISDOL) 1.25 MG (50000 UNIT) CAPS capsule Take 50,000 Units by mouth every Monday.     No current facility-administered medications on file prior to visit.    Allergies:   Allergies  Allergen Reactions   Quinolones Rash    Other reaction(s): Other (See Comments) "my mouth broke out" "my mouth broke out"    Tequin [Gatifloxacin] Other (See Comments)    "my mouth broke out"   Metformin And Related Nausea Only      OBJECTIVE:  Physical Exam  There were no vitals filed for this visit. There is no height or weight on file to calculate  BMI. No results found.      No data to display           General: well developed, well nourished, seated, in no evident distress Head: head normocephalic and atraumatic.   Neck: supple with no carotid or supraclavicular bruits Cardiovascular: regular rate and rhythm, no murmurs Musculoskeletal: no deformity Skin:  no rash/petichiae Vascular:  Normal pulses all extremities   Neurologic Exam Mental Status: Awake and fully alert. Oriented to place and time. Recent and remote memory intact. Attention span, concentration and fund of knowledge appropriate. Mood and affect appropriate.  Cranial Nerves: Fundoscopic exam reveals sharp disc margins. Pupils equal, briskly reactive to light. Extraocular movements full without nystagmus. Visual fields full to confrontation. Hearing intact. Facial sensation intact. Face, tongue, palate moves normally and symmetrically.  Motor: Normal bulk and tone. Normal strength in all tested extremity muscles Sensory.: intact to touch , pinprick , position and vibratory sensation.  Coordination: Rapid alternating movements normal in all extremities. Finger-to-nose and heel-to-shin performed accurately bilaterally. Gait and Station: Arises from chair without difficulty. Stance is normal. Gait demonstrates normal stride length and balance with ***. Tandem walk and heel toe ***.  Reflexes: 1+ and symmetric. Toes downgoing.     NIHSS  *** Modified Rankin  ***      ASSESSMENT: [name] is a 62 y.o. year old male who presented to ED on 02/26/2023 with sudden onset *** arm numbness/tingling s/p TNK, possible TIA DDx including cervical radiculopathy and migraine equivalent. Vascular risk factors include HTN, HLD, DM and morbid obesity.      PLAN:  Strokelike episode:  Residual deficit: ***.  Continue {anticoagluation:28091} and {statin:28096} for secondary stroke prevention.   Discussed secondary stroke prevention measures and importance of close  PCP follow up for aggressive stroke risk factor management including BP goal<130/90, HLD with LDL goal<70 and DM with A1c.<7 .  Stroke labs ***: LDL ***, A1c *** I have gone over the pathophysiology of stroke, warning signs and symptoms, risk  factors and their management in some detail with instructions to go to the closest emergency room for symptoms of concern.     Follow up in *** or call earlier if needed   CC:  [facility] provider: Dr. Pearlean Brownie PCP: Corrington, Kip A, MD    I spent *** minutes of face-to-face and non-face-to-face time with patient.  This included previsit chart review including review of recent hospitalization, lab review, study review, order entry, electronic health record documentation, patient education regarding recent stroke including etiology, secondary stroke prevention measures and importance of managing stroke risk factors, residual deficits and typical recovery time and answered all other questions to patient satisfaction   Ihor Austin, AGNP-BC  [facility] [address] [place], [place] [address]  Phone [phone] Fax [phone] Note: This document was prepared with digital dictation and possible smart phrase technology. Any transcriptional errors that result from this process are unintentional.

## 2023-05-08 ENCOUNTER — Ambulatory Visit (INDEPENDENT_AMBULATORY_CARE_PROVIDER_SITE_OTHER): Payer: 59 | Admitting: Adult Health

## 2023-05-08 ENCOUNTER — Encounter: Payer: Self-pay | Admitting: Adult Health

## 2023-05-08 VITALS — BP 122/77 | HR 72 | Ht 75.0 in | Wt 329.2 lb

## 2023-05-08 DIAGNOSIS — R2990 Unspecified symptoms and signs involving the nervous system: Secondary | ICD-10-CM | POA: Diagnosis not present

## 2023-05-08 DIAGNOSIS — E538 Deficiency of other specified B group vitamins: Secondary | ICD-10-CM

## 2023-05-08 DIAGNOSIS — R29818 Other symptoms and signs involving the nervous system: Secondary | ICD-10-CM | POA: Diagnosis not present

## 2023-05-08 DIAGNOSIS — G4719 Other hypersomnia: Secondary | ICD-10-CM

## 2023-05-08 DIAGNOSIS — R002 Palpitations: Secondary | ICD-10-CM

## 2023-05-08 NOTE — Patient Instructions (Addendum)
You will be called to schedule a sleep evaluation to evaluate for sleep apnea  Will check lab work today to look for other causes of fatigue  Please call with any reoccurrence of arm numbness/tingling - can consider doing nerve conduction study to further evaluation. If comes on suddenly and especially with weakness, please call [phone] for further evaluation   Continue aspirin 81 mg daily  and Crestor  for secondary stroke prevention  Continue to follow up with PCP and endocrinology regarding cholesterol, diabetes and blood pressure management  Maintain strict control of hypertension with blood pressure goal below 130/90, diabetes with hemoglobin A1c goal below 7.0 % and cholesterol with LDL cholesterol (bad cholesterol) goal below 70 mg/dL.   Signs of a Stroke? Follow the BEFAST method:  Balance Watch for a sudden loss of balance, trouble with coordination or vertigo Eyes Is there a sudden loss of vision in one or both eyes? Or double vision?  Face: Ask the person to smile. Does one side of the face droop or is it numb?  Arms: Ask the person to raise both arms. Does one arm drift downward? Is there weakness or numbness of a leg? Speech: Ask the person to repeat a simple phrase. Does the speech sound slurred/strange? Is the person confused ? Time: If you observe any of these signs, call 911.        Thank you for coming to see [place] at [facility]. I hope we have been able to provide you high quality care today.  You may receive a patient satisfaction survey over the next few weeks. We would appreciate your feedback and comments so that we may continue to improve ourselves and the health of our patients.

## 2023-06-17 ENCOUNTER — Ambulatory Visit (INDEPENDENT_AMBULATORY_CARE_PROVIDER_SITE_OTHER): Payer: 59 | Admitting: Neurology

## 2023-06-17 ENCOUNTER — Encounter: Payer: Self-pay | Admitting: Neurology

## 2023-06-17 VITALS — BP 114/74 | HR 76 | Ht 75.0 in | Wt 327.0 lb

## 2023-06-17 DIAGNOSIS — G4719 Other hypersomnia: Secondary | ICD-10-CM

## 2023-06-17 DIAGNOSIS — Z8673 Personal history of transient ischemic attack (TIA), and cerebral infarction without residual deficits: Secondary | ICD-10-CM

## 2023-06-17 DIAGNOSIS — R0683 Snoring: Secondary | ICD-10-CM

## 2023-06-17 DIAGNOSIS — Z9189 Other specified personal risk factors, not elsewhere classified: Secondary | ICD-10-CM

## 2023-06-17 DIAGNOSIS — R351 Nocturia: Secondary | ICD-10-CM

## 2023-06-17 NOTE — Patient Instructions (Signed)

## 2023-06-17 NOTE — Progress Notes (Signed)
Subjective:    Patient ID: [name], male    DOB: Sep 15, 1961, 61 y.o.   MRN: [id number]  HPI    Russell Foley, MD, PhD [facility] [address], [address] [address] [place], [place] [address]  Dear [name],  I saw your patient, [name], upon your kind request and ask sleep clinic today for initial consultation of his sleep disorder, in particular, concern for underlying obstructive sleep apnea.  The patient is unaccompanied today.  As you know, [name] is a 62 year old male with an underlying medical history of suspected TIA in May 2024, hypertension, hyperlipidemia, angina, diabetes, chronic kidney disease, reflux disease, headaches, osteoarthritis with status post multiple surgeries including left ulnar nerve transposition, left-sided carpal tunnel surgery, left total knee replacement, right shoulder surgery, and severe obesity with a BMI of over 40, who reports snoring and excessive daytime somnolence.  His Epworth sleepiness score is 10 out of 24, fatigue severity score is 12 out of 63.  He is retired, he used to be a Public librarian.  He is single and lives alone, no children, no pets in the house.  He has a TV in the bedroom and puts it on a 1 hour time or typically, he reports that he does not actually watch it but listens to it.  Bedtime is generally around 1030 and rise time between 5 and 7 AM.  He has nocturia about twice per average night and denies recurrent nocturnal or morning headaches.  He is not aware of any family history of sleep apnea.  He has limited his caffeine to 1 cup of coffee in the morning.  He does not currently drink any alcohol, he quit smoking some 25 years ago and has not had any alcohol in about the same time.  His weight has been fluctuating, he is working on weight loss by walking a little more, he has lost a few pounds thus far.  He is followed by cardiology, he is currently on a heart monitor and has a stress test coming up next  week.  I reviewed your office note from 05/08/2023.  His Past Medical History Is Significant For: Past Medical History:  Diagnosis Date   Anginal pain (HCC) 12/29/2012   CKD (chronic kidney disease) stage 3, GFR 30-59 ml/min (HCC)    Diabetes mellitus type 2, noninsulin dependent (HCC)    GERD (gastroesophageal reflux disease)    H/O hiatal hernia    Headache(784.0)    "not daily, not weekly, but very often" (12/30/2012)   HTN (hypertension)    Hyperlipidemia    Morbid obesity (HCC)    Osteoarthritis    "hands" (12/30/2012)    His Past Surgical History Is Significant For: Past Surgical History:  Procedure Laterality Date   CARPAL TUNNEL RELEASE Left 01/08/2018   Procedure: LEFT CARPAL TUNNEL RELEASE;  Surgeon: Cindee Salt, MD;  Location: [facility];  Service: Orthopedics;  Laterality: Left;  axillary block   ELBOW ARTHROPLASTY Left 1990's   "3 spurs; ?14 staples" (12/30/2012)   KNEE ARTHROSCOPY WITH SUBCHONDROPLASTY Right 04/18/2021   Procedure: KNEE ARTHROSCOPY WITH PARTIAL MEDIAL AND LATERAL MENISCECTOMY AND DEBRIDEMENT,MICROFRACTURE medial tibial plateau;  Surgeon: Jene Every, MD;  Location: [facility];  Service: Orthopedics;  Laterality: Right;    LEFT HEART CATHETERIZATION WITH CORONARY ANGIOGRAM N/A 12/31/2012   Procedure: LEFT HEART CATHETERIZATION WITH CORONARY ANGIOGRAM;  Surgeon: Tonny Bollman, MD;  Location: [facility];  Service: Cardiovascular;  Laterality: N/A;  LUMBAR DISC SURGERY  2010; 2011   "2 ruptured discs; 2 pinched nerves"   REPLACEMENT TOTAL KNEE Left 08/2022   SHOULDER SURGERY Right    ULNAR NERVE TRANSPOSITION Left 01/08/2018   Procedure: DECOMPRESSION LEFT ULNAR AT ELBOW;  Surgeon: Cindee Salt, MD;  Location: [facility];  Service: Orthopedics;  Laterality: Left;  axillary block    His Family History Is Significant For: History reviewed. No pertinent family history.  His Social History Is Significant For: Social  History   Socioeconomic History   Marital status: Divorced    Spouse name: Not on file   Number of children: Not on file   Years of education: Not on file   Highest education level: Not on file  Occupational History   Occupation: disabled  Tobacco Use   Smoking status: Former    Current packs/day: 0.00    Average packs/day: 1.5 packs/day for 8.0 years (12.0 ttl pk-yrs)    Types: Cigarettes    Start date: 10/14/1974    Quit date: 10/14/1982    Years since quitting: 40.7   Smokeless tobacco: Former    Types: Snuff    Quit date: 10/14/1982   Tobacco comments:    12/30/2012 "I was about 61yr old when I quit smoking cigarettes & chewing snuff"  Vaping Use   Vaping status: Never Used  Substance and Sexual Activity   Alcohol use: No   Drug use: No   Sexual activity: Not Currently  Other Topics Concern   Not on file  Social History Narrative   Not on file   Social Determinants of Health   Financial Resource Strain: Medium Risk (02/24/2023)   [facility]   Overall Financial Resource Strain (CARDIA)    Difficulty of Paying Living Expenses: Somewhat hard  Food Insecurity: Food Insecurity Present (02/24/2023)   [facility]   Hunger Vital Sign    Worried About Running Out of Food in the Last Year: Sometimes true    Ran Out of Food in the Last Year: Sometimes true  Transportation Needs: No Transportation Needs (02/24/2023)   [facility] [employer], [facility]   PRAPARE - Transportation    Lack of Transportation (Medical): No    Lack of Transportation (Non-Medical): No  Physical Activity: Insufficiently Active (02/24/2023)   [facility]   Exercise Vital Sign    Days of Exercise per Week: 3 days    Minutes of Exercise per Session: 20 min  Stress: No Stress Concern Present (02/24/2023)   Received from [facility], [facility],  [facility] - Occupational Stress Questionnaire    Feeling of Stress : Not at all  Social Connections: Socially Integrated (02/24/2023)   [facility]   Social Network    How would you rate your social network (family, work, friends)?: Good participation with social networks    His Allergies Are:  Allergies  Allergen Reactions   Quinolones Rash    Other reaction(s): Other (See Comments) "my mouth broke out" "my mouth broke out"    Tequin [Gatifloxacin] Other (See Comments)    "my mouth broke out"   Metformin And Related Nausea Only  :   His Current Medications Are:  Outpatient Encounter Medications as of 06/17/2023  Medication Sig   acetaminophen (TYLENOL) 500 MG tablet Take 1,000 mg by  mouth every 6 (six) hours as needed for mild pain.   Ascorbic Acid (VITAMIN C) 1000 MG tablet Take 1,000 mg by mouth 2 (two) times daily.   aspirin EC 81 MG tablet Take 1 tablet (81 mg total) by mouth daily.   docusate sodium (COLACE) 100 MG capsule Take 1 capsule (100 mg total) by mouth 2 (two) times daily.   famotidine (PEPCID) 20 MG tablet Take 20 mg by mouth daily.   FLUoxetine (PROZAC) 20 MG capsule Take 20 mg by mouth daily.   gabapentin (NEURONTIN) 100 MG capsule Take 100 mg by mouth daily.   JARDIANCE 25 MG TABS tablet Take 25 mg by mouth daily.   Multiple Vitamin (MULTIVITAMIN WITH MINERALS) TABS tablet Take 1 tablet by mouth daily.   olmesartan (BENICAR) 20 MG tablet Take 20 mg by mouth daily.   OZEMPIC, 2 MG/DOSE, 8 MG/3ML SOPN Inject 2 mg into the skin every Friday.   rosuvastatin (CRESTOR) 20 MG tablet Take 20 mg by mouth daily.   TOUJEO MAX SOLOSTAR 300 UNIT/ML Solostar Pen Inject 34 Units into the skin 2 (two) times daily.   Vitamin D, Ergocalciferol, (DRISDOL) 1.25 MG (50000 UNIT) CAPS capsule Take 50,000 Units by mouth every Monday.   [DISCONTINUED] furosemide (LASIX) 40 MG tablet Take 40 mg  by mouth daily as needed for fluid.   [DISCONTINUED] Olmesartan-amLODIPine-HCTZ 20-5-12.5 MG TABS Take 1 tablet by mouth daily.   No facility-administered encounter medications on file as of 06/17/2023.  :   Review of Systems:  Out of a complete 14 point review of systems, all are reviewed and negative with the exception of these symptoms as listed below:   Review of Systems  Neurological:        Rm 5. Alone. NP / Internal; MCCUE / daytime fatigue, also c/o insomnia, snoring, and occasional morning headaches. - No prior SS       Objective:   Physical Exam  Physical Examination:   Vitals:   06/17/23 1308  BP: 114/74  Pulse: 76    General Examination: The patient is a very pleasant 62 y.o. male in no acute distress. He appears well-developed and well-nourished and well groomed.   HEENT: Normocephalic, atraumatic, pupils are equal, round and reactive to light, extraocular tracking is good without limitation to gaze excursion or nystagmus noted. Hearing is grossly intact. Face is symmetric with normal facial animation. Speech is clear with no dysarthria noted. There is no hypophonia. There is no lip, neck/head, jaw or voice tremor. Neck is supple with full range of passive and active motion. There are no carotid bruits on auscultation. Oropharynx exam reveals: moderate mouth dryness, marginal dental hygiene with nearly edentulous state, partial dentures on the bottom and no dentures on the top.  Moderate airway crowding secondary to small airway entry and redundant soft palate, Mallampati class II, tonsils on the smaller side, neck circumference 19 inches.   Chest: Clear to auscultation without wheezing, rhonchi or crackles noted.  Heart: S1+S2+0, regular and normal without murmurs, rubs or gallops noted.   Abdomen: Soft, non-tender and non-distended.  Extremities: There is no pitting edema in the distal lower extremities bilaterally.   Skin: Warm and dry without trophic changes  noted.   Musculoskeletal: exam reveals no obvious joint deformities.   Neurologically:  Mental status: The patient is awake, alert and oriented in all 4 spheres. His immediate and remote memory, attention, language skills and fund of knowledge are appropriate. There is no evidence of aphasia, agnosia, apraxia  or anomia. Speech is clear with normal prosody and enunciation. Thought process is linear. Mood is normal and affect is normal.  Cranial nerves II - XII are as described above under HEENT exam.  Motor exam: Normal bulk, strength and tone is noted. There is no obvious action or resting tremor.  Fine motor skills and coordination: grossly intact.  Cerebellar testing: No dysmetria or intention tremor. There is no truncal or gait ataxia.  Sensory exam: intact to light touch in the upper and lower extremities.  Gait, station and balance: He stands easily. No veering to one side is noted. No leaning to one side is noted. Posture is age-appropriate and stance is narrow based. Gait shows normal stride length and normal pace. No problems turning are noted.        Assessment & Plan:  In summary, [name] is a very pleasant 62 y.o.-year old male ith an underlying medical history of suspected TIA in May 2024, hypertension, hyperlipidemia, angina, diabetes, chronic kidney disease, reflux disease, headaches, osteoarthritis with status post multiple surgeries including left ulnar nerve transposition, left-sided carpal tunnel surgery, left total knee replacement, right shoulder surgery, and severe obesity with a BMI of over 40, whose history and physical exam are concerning for sleep disordered breathing, particularly obstructive sleep apnea (OSA). A laboratory attended sleep study is typically considered "gold standard" for evaluation of sleep disordered breathing.   I had a long chat with the patient about my findings and the diagnosis of sleep apnea, particularly OSA, its prognosis and treatment  options. We talked about medical/conservative treatments, surgical interventions and non-pharmacological approaches for symptom control. I explained, in particular, the risks and ramifications of untreated moderate to severe OSA, especially with respect to developing cardiovascular disease down the road, including congestive heart failure (CHF), difficult to treat hypertension, cardiac arrhythmias (particularly A-fib), neurovascular complications including TIA, stroke and dementia. Even type 2 diabetes has, in part, been linked to untreated OSA. Symptoms of untreated OSA may include (but may not be limited to) daytime sleepiness, nocturia (i.e. frequent nighttime urination), memory problems, mood irritability and suboptimally controlled or worsening mood disorder such as depression and/or anxiety, lack of energy, lack of motivation, physical discomfort, as well as recurrent headaches, especially morning or nocturnal headaches. We talked about the importance of maintaining a healthy lifestyle and striving for healthy weight. In addition, we talked about the importance of striving for and maintaining good sleep hygiene. I recommended a sleep study at this time. I outlined the differences between a laboratory attended sleep study which is considered more comprehensive and accurate over the option of a home sleep test (HST); the latter may lead to underestimation of sleep disordered breathing in some instances and does not help with diagnosing upper airway resistance syndrome and is not accurate enough to diagnose primary central sleep apnea typically. I outlined possible surgical and non-surgical treatment options of OSA, including the use of a positive airway pressure (PAP) device (i.e. CPAP, AutoPAP/APAP or BiPAP in certain circumstances), a custom-made dental device (aka oral appliance, which would require a referral to a specialist dentist or orthodontist typically, and is generally speaking not considered for  patients with full dentures or edentulous state), upper airway surgical options, such as traditional UPPP (which is not considered a first-line treatment) or the Inspire device (hypoglossal nerve stimulator, which would involve a referral for consultation with an ENT surgeon, after careful selection, following inclusion criteria - also not first-line treatment). I explained the PAP treatment option to  the patient in detail, as this is generally considered first-line treatment.  The patient indicated that he would be willing to try PAP therapy, if the need arises. I explained the importance of being compliant with PAP treatment, not only for insurance purposes but primarily to improve patient's symptoms symptoms, and for the patient's long term health benefit, including to reduce His cardiovascular risks longer-term.    We will pick up our discussion about the next steps and treatment options after testing.  We will keep him posted as to the test results by phone call and/or MyChart messaging where possible.  We will plan to follow-up in sleep clinic accordingly as well.  I answered all his questions today and the patient was in agreement.   I encouraged him to call with any interim questions, concerns, problems or updates or email [place] through MyChart.  Generally speaking, sleep test authorizations may take up to 2 weeks, sometimes less, sometimes longer, the patient is encouraged to get in touch with [place] if they do not hear back from the sleep lab staff directly within the next 2 weeks.  Thank you very much for allowing me to participate in the care of this nice patient. If I can be of any further assistance to you please do not hesitate to talk to me.   Sincerely,   Russell Foley, MD, PhD

## 2023-06-18 ENCOUNTER — Telehealth: Payer: Self-pay | Admitting: Neurology

## 2023-06-18 NOTE — Telephone Encounter (Signed)
 [employer]- [employer] medicare no auth req  [employer].

## 2023-07-01 ENCOUNTER — Ambulatory Visit: Payer: 59 | Admitting: Internal Medicine

## 2023-09-08 ENCOUNTER — Ambulatory Visit (INDEPENDENT_AMBULATORY_CARE_PROVIDER_SITE_OTHER): Payer: 59 | Admitting: Neurology

## 2023-09-08 DIAGNOSIS — G4734 Idiopathic sleep related nonobstructive alveolar hypoventilation: Secondary | ICD-10-CM

## 2023-09-08 DIAGNOSIS — G4720 Circadian rhythm sleep disorder, unspecified type: Secondary | ICD-10-CM

## 2023-09-08 DIAGNOSIS — R0683 Snoring: Secondary | ICD-10-CM

## 2023-09-08 DIAGNOSIS — Z9189 Other specified personal risk factors, not elsewhere classified: Secondary | ICD-10-CM

## 2023-09-08 DIAGNOSIS — G4733 Obstructive sleep apnea (adult) (pediatric): Secondary | ICD-10-CM | POA: Diagnosis not present

## 2023-09-08 DIAGNOSIS — G4761 Periodic limb movement disorder: Secondary | ICD-10-CM

## 2023-09-08 DIAGNOSIS — Z8673 Personal history of transient ischemic attack (TIA), and cerebral infarction without residual deficits: Secondary | ICD-10-CM

## 2023-09-08 DIAGNOSIS — G4719 Other hypersomnia: Secondary | ICD-10-CM

## 2023-09-08 DIAGNOSIS — R351 Nocturia: Secondary | ICD-10-CM

## 2023-09-15 NOTE — Addendum Note (Signed)
Addended by: Huston Foley on: 09/15/2023 10:46 AM   Modules accepted: Orders

## 2023-09-15 NOTE — Procedures (Signed)
Physician Interpretation:     [facility] POLYSOMNOGRAPHY  INTERPRETATION REPORT   STUDY DATE:  09/08/2023     PATIENT NAME:  [name]         DATE OF BIRTH:  [birth year]  PATIENT ID:  [id number]    TYPE OF STUDY:  PSG  READING PHYSICIAN: Huston Foley, MD, PhD   SCORING TECHNICIAN: Domingo Cocking, RPSGT  Referred by: Ihor Austin, NP ? History and Indication for Testing: 62 year old male with an underlying medical history of suspected TIA in May 2024, hypertension, hyperlipidemia, angina, diabetes, chronic kidney disease, reflux disease, headaches, osteoarthritis with status post multiple surgeries including left ulnar nerve transposition, left-sided carpal tunnel surgery, left total knee replacement, right shoulder surgery, and severe obesity with a BMI of over 40, who reports snoring and excessive daytime somnolence. His Epworth sleepiness score is 10 out of 24, fatigue severity score is 12 out of 63. Height: 75 in Weight: 327 lb (BMI 40) Neck Size: 19 in    MEDICATIONS: Tylenol, Vitamin C, Aspirin, Colace, Pepcid, Prozac, Neurontin, Jardiance, Multivitamin w/minerals, Benicar, Ozempic, Crestor, Toujeo Max Solostar, Vitamin D   TECHNICAL DESCRIPTION: A registered sleep technologist was in attendance for the duration of the recording.  Data collection, scoring, video monitoring, and reporting were performed in compliance with the AASM Manual for the Scoring of Sleep and Associated Events; (Hypopnea is scored based on the criteria listed in Section VIII D. 1b in the AASM Manual V2.6 using a 4% oxygen desaturation rule or Hypopnea is scored based on the criteria listed in Section VIII D. 1a in the AASM Manual V2.6 using 3% oxygen desaturation and /or arousal rule).   SLEEP CONTINUITY AND SLEEP ARCHITECTURE:  Lights-out was at 21:38: and lights-on at  05:15:, with a total recording time of 7 hours, 37 min. Total sleep time ( TST) was 370.0 minutes with a  decreased sleep efficiency at 81.0%. There was  4.9% REM sleep.   BODY POSITION:  TST was divided  between the following sleep positions: 41.2% supine;  58.8% lateral;  0% prone. Duration of total sleep and percent of total sleep in their respective position is as follows: supine 152 minutes (41%), non-supine 218 minutes (59%); right 137 minutes (37%), left 80 minutes (22%), and prone 00 minutes (0%).  Total supine REM sleep time was 04 minutes (22% of total REM sleep).  Sleep latency was normal at 22.5 minutes.  REM sleep latency was increased at 129.0 minutes. Of the total sleep time, the percentage of stage N1 sleep was 12.6%, which is increased, stage N2 sleep was 82%, which is markedly increased, stage N3 sleep was near-absent at 0.7%, and REM sleep was 4.9%, which is markedly reduced. Wake after sleep onset (WASO) time accounted for 64.5 minutes with mild to moderate sleep fragmentation noted.   RESPIRATORY MONITORING:   Based on CMS criteria (using a 4% oxygen desaturation rule for scoring hypopneas), there were 14 apneas (9 obstructive; 2 central; 3 mixed), and 151 hypopneas.  Apnea index was 2.3. Hypopnea index was 24.5. The apnea-hypopnea index was 26.8/hour overall (20.9 supine, 21 non-supine; 26.7 REM, 45.0 supine REM).  There were 0 respiratory effort-related arousals (RERAs).  The RERA index was 0 events/h. Total respiratory disturbance index (RDI) was 26.8 events/h. RDI results showed: supine RDI  20.9 /h; non-supine RDI 30.9 /h; REM RDI 26.7 /h, supine REM RDI 45.0 /h.   Based on AASM criteria (using a 3% oxygen desaturation and /or arousal rule  for scoring hypopneas), there were 14 apneas (9 obstructive; 2 central; 3 mixed), and 223 hypopneas. Apnea index was 2.3. Hypopnea index was 36.2. The apnea-hypopnea index was 38.4 overall (32.3 supine, 34 non-supine; 43.3 REM, 75.0 supine REM).  There were 0 respiratory effort-related arousals (RERAs).  The RERA index was 0 events/h. Total  respiratory disturbance index (RDI) was 38.4 events/h. RDI results showed: supine RDI  32.3 /h; non-supine RDI 42.8 /h; REM RDI 43.3 /h, supine REM RDI 75.0 /h.    OXIMETRY: Oxyhemoglobin Saturation Nadir during sleep was at  79% from a mean of 91%.  Of the Total sleep time (TST)   hypoxemia (=<88%) was present for  19.2 minutes, or 5.2% of total sleep time.   LIMB MOVEMENTS: There were 203 periodic limb movements of sleep (32.9/hr), of which 18 (2.9/hr) were associated with an arousal.   AROUSAL: There were 109 arousals in total, for an arousal index of 18 arousals/hour.  Of these, 52 were identified as respiratory-related arousals (8 /h), 18 were PLM-related arousals (3 /h), and 61 were non-specific arousals (10 /h).   EEG: Review of the EEG showed no abnormal electrical discharges and symmetrical bihemispheric findings.    EKG: The EKG revealed normal sinus rhythm (NSR). The average heart rate during sleep was 63 bpm.   AUDIO/VIDEO REVIEW: The audio and video review did not show any abnormal or unusual behaviors, movements, phonations or vocalizations. The patient took one restroom break. Snoring was noted, in the mild range.    POST-STUDY QUESTIONNAIRE: Post study, the patient indicated, that sleep was worse than usual.   IMPRESSION:   1. Moderate Obstructive Sleep Apnea (OSA), with nocturnal hypoxemia 2. PLMD (periodic limb movement disorder [of sleep]) 3. Dysfunctions associated with sleep stages or arousal from sleep  RECOMMENDATIONS:  1. This study demonstrates moderate obstructive sleep apnea, with a total AHI of 26.8/hour, REM AHI of 26.7/hour, supine AHI of 20.9/hour and O2 nadir of 79% (during nonsupine REM sleep); there was evidence of nocturnal hypoxemia with time below or at 88% saturation of over 15 minutes for the night. Treatment with a positive airway pressure (PAP) device is recommended.  The patient will be advised to proceed with home autoPAP therapy for now.  A  laboratory attended, full night PAP-titration study can be considered down the road, to optimize therapy settings, mask fit, monitoring of tolerance and of proper oxygen saturations. Other treatment options may be limited, and may include (generally speaking) surgical options in selected patients. Concomitant weight loss is highly recommended (where clinically feasible). Please note that untreated obstructive sleep apnea may carry additional perioperative morbidity. Patients with significant obstructive sleep apnea should receive perioperative PAP therapy and the surgeons and particularly the anesthesiologist should be informed of the diagnosis and the severity of the sleep disordered breathing. 2. Moderate PLMs (periodic limb movements of sleep) were noted during this study without significant arousals; clinical correlation is recommended. Medication effect from the antidepressant medication should be considered. PLMs may improve with OSA treatment. 3. This study shows sleep fragmentation and abnormal sleep stage percentages; these are nonspecific findings and per se do not signify an intrinsic sleep disorder or a cause for the patient's sleep-related symptoms. Causes include (but are not limited to) the first night effect of the sleep study, circadian rhythm disturbances, medication effect or an underlying mood disorder or medical problem.  4. The patient should be cautioned not to drive, work at heights, or operate dangerous or heavy equipment when tired or sleepy.  Review and reiteration of good sleep hygiene measures should be pursued with any patient. 5. The patient will be seen in follow-up in the sleep clinic at [facility] for discussion of the test results, symptom and treatment compliance review, further management strategies, etc. The patient and the referring provider will be notified of the test results. I certify that I have reviewed the entire raw data recording prior to the issuance of this report in  accordance with the Standards of Accreditation of the American Academy of Sleep Medicine (AASM).  Huston Foley, MD, PhD Medical Director, [facility]) Diplomat, ABPN (Neurology and Sleep)               Technical Report:   General Information  Name: Kelcey, Arena BMI: 65.78 Physician: Huston Foley, MD  ID: [id number] Height: 75.0 in Technician: Domingo Cocking, RPSGT  Sex: Male Weight: 327.0 lb Record: [id number]  Age: 62 [December 08, 1960] Date: 09/08/2023    Medical & Medication History    Mr. Cacciola is a 62 year old male with an underlying medical history of suspected TIA in May 2024, hypertension, hyperlipidemia, angina, diabetes, chronic kidney disease, reflux disease, headaches, osteoarthritis with status post multiple surgeries including left ulnar nerve transposition, left-sided carpal tunnel surgery, left total knee replacement, right shoulder surgery, and severe obesity with a BMI of over 40, who reports snoring and excessive daytime somnolence. His Epworth sleepiness score is 10 out of 24, fatigue severity score is 12 out of 63. He is retired, he used to be a Public librarian. He is single and lives alone, no children, no pets in the house. He has a TV in the bedroom and puts it on a 1 hour time or typically, he reports that he does not actually watch it but listens to it. Bedtime is generally around 1030 and rise time between 5 and 7 AM. He has nocturia about twice per average night and denies recurrent nocturnal or morning headaches. He is not aware of any family history of sleep apnea. He has limited his caffeine to 1 cup of coffee in the morning. He does not currently drink any alcohol, he quit smoking some 25 years ago and has not had any alcohol in about the same time. His weight has been fluctuating, he is working on weight loss by walking a little more, he has lost a few pounds thus far. He is followed by cardiology. Tylenol, Vitamin C,  Aspirin, Colace, Pepcid, Prozac, Neurontin, Jardiance, Multivitamin w/minerals, Benicar, Ozempic, Crestor, Toujeo Max Solostar, Vitamin D   Sleep Disorder      Comments   Patient arrived for a diagnostic polysomnogram. Procedure explained and all questions answered. Standard paste setup without complications. Patient slept supine, right, and left. Mild snoring heard. Respiratory events observed. After 2 hours total sleep time, AHI = 16.5. No significant cardiac arrhythmias observed. No significant PLMS observed. One restroom visit.    Lights out: 09:38:42 PM Lights on: 05:15:30 AM   Time Total Supine Side Prone Upright  Recording (TRT) 7h 37.60m 2h 55.22m 4h 41.10m 0h 0.76m 0h 0.78m  Sleep (TST) 6h 10.19m 2h 32.68m 3h 37.55m 0h 0.56m 0h 0.26m   Latency N1 N2 N3 REM Onset Per. Slp. Eff.  Actual 0h 0.13m 0h 4.49m 7h 11.37m 2h 9.67m 0h 22.57m 0h 26.12m 80.96%   Stg Dur Wake N1 N2 N3 REM  Total 87.0 46.5 303.0 2.5 18.0  Supine 23.0 12.5 136.0 0.0 4.0  Side 64.0 34.0 167.0 2.5 14.0  Prone 0.0 0.0  0.0 0.0 0.0  Upright 0.0 0.0 0.0 0.0 0.0   Stg % Wake N1 N2 N3 REM  Total 19.0 12.6 81.9 0.7 4.9  Supine 5.0 3.4 36.8 0.0 1.1  Side 14.0 9.2 45.1 0.7 3.8  Prone 0.0 0.0 0.0 0.0 0.0  Upright 0.0 0.0 0.0 0.0 0.0     Apnea Summary Sub Supine Side Prone Upright  Total 14 Total 14 9 5  0 0    REM 1 1 0 0 0    NREM 13 8 5  0 0  Obs 9 REM 1 1 0 0 0    NREM 8 6 2  0 0  Mix 3 REM 0 0 0 0 0    NREM 3 1 2  0 0  Cen 2 REM 0 0 0 0 0    NREM 2 1 1  0 0   Rera Summary Sub Supine Side Prone Upright  Total 0 Total 0 0 0 0 0    REM 0 0 0 0 0    NREM 0 0 0 0 0   Hypopnea Summary Sub Supine Side Prone Upright  Total 223 Total 223 73 150 0 0    REM 12 4 8  0 0    NREM 211 69 142 0 0   4% Hypopnea Summary Sub Supine Side Prone Upright  Total (4%) 151 Total 151 44 107 0 0    REM 7 2 5  0 0    NREM 144 42 102 0 0     AHI Total Obs Mix Cen  38.43 Apnea 2.27 1.46 0.49 0.32   Hypopnea 36.16 -- -- --  26.76 Hypopnea (4%)  24.49 -- -- --    Total Supine Side Prone Upright  Position AHI 38.43 32.26 42.76 0.00 0.00  REM AHI 43.33   NREM AHI 38.18   Position RDI 38.43 32.26 42.76 0.00 0.00  REM RDI 43.33   NREM RDI 38.18    4% Hypopnea Total Supine Side Prone Upright  Position AHI (4%) 26.76 20.85 30.90 0.00 0.00  REM AHI (4%) 26.67   NREM AHI (4%) 26.76   Position RDI (4%) 26.76 20.85 30.90 0.00 0.00  REM RDI (4%) 26.67   NREM RDI (4%) 26.76    Desaturation Information Threshold: 2% <100% <90% <80% <70% <60% <50% <40%  Supine 171.0 64.0 0.0 0.0 0.0 0.0 0.0  Side 269.0 187.0 1.0 0.0 0.0 0.0 0.0  Prone 0.0 0.0 0.0 0.0 0.0 0.0 0.0  Upright 0.0 0.0 0.0 0.0 0.0 0.0 0.0  Total 440.0 251.0 1.0 0.0 0.0 0.0 0.0  Index 60.8 34.7 0.1 0.0 0.0 0.0 0.0   Threshold: 3% <100% <90% <80% <70% <60% <50% <40%  Supine 107.0 63.0 0.0 0.0 0.0 0.0 0.0  Side 194.0 161.0 1.0 0.0 0.0 0.0 0.0  Prone 0.0 0.0 0.0 0.0 0.0 0.0 0.0  Upright 0.0 0.0 0.0 0.0 0.0 0.0 0.0  Total 301.0 224.0 1.0 0.0 0.0 0.0 0.0  Index 41.6 31.0 0.1 0.0 0.0 0.0 0.0   Threshold: 4% <100% <90% <80% <70% <60% <50% <40%  Supine 67.0 43.0 0.0 0.0 0.0 0.0 0.0  Side 127.0 112.0 1.0 0.0 0.0 0.0 0.0  Prone 0.0 0.0 0.0 0.0 0.0 0.0 0.0  Upright 0.0 0.0 0.0 0.0 0.0 0.0 0.0  Total 194.0 155.0 1.0 0.0 0.0 0.0 0.0  Index 26.8 21.4 0.1 0.0 0.0 0.0 0.0   Threshold: 3% <100% <90% <80% <70% <60% <50% <40%  Supine 107 63 0 0 0 0 0  Side 194 161 1 0 0 0 0  Prone 0 0 0 0 0 0 0  Upright 0 0 0 0 0 0 0  Total 301 224 1 0 0 0 0   Awakening/Arousal Information # of Awakenings 33  Wake after sleep onset 64.99m  Wake after persistent sleep 63.18m   Arousal Assoc. Arousals Index  Apneas 5 0.8  Hypopneas 47 7.6  Leg Movements 27 4.4  Snore 0 0.0  PTT Arousals 0 0.0  Spontaneous 62 10.1  Total 134 21.7  Leg Movement Information PLMS LMs Index  Total LMs during PLMS 203 32.9  LMs w/ Microarousals 18 2.9   LM LMs Index  w/ Microarousal 9 1.5  w/ Awakening 5  0.8  w/ Resp Event 0 0.0  Spontaneous 29 4.7  Total 38 6.2     Desaturation threshold setting: 3% Minimum desaturation setting: 10 seconds SaO2 nadir: 73% The longest event was a 23 sec obstructive Hypopnea with a minimum SaO2 of 84%. The lowest SaO2 was 80% associated with a 17 sec obstructive Hypopnea. EKG Rates EKG Avg Max Min  Awake 64 80 56  Asleep 63 80 54  EKG Events: N/A

## 2023-09-16 ENCOUNTER — Telehealth: Payer: Self-pay | Admitting: *Deleted

## 2023-09-16 NOTE — Telephone Encounter (Signed)
Unfortunately, I do not have any other recommendations for him. I would recommend he give autoPAP a try to see if he can tolerate it. We do not recommend leaving moderate or severe OSA untreated due to long term increased risk for cardiovascular and neurovascular risks, such as stroke, ministroke, heart attack, irregular heartbeat, etc. I recommend he work aggressively on weight loss and FU with PCP about options for weight loss.

## 2023-09-16 NOTE — Telephone Encounter (Signed)
I spoke with the patient and discussed his sleep results. He said he will not wear anything on his head to sleep at night. He states his sleep will be worse than it already is. I did explain to him that they have different types of masks, some smaller and less invasive than others. He still says he will not wear anything on this head. He can't even wear any sort of cap on his head to bed.

## 2023-09-16 NOTE — Telephone Encounter (Signed)
I called the patient and LVM (ok per DPR) advising him of Dr. Teofilo Pod message in detail as noted below.  I asked the patient to give [place] a call back so we can discuss.  I left our office number in the message.

## 2023-09-16 NOTE — Telephone Encounter (Signed)
-----   Message from Huston Foley sent at 09/15/2023 10:46 AM EST ----- Patient referred by [name], seen by me on 06/17/23, diagnostic PSG on 09/08/23.    Please call and notify the patient that the recent sleep study showed moderate obstructive sleep apnea. I recommend treatment for in the form of autoPAP. We may consider at a CPAP titration study at a later date, if need be, which means, that we would ask him to come back in for a second sleep study with CPAP treatment. For now, I would like to start him on a so-called autoPAP machine at home, through a DME company (of his choice, or as per insurance requirement). The DME representative will educate him on how to use the machine, how to put the mask on, etc. I have placed an order in the chart. Please send referral, talk to patient, send report to referring MD. We will need a FU in sleep clinic for 10 weeks post-PAP set up, please arrange that with me or with [name]. Thanks,   Huston Foley, MD, PhD [facility])

## 2023-09-17 NOTE — Telephone Encounter (Signed)
I called the patient back.  He is amenable to proceeding with AutoPap.  His questions were answered and we discussed the insurance compliance requirements which includes using the machine at least 4 hours at night and also being seen by our office between 30 and 90 days after set up.  I scheduled patient with [name] NP for initial follow-up on December 11, 2023 at 1:15 PM arrival 12:45 PM.  Patient is being referred to [facility] (ok per pt).  I provided him with the phone number for the office and he will be on the lookout for a call from them within 1 week.   Order sent to [facility].

## 2023-09-17 NOTE — Telephone Encounter (Signed)
[place], [name], Russell Jefferson, RN; [place], Nada Boozer; Kathe Becton; 1 other Received, thank you!

## 2023-09-17 NOTE — Telephone Encounter (Signed)
Pt returned call. Please call back when available. 

## 2023-12-10 ENCOUNTER — Telehealth: Payer: Self-pay

## 2023-12-10 NOTE — Telephone Encounter (Signed)
 Contacted pt regarding appt tomorrow due to noncompliance with CPAP. LVM rq call back. (10% last 30 days)

## 2023-12-10 NOTE — Progress Notes (Deleted)
 [facility] [address] [place]. [place] [address] [phone]       OFFICE FOLLOW UP NOTE  [name] Date of Birth:  1961/05/06 Medical Record Number:  [id number]    Primary neurologist: Dr. Frances Furbish Reason for visit: Initial CPAP follow-up    SUBJECTIVE:   CHIEF COMPLAINT:  No chief complaint on file.   Follow-up visit:  Prior visit: 06/17/2023 Dr. Frances Furbish  Brief HPI:   [name] is a 63 y.o. male who was evaluated by Dr. Frances Furbish in 06/2023 for concern of underlying sleep apnea with PMH significant for TIA in 02/2023, HTN, HLD, DM, CKD, headaches, osteoarthritis s/p multiple surgeries and severe obesity with BMI over 40 with complaints of snoring and excessive daytime somnolence.  ESS 10/24.  Sleep study 08/2023 showed moderate OSA with total AHI 26.8/h, REM AHI of 26.7/h, supine AHI of 20.9/h and O2 nadir 79% with evidence of nocturnal hypoxemia with time below or at 88% saturation of over 15 minutes.  AutoPap set up 09/2023.       Interval history:        ROS:   14 system review of systems performed and negative with exception of those listed in HPI  PMH:  Past Medical History:  Diagnosis Date   Anginal pain (HCC) 12/29/2012   CKD (chronic kidney disease) stage 3, GFR 30-59 ml/min (HCC)    Diabetes mellitus type 2, noninsulin dependent (HCC)    GERD (gastroesophageal reflux disease)    H/O hiatal hernia    Headache(784.0)    "not daily, not weekly, but very often" (12/30/2012)   HTN (hypertension)    Hyperlipidemia    Morbid obesity (HCC)    Osteoarthritis    "hands" (12/30/2012)    PSH:  Past Surgical History:  Procedure Laterality Date   CARPAL TUNNEL RELEASE Left 01/08/2018   Procedure: LEFT CARPAL TUNNEL RELEASE;  Surgeon: Cindee Salt, MD;  Location: [facility];  Service: Orthopedics;  Laterality: Left;  axillary block   ELBOW ARTHROPLASTY Left 1990's   "3 spurs; ?14 staples" (12/30/2012)   KNEE  ARTHROSCOPY WITH SUBCHONDROPLASTY Right 04/18/2021   Procedure: KNEE ARTHROSCOPY WITH PARTIAL MEDIAL AND LATERAL MENISCECTOMY AND DEBRIDEMENT,MICROFRACTURE medial tibial plateau;  Surgeon: Jene Every, MD;  Location: [facility];  Service: Orthopedics;  Laterality: Right;    LEFT HEART CATHETERIZATION WITH CORONARY ANGIOGRAM N/A 12/31/2012   Procedure: LEFT HEART CATHETERIZATION WITH CORONARY ANGIOGRAM;  Surgeon: Tonny Bollman, MD;  Location: [facility];  Service: Cardiovascular;  Laterality: N/A;   LUMBAR DISC SURGERY  2010; 2011   "2 ruptured discs; 2 pinched nerves"   REPLACEMENT TOTAL KNEE Left 08/2022   SHOULDER SURGERY Right    ULNAR NERVE TRANSPOSITION Left 01/08/2018   Procedure: DECOMPRESSION LEFT ULNAR AT ELBOW;  Surgeon: Cindee Salt, MD;  Location: [facility];  Service: Orthopedics;  Laterality: Left;  axillary block    Social History:  Social History   Socioeconomic History   Marital status: Divorced    Spouse name: Not on file   Number of children: Not on file   Years of education: Not on file   Highest education level: Not on file  Occupational History   Occupation: disabled  Tobacco Use   Smoking status: Former    Current packs/day: 0.00    Average packs/day: 1.5 packs/day for 8.0 years (12.0 ttl pk-yrs)    Types: Cigarettes    Start date: 10/14/1974    Quit date: 10/14/1982  Years since quitting: 41.1   Smokeless tobacco: Former    Types: Snuff    Quit date: 10/14/1982   Tobacco comments:    12/30/2012 "I was about 63yr old when I quit smoking cigarettes & chewing snuff"  Vaping Use   Vaping status: Never Used  Substance and Sexual Activity   Alcohol use: No   Drug use: No   Sexual activity: Not Currently  Other Topics Concern   Not on file  Social History Narrative   Not on file   Social Drivers of Health   Financial Resource Strain: Low Risk  (10/30/2023)   Received from [facility]   Overall Financial Resource Strain (CARDIA)     Difficulty of Paying Living Expenses: Not hard at all  Food Insecurity: No Food Insecurity (10/30/2023)   Received from [facility]   Hunger Vital Sign    Worried About Running Out of Food in the Last Year: Never true    Ran Out of Food in the Last Year: Never true  Transportation Needs: No Transportation Needs (10/30/2023)   Received [facility] - Transportation    Lack of Transportation (Medical): No    Lack of Transportation (Non-Medical): No  Physical Activity: Insufficiently Active (02/24/2023)   [facility]   Exercise Vital Sign    Days of Exercise per Week: 3 days    Minutes of Exercise per Session: 20 min  Stress: No Stress Concern Present (02/24/2023)   Received from [facility], [facility], [facility]   [employer] [facility] - Occupational Stress Questionnaire    Feeling of Stress : Not at all  Social Connections: Socially Integrated (02/24/2023)   [facility], [facility]   Social Network    How would you rate your social network (family, work, friends)?: Good participation with social networks  Intimate Partner Violence: Not At Risk (02/24/2023)   Received from [facility], [facility]   HITS    Over the last 12 months how often did your partner physically hurt you?: Never    Over the last 12 months how often did your partner insult you or talk down to you?: Never    Over the last 12 months how often did your partner threaten you with physical harm?: Never    Over the last 12 months how often did your partner scream or curse at you?: Never    Family History: No family history on file.  Medications:   Current Outpatient Medications on File Prior to Visit  Medication Sig Dispense Refill   acetaminophen (TYLENOL) 500 MG tablet Take 1,000 mg by mouth every 6 (six) hours as needed for mild pain.     Ascorbic Acid (VITAMIN C) 1000 MG tablet Take 1,000  mg by mouth 2 (two) times daily.     aspirin EC 81 MG tablet Take 1 tablet (81 mg total) by mouth daily. 30 tablet 1   docusate sodium (COLACE) 100 MG capsule Take 1 capsule (100 mg total) by mouth 2 (two) times daily. 10 capsule 0   famotidine (PEPCID) 20 MG tablet Take 20 mg by mouth daily.     FLUoxetine (PROZAC) 20 MG capsule Take 20 mg by mouth daily.     gabapentin (NEURONTIN) 100 MG capsule Take 100 mg by mouth daily.     JARDIANCE 25 MG TABS tablet Take 25 mg by mouth daily.     Multiple Vitamin (MULTIVITAMIN  WITH MINERALS) TABS tablet Take 1 tablet by mouth daily.     olmesartan (BENICAR) 20 MG tablet Take 20 mg by mouth daily.     OZEMPIC, 2 MG/DOSE, 8 MG/3ML SOPN Inject 2 mg into the skin every Friday.     rosuvastatin (CRESTOR) 20 MG tablet Take 20 mg by mouth daily.     TOUJEO MAX SOLOSTAR 300 UNIT/ML Solostar Pen Inject 34 Units into the skin 2 (two) times daily.     Vitamin D, Ergocalciferol, (DRISDOL) 1.25 MG (50000 UNIT) CAPS capsule Take 50,000 Units by mouth every Monday.     No current facility-administered medications on file prior to visit.    Allergies:   Allergies  Allergen Reactions   Quinolones Rash    Other reaction(s): Other (See Comments) "my mouth broke out" "my mouth broke out"    Tequin [Gatifloxacin] Other (See Comments)    "my mouth broke out"   Metformin And Related Nausea Only      OBJECTIVE:  Physical Exam  There were no vitals filed for this visit. There is no height or weight on file to calculate BMI. No results found.   General: well developed, well nourished, seated, in no evident distress Head: head normocephalic and atraumatic.   Neck: supple with no carotid or supraclavicular bruits Cardiovascular: regular rate and rhythm, no murmurs Musculoskeletal: no deformity Skin:  no rash/petichiae Vascular:  Normal pulses all extremities   Neurologic Exam Mental Status: Awake and fully alert. Oriented to place and time. Recent and  remote memory intact. Attention span, concentration and fund of knowledge appropriate. Mood and affect appropriate.  Cranial Nerves: Pupils equal, briskly reactive to light. Extraocular movements full without nystagmus. Visual fields full to confrontation. Hearing intact. Facial sensation intact. Face, tongue, palate moves normally and symmetrically.  Motor: Normal bulk and tone. Normal strength in all tested extremity muscles Gait and Station: Arises from chair without difficulty. Stance is normal. Gait demonstrates normal stride length and balance without use of AD. Tandem walk and heel toe without difficulty.         ASSESSMENT/PLAN: [name] is a 63 y.o. year old male    OSA on CPAP : Compliance report shows satisfactory usage with optimal residual AHI.  Continue current pressure settings.  Discussed continued nightly usage with ensuring greater than 4 hours nightly for optimal benefit and per insurance purposes.  Continue to follow with DME company for any needed supplies or CPAP related concerns     Follow up in *** or call earlier if needed   CC:  PCP: Corrington, Kip A, MD    I spent *** minutes of face-to-face and non-face-to-face time with patient.  This included previsit chart review, lab review, study review, order entry, electronic health record documentation, patient education and discussion regarding above diagnoses and treatment plan and answered all other questions to patient's satisfaction    Ihor Austin, [facility]  [facility] [address] [place], [place] [address]  Phone [phone] Fax [phone] Note: This document was prepared with digital dictation and possible smart phrase technology. Any transcriptional errors that result from this process are unintentional.

## 2023-12-11 ENCOUNTER — Ambulatory Visit: Payer: 59 | Admitting: Adult Health

## 2023-12-11 ENCOUNTER — Encounter: Payer: Self-pay | Admitting: Adult Health

## 2024-01-30 ENCOUNTER — Emergency Department (HOSPITAL_COMMUNITY)

## 2024-01-30 ENCOUNTER — Emergency Department (HOSPITAL_COMMUNITY)
Admission: EM | Admit: 2024-01-30 | Discharge: 2024-01-30 | Disposition: A | Discharge status: Home / Self Care | Attending: Emergency Medicine | Admitting: Emergency Medicine

## 2024-01-30 ENCOUNTER — Encounter (HOSPITAL_COMMUNITY): Payer: Self-pay | Admitting: Emergency Medicine

## 2024-01-30 ENCOUNTER — Other Ambulatory Visit: Payer: Self-pay

## 2024-01-30 DIAGNOSIS — R109 Unspecified abdominal pain: Secondary | ICD-10-CM | POA: Diagnosis not present

## 2024-01-30 DIAGNOSIS — Z7982 Long term (current) use of aspirin: Secondary | ICD-10-CM | POA: Insufficient documentation

## 2024-01-30 DIAGNOSIS — I2510 Atherosclerotic heart disease of native coronary artery without angina pectoris: Secondary | ICD-10-CM | POA: Diagnosis not present

## 2024-01-30 DIAGNOSIS — I700 Atherosclerosis of aorta: Secondary | ICD-10-CM | POA: Insufficient documentation

## 2024-01-30 DIAGNOSIS — R079 Chest pain, unspecified: Secondary | ICD-10-CM | POA: Diagnosis present

## 2024-01-30 DIAGNOSIS — R0789 Other chest pain: Secondary | ICD-10-CM | POA: Diagnosis not present

## 2024-01-30 LAB — URINALYSIS, ROUTINE W REFLEX MICROSCOPIC
Bacteria, UA: NONE SEEN
Bilirubin Urine: NEGATIVE
Glucose, UA: >=500 mg/dL — AB
Hgb urine dipstick: NEGATIVE
Ketones, ur: NEGATIVE mg/dL
Leukocytes,Ua: NEGATIVE
Nitrite: NEGATIVE
Protein, ur: NEGATIVE mg/dL
Specific Gravity, Urine: 1.022 (ref 1.005–1.030)
pH: 5 (ref 5.0–8.0)

## 2024-01-30 LAB — CBC WITH DIFFERENTIAL/PLATELET
Abs Immature Granulocytes: 0.02 10*3/uL (ref 0.00–0.07)
Basophils Absolute: 0.1 10*3/uL (ref 0.0–0.1)
Basophils Relative: 1 %
Eosinophils Absolute: 0.2 10*3/uL (ref 0.0–0.5)
Eosinophils Relative: 2 %
HCT: 43.5 % (ref 39.0–52.0)
Hemoglobin: 14.7 g/dL (ref 13.0–17.0)
Immature Granulocytes: 0 %
Lymphocytes Relative: 18 %
Lymphs Abs: 1.5 10*3/uL (ref 0.7–4.0)
MCH: 31.5 pg (ref 26.0–34.0)
MCHC: 33.8 g/dL (ref 30.0–36.0)
MCV: 93.1 fL (ref 80.0–100.0)
Monocytes Absolute: 0.7 10*3/uL (ref 0.1–1.0)
Monocytes Relative: 9 %
Neutro Abs: 5.9 10*3/uL (ref 1.7–7.7)
Neutrophils Relative %: 70 %
Platelets: 233 10*3/uL (ref 150–400)
RBC: 4.67 MIL/uL (ref 4.22–5.81)
RDW: 13.4 % (ref 11.5–15.5)
WBC: 8.5 10*3/uL (ref 4.0–10.5)
nRBC: 0 % (ref 0.0–0.2)

## 2024-01-30 LAB — COMPREHENSIVE METABOLIC PANEL WITH GFR
ALT: 19 U/L (ref 0–44)
AST: 21 U/L (ref 15–41)
Albumin: 3.6 g/dL (ref 3.5–5.0)
Alkaline Phosphatase: 100 U/L (ref 38–126)
Anion gap: 5 (ref 5–15)
BUN: 18 mg/dL (ref 8–23)
CO2: 24 mmol/L (ref 22–32)
Calcium: 9.3 mg/dL (ref 8.9–10.3)
Chloride: 108 mmol/L (ref 98–111)
Creatinine, Ser: 1.24 mg/dL (ref 0.61–1.24)
GFR, Estimated: >60 mL/min (ref >60)
Glucose, Bld: 113 mg/dL — ABNORMAL HIGH (ref 70–99)
Potassium: 4.4 mmol/L (ref 3.5–5.1)
Sodium: 137 mmol/L (ref 135–145)
Total Bilirubin: 0.5 mg/dL (ref 0.0–1.2)
Total Protein: 7 g/dL (ref 6.5–8.1)

## 2024-01-30 LAB — D-DIMER, QUANTITATIVE: D-Dimer, Quant: <0.27 ug{FEU}/mL (ref 0.00–0.50)

## 2024-01-30 MED ORDER — IBUPROFEN 800 MG PO TABS: 800.0000 mg | ORAL_TABLET | Freq: Three times a day (TID) | ORAL | 1 refills | Status: AC | PRN

## 2024-01-30 NOTE — Discharge Instructions (Signed)
 Follow up with your md next week for recheck

## 2024-01-30 NOTE — ED Triage Notes (Signed)
 Pt c/o CP L side that is sharp and constant in nature, rates pain 10/10, states it has been going on x Wednesday. Denies shob, endorses dizziness "sometimes"

## 2024-02-03 NOTE — ED Provider Notes (Signed)
 [place] EMERGENCY DEPARTMENT AT [facility] Provider Note   CSN: [id number] Arrival date & time: 01/30/24  7253     History  Chief Complaint  Patient presents with   Chest Pain    [name] is a 63 y.o. male.  Pt. Complains of a sharp left sided chest pain since wed.    The history is provided by the patient. No language interpreter was used.  Chest Pain Pain location:  L chest Pain quality: aching   Pain radiates to:  Does not radiate Pain severity:  Moderate Onset quality:  Sudden Timing:  Constant Progression:  Worsening Chronicity:  New Context: not breathing   Relieved by:  Nothing Worsened by:  Nothing Associated symptoms: no abdominal pain, no back pain, no cough, no fatigue and no headache   Pt.     Home Medications Prior to Admission medications   Medication Sig Start Date End Date Taking? Authorizing Provider  ibuprofen  (ADVIL ) 800 MG tablet Take 1 tablet (800 mg total) by mouth every 8 (eight) hours as needed for moderate pain (pain score 4-6). 01/30/24  Yes Cheyenne Cotta, MD  acetaminophen  (TYLENOL ) 500 MG tablet Take 1,000 mg by mouth every 6 (six) hours as needed for mild pain.    [provider]  Ascorbic Acid  (VITAMIN C ) 1000 MG tablet Take 1,000 mg by mouth 2 (two) times daily.    [provider]  aspirin  EC 81 MG tablet Take 1 tablet (81 mg total) by mouth daily. 04/19/21   Orvan Blanch, MD  docusate sodium  (COLACE) 100 MG capsule Take 1 capsule (100 mg total) by mouth 2 (two) times daily. 04/18/21   Orvan Blanch, MD  famotidine  (PEPCID ) 20 MG tablet Take 20 mg by mouth daily. 08/17/20   [provider]  FLUoxetine  (PROZAC ) 20 MG capsule Take 20 mg by mouth daily.    [provider]  gabapentin  (NEURONTIN ) 100 MG capsule Take 100 mg by mouth daily. 03/01/20   [provider]  JARDIANCE  25 MG TABS tablet Take 25 mg by mouth daily. 03/14/21   [provider]  Multiple Vitamin  (MULTIVITAMIN WITH MINERALS) TABS tablet Take 1 tablet by mouth daily.    [provider]  olmesartan (BENICAR) 20 MG tablet Take 20 mg by mouth daily.    [provider]  OZEMPIC, 2 MG/DOSE, 8 MG/3ML SOPN Inject 2 mg into the skin every Friday. 04/10/21   [provider]  rosuvastatin  (CRESTOR ) 20 MG tablet Take 20 mg by mouth daily.    [provider]  TOUJEO  MAX SOLOSTAR 300 UNIT/ML Solostar Pen Inject 34 Units into the skin 2 (two) times daily. 02/22/21   [provider]  Vitamin D , Ergocalciferol , (DRISDOL ) 1.25 MG (50000 UNIT) CAPS capsule Take 50,000 Units by mouth every Monday.    [provider]      Allergies    Quinolones, Tequin [gatifloxacin], and Metformin  and related    Review of Systems   Review of Systems  Constitutional:  Negative for appetite change and fatigue.  HENT:  Negative for congestion, ear discharge and sinus pressure.   Eyes:  Negative for discharge.  Respiratory:  Negative for cough.   Cardiovascular:  Positive for chest pain.  Gastrointestinal:  Negative for abdominal pain and diarrhea.  Genitourinary:  Negative for frequency and hematuria.  Musculoskeletal:  Negative for back pain.  Skin:  Negative for rash.  Neurological:  Negative for seizures and headaches.  Psychiatric/Behavioral:  Negative for  hallucinations.     Physical Exam Updated Vital Signs BP 117/80   Pulse 62   Temp 97.7 F (36.5 C) (Oral)   Resp 14   Ht 6\' 3"  (1.905 m)   Wt (!) 148 kg   SpO2 97%   BMI 40.78 kg/m  Physical Exam Vitals and nursing note reviewed.  Constitutional:      Appearance: He is well-developed.  HENT:     Head: Normocephalic.     Nose: Nose normal.  Eyes:     General: No scleral icterus.    Conjunctiva/sclera: Conjunctivae normal.  Neck:     Thyroid: No thyromegaly.  Cardiovascular:     Rate and Rhythm: Normal rate and regular rhythm.     Heart sounds: No murmur heard.    No friction rub. No  gallop.  Pulmonary:     Breath sounds: No stridor. No wheezing or rales.  Chest:     Chest wall: No tenderness.  Abdominal:     General: There is no distension.     Tenderness: There is no abdominal tenderness. There is no rebound.  Musculoskeletal:        General: Normal range of motion.     Cervical back: Neck supple.  Lymphadenopathy:     Cervical: No cervical adenopathy.  Skin:    Findings: No erythema or rash.  Neurological:     Mental Status: He is alert and oriented to person, place, and time.     Motor: No abnormal muscle tone.     Coordination: Coordination normal.  Psychiatric:        Behavior: Behavior normal.     ED Results / Procedures / Treatments   Labs (all labs ordered are listed, but only abnormal results are displayed) Labs Reviewed  COMPREHENSIVE METABOLIC PANEL WITH GFR - Abnormal; Notable for the following components:      Result Value   Glucose, Bld 113 (*)    All other components within normal limits  URINALYSIS, ROUTINE W REFLEX MICROSCOPIC - Abnormal; Notable for the following components:   Glucose, UA >=500 (*)    All other components within normal limits  CBC WITH DIFFERENTIAL/PLATELET  D-DIMER, QUANTITATIVE    EKG EKG Interpretation Date/Time:  Friday January 30 2024 09:14:01 EDT Ventricular Rate:  61 PR Interval:  196 QRS Duration:  92 QT Interval:  381 QTC Calculation: 384 R Axis:   6  Text Interpretation: Sinus rhythm Low voltage, precordial leads Confirmed by Cheyenne Cotta [phone]) on 01/30/2024 9:38:45 AM  Radiology No results found.  Procedures Procedures    Medications Ordered in ED Medications - No data to display  ED Course/ Medical Decision Making/ A&P                                 Medical Decision Making Amount and/or Complexity of Data Reviewed Labs: ordered. Radiology: ordered.  Risk Prescription drug management.   Pt with atypical chest pain and negative troponin.   Negative d-dimer.  Ct renal neg.  Pt  to follow up with pcp        Final Clinical Impression(s) / ED Diagnoses Final diagnoses:  Atypical chest pain    Rx / DC Orders ED Discharge Orders          Ordered    ibuprofen  (ADVIL ) 800 MG tablet  Every 8 hours PRN        01/30/24 1511  Cheyenne Cotta, MD 02/03/24 1120

## 2024-04-19 ENCOUNTER — Other Ambulatory Visit: Payer: Self-pay

## 2024-04-19 ENCOUNTER — Emergency Department (HOSPITAL_COMMUNITY)
Admission: EM | Admit: 2024-04-19 | Discharge: 2024-04-19 | Discharge status: Against Medical Advice | Source: Ambulatory Visit | Attending: Emergency Medicine | Admitting: Emergency Medicine

## 2024-04-19 ENCOUNTER — Encounter (HOSPITAL_COMMUNITY): Payer: Self-pay

## 2024-04-19 DIAGNOSIS — Z5321 Procedure and treatment not carried out due to patient leaving prior to being seen by health care provider: Secondary | ICD-10-CM | POA: Diagnosis not present

## 2024-04-19 DIAGNOSIS — K625 Hemorrhage of anus and rectum: Secondary | ICD-10-CM | POA: Diagnosis not present

## 2024-04-19 DIAGNOSIS — R1030 Lower abdominal pain, unspecified: Secondary | ICD-10-CM | POA: Diagnosis present

## 2024-04-19 LAB — COMPREHENSIVE METABOLIC PANEL WITH GFR
ALT: 19 U/L (ref 0–44)
AST: 22 U/L (ref 15–41)
Albumin: 3.8 g/dL (ref 3.5–5.0)
Alkaline Phosphatase: 103 U/L (ref 38–126)
Anion gap: 11 (ref 5–15)
BUN: 16 mg/dL (ref 8–23)
CO2: 22 mmol/L (ref 22–32)
Calcium: 9.6 mg/dL (ref 8.9–10.3)
Chloride: 105 mmol/L (ref 98–111)
Creatinine, Ser: 1.19 mg/dL (ref 0.61–1.24)
GFR, Estimated: >60 mL/min (ref >60)
Glucose, Bld: 92 mg/dL (ref 70–99)
Potassium: 4.1 mmol/L (ref 3.5–5.1)
Sodium: 138 mmol/L (ref 135–145)
Total Bilirubin: 0.9 mg/dL (ref 0.0–1.2)
Total Protein: 6.9 g/dL (ref 6.5–8.1)

## 2024-04-19 LAB — CBC WITH DIFFERENTIAL/PLATELET
Abs Immature Granulocytes: 0.02 K/uL (ref 0.00–0.07)
Basophils Absolute: 0.1 K/uL (ref 0.0–0.1)
Basophils Relative: 1 %
Eosinophils Absolute: 0.1 K/uL (ref 0.0–0.5)
Eosinophils Relative: 2 %
HCT: 44.4 % (ref 39.0–52.0)
Hemoglobin: 15 g/dL (ref 13.0–17.0)
Immature Granulocytes: 0 %
Lymphocytes Relative: 20 %
Lymphs Abs: 1.7 K/uL (ref 0.7–4.0)
MCH: 31.2 pg (ref 26.0–34.0)
MCHC: 33.8 g/dL (ref 30.0–36.0)
MCV: 92.3 fL (ref 80.0–100.0)
Monocytes Absolute: 0.8 K/uL (ref 0.1–1.0)
Monocytes Relative: 10 %
Neutro Abs: 6 K/uL (ref 1.7–7.7)
Neutrophils Relative %: 67 %
Platelets: 210 K/uL (ref 150–400)
RBC: 4.81 MIL/uL (ref 4.22–5.81)
RDW: 13.2 % (ref 11.5–15.5)
WBC: 8.8 K/uL (ref 4.0–10.5)
nRBC: 0 % (ref 0.0–0.2)

## 2024-04-19 NOTE — ED Triage Notes (Signed)
 Pt arrived via POV c/o lower abdominal pain and hematochezia that Pt reports he noticed this morning. Pt reports he did not take his daily medications today and reports he does take a blood thinner.
